# Patient Record
Sex: Female | Born: 1935 | Race: White | Hispanic: No | Smoking: Never smoker
Health system: Southern US, Community
[De-identification: ages and names within clinical notes are randomized; demographics above are authoritative.]

## PROBLEM LIST (undated history)

## (undated) DIAGNOSIS — C189 Malignant neoplasm of colon, unspecified: Secondary | ICD-10-CM

## (undated) DIAGNOSIS — H409 Unspecified glaucoma: Secondary | ICD-10-CM

## (undated) DIAGNOSIS — I639 Cerebral infarction, unspecified: Secondary | ICD-10-CM

## (undated) DIAGNOSIS — H3530 Unspecified macular degeneration: Secondary | ICD-10-CM

## (undated) DIAGNOSIS — C44311 Basal cell carcinoma of skin of nose: Secondary | ICD-10-CM

## (undated) DIAGNOSIS — M4800 Spinal stenosis, site unspecified: Secondary | ICD-10-CM

## (undated) DIAGNOSIS — C44729 Squamous cell carcinoma of skin of left lower limb, including hip: Secondary | ICD-10-CM

## (undated) HISTORY — PX: BREAST BIOPSY: SHX20

## (undated) HISTORY — DX: Basal cell carcinoma of skin of nose: C44.311

## (undated) HISTORY — DX: Squamous cell carcinoma of skin of left lower limb, including hip: C44.729

## (undated) HISTORY — DX: Spinal stenosis, site unspecified: M48.00

## (undated) HISTORY — DX: Cerebral infarction, unspecified: I63.9

## (undated) HISTORY — PX: TONSILLECTOMY: SUR1361

---

## 1998-02-03 ENCOUNTER — Ambulatory Visit (HOSPITAL_COMMUNITY): Admission: RE | Admit: 1998-02-03 | Discharge: 1998-02-03 | Payer: Self-pay | Admitting: Obstetrics and Gynecology

## 1998-04-20 ENCOUNTER — Ambulatory Visit (HOSPITAL_COMMUNITY): Admission: RE | Admit: 1998-04-20 | Discharge: 1998-04-20 | Payer: Self-pay | Admitting: Obstetrics & Gynecology

## 1998-10-04 ENCOUNTER — Other Ambulatory Visit: Admission: RE | Admit: 1998-10-04 | Discharge: 1998-10-04 | Payer: Self-pay | Admitting: Obstetrics and Gynecology

## 1999-03-01 ENCOUNTER — Encounter (INDEPENDENT_AMBULATORY_CARE_PROVIDER_SITE_OTHER): Payer: Self-pay

## 1999-03-01 ENCOUNTER — Ambulatory Visit (HOSPITAL_COMMUNITY): Admission: RE | Admit: 1999-03-01 | Discharge: 1999-03-01 | Payer: Self-pay | Admitting: *Deleted

## 1999-10-10 ENCOUNTER — Other Ambulatory Visit: Admission: RE | Admit: 1999-10-10 | Discharge: 1999-10-10 | Payer: Self-pay | Admitting: Obstetrics and Gynecology

## 1999-12-05 ENCOUNTER — Encounter: Admission: RE | Admit: 1999-12-05 | Discharge: 1999-12-05 | Payer: Self-pay | Admitting: Obstetrics and Gynecology

## 1999-12-05 ENCOUNTER — Encounter: Payer: Self-pay | Admitting: Obstetrics and Gynecology

## 2000-12-10 ENCOUNTER — Other Ambulatory Visit: Admission: RE | Admit: 2000-12-10 | Discharge: 2000-12-10 | Payer: Self-pay | Admitting: Obstetrics and Gynecology

## 2000-12-20 ENCOUNTER — Encounter: Payer: Self-pay | Admitting: Obstetrics and Gynecology

## 2000-12-20 ENCOUNTER — Encounter: Admission: RE | Admit: 2000-12-20 | Discharge: 2000-12-20 | Payer: Self-pay | Admitting: Obstetrics and Gynecology

## 2001-03-01 ENCOUNTER — Encounter: Payer: Self-pay | Admitting: Specialist

## 2001-03-04 ENCOUNTER — Ambulatory Visit (HOSPITAL_COMMUNITY): Admission: RE | Admit: 2001-03-04 | Discharge: 2001-03-04 | Payer: Self-pay | Admitting: Specialist

## 2001-09-09 ENCOUNTER — Encounter: Payer: Self-pay | Admitting: Emergency Medicine

## 2001-09-09 ENCOUNTER — Emergency Department (HOSPITAL_COMMUNITY): Admission: EM | Admit: 2001-09-09 | Discharge: 2001-09-09 | Payer: Self-pay | Admitting: Emergency Medicine

## 2002-01-27 ENCOUNTER — Encounter: Payer: Self-pay | Admitting: Obstetrics and Gynecology

## 2002-01-27 ENCOUNTER — Encounter: Admission: RE | Admit: 2002-01-27 | Discharge: 2002-01-27 | Payer: Self-pay | Admitting: Obstetrics and Gynecology

## 2002-03-11 ENCOUNTER — Other Ambulatory Visit: Admission: RE | Admit: 2002-03-11 | Discharge: 2002-03-11 | Payer: Self-pay | Admitting: Obstetrics and Gynecology

## 2003-02-18 ENCOUNTER — Encounter: Payer: Self-pay | Admitting: Obstetrics and Gynecology

## 2003-02-18 ENCOUNTER — Encounter: Admission: RE | Admit: 2003-02-18 | Discharge: 2003-02-18 | Payer: Self-pay | Admitting: Obstetrics and Gynecology

## 2003-03-30 ENCOUNTER — Encounter: Admission: RE | Admit: 2003-03-30 | Discharge: 2003-03-30 | Payer: Self-pay | Admitting: Internal Medicine

## 2003-04-13 ENCOUNTER — Other Ambulatory Visit: Admission: RE | Admit: 2003-04-13 | Discharge: 2003-04-13 | Payer: Self-pay | Admitting: Obstetrics and Gynecology

## 2003-05-15 ENCOUNTER — Encounter: Admission: RE | Admit: 2003-05-15 | Discharge: 2003-05-15 | Payer: Self-pay | Admitting: Obstetrics and Gynecology

## 2004-03-31 ENCOUNTER — Encounter: Admission: RE | Admit: 2004-03-31 | Discharge: 2004-03-31 | Payer: Self-pay | Admitting: Obstetrics and Gynecology

## 2004-04-11 ENCOUNTER — Encounter: Admission: RE | Admit: 2004-04-11 | Discharge: 2004-04-11 | Payer: Self-pay | Admitting: Obstetrics and Gynecology

## 2004-05-11 ENCOUNTER — Other Ambulatory Visit: Admission: RE | Admit: 2004-05-11 | Discharge: 2004-05-11 | Payer: Self-pay | Admitting: Obstetrics and Gynecology

## 2004-12-07 ENCOUNTER — Encounter: Admission: RE | Admit: 2004-12-07 | Discharge: 2004-12-07 | Payer: Self-pay | Admitting: Internal Medicine

## 2004-12-14 ENCOUNTER — Encounter: Admission: RE | Admit: 2004-12-14 | Discharge: 2004-12-14 | Payer: Self-pay | Admitting: Internal Medicine

## 2005-02-23 ENCOUNTER — Encounter: Admission: RE | Admit: 2005-02-23 | Discharge: 2005-02-23 | Payer: Self-pay | Admitting: Internal Medicine

## 2005-05-02 ENCOUNTER — Encounter: Admission: RE | Admit: 2005-05-02 | Discharge: 2005-05-02 | Payer: Self-pay | Admitting: Internal Medicine

## 2005-08-11 ENCOUNTER — Other Ambulatory Visit: Admission: RE | Admit: 2005-08-11 | Discharge: 2005-08-11 | Payer: Self-pay | Admitting: Obstetrics and Gynecology

## 2006-06-12 ENCOUNTER — Encounter: Admission: RE | Admit: 2006-06-12 | Discharge: 2006-06-12 | Payer: Self-pay | Admitting: Obstetrics and Gynecology

## 2007-02-12 ENCOUNTER — Encounter: Admission: RE | Admit: 2007-02-12 | Discharge: 2007-02-12 | Payer: Self-pay | Admitting: Internal Medicine

## 2007-03-27 ENCOUNTER — Other Ambulatory Visit: Admission: RE | Admit: 2007-03-27 | Discharge: 2007-03-27 | Payer: Self-pay | Admitting: Obstetrics and Gynecology

## 2007-07-21 ENCOUNTER — Encounter: Admission: RE | Admit: 2007-07-21 | Discharge: 2007-07-21 | Payer: Self-pay | Admitting: Surgery

## 2008-01-20 ENCOUNTER — Other Ambulatory Visit: Admission: RE | Admit: 2008-01-20 | Discharge: 2008-01-20 | Payer: Self-pay | Admitting: Obstetrics and Gynecology

## 2008-04-09 ENCOUNTER — Ambulatory Visit: Payer: Self-pay | Admitting: Internal Medicine

## 2008-04-14 ENCOUNTER — Encounter: Admission: RE | Admit: 2008-04-14 | Discharge: 2008-04-14 | Payer: Self-pay | Admitting: Obstetrics and Gynecology

## 2009-05-05 ENCOUNTER — Encounter: Admission: RE | Admit: 2009-05-05 | Discharge: 2009-05-05 | Payer: Self-pay | Admitting: Obstetrics and Gynecology

## 2010-06-03 ENCOUNTER — Encounter
Admission: RE | Admit: 2010-06-03 | Discharge: 2010-06-03 | Payer: Self-pay | Source: Home / Self Care | Attending: Obstetrics and Gynecology | Admitting: Obstetrics and Gynecology

## 2011-05-12 ENCOUNTER — Other Ambulatory Visit: Payer: Self-pay | Admitting: Obstetrics and Gynecology

## 2011-05-12 DIAGNOSIS — Z1231 Encounter for screening mammogram for malignant neoplasm of breast: Secondary | ICD-10-CM

## 2011-06-19 ENCOUNTER — Ambulatory Visit: Payer: Self-pay

## 2011-06-29 ENCOUNTER — Ambulatory Visit: Payer: Self-pay

## 2011-07-13 ENCOUNTER — Ambulatory Visit: Payer: Self-pay

## 2012-04-11 ENCOUNTER — Ambulatory Visit
Admission: RE | Admit: 2012-04-11 | Discharge: 2012-04-11 | Disposition: A | Discharge status: Home / Self Care | Payer: Medicare Other | Source: Ambulatory Visit | Attending: Obstetrics and Gynecology | Admitting: Obstetrics and Gynecology

## 2012-04-11 DIAGNOSIS — Z1231 Encounter for screening mammogram for malignant neoplasm of breast: Secondary | ICD-10-CM

## 2012-09-09 ENCOUNTER — Ambulatory Visit: Payer: Medicare Other | Admitting: Physical Therapy

## 2013-04-09 ENCOUNTER — Ambulatory Visit: Payer: Medicare Other | Attending: Internal Medicine | Admitting: Rehabilitation

## 2013-04-09 DIAGNOSIS — IMO0001 Reserved for inherently not codable concepts without codable children: Secondary | ICD-10-CM | POA: Insufficient documentation

## 2013-04-09 DIAGNOSIS — R42 Dizziness and giddiness: Secondary | ICD-10-CM | POA: Insufficient documentation

## 2013-04-09 DIAGNOSIS — M6281 Muscle weakness (generalized): Secondary | ICD-10-CM | POA: Insufficient documentation

## 2013-04-14 ENCOUNTER — Ambulatory Visit: Payer: Medicare Other | Admitting: Rehabilitation

## 2013-04-16 ENCOUNTER — Ambulatory Visit: Payer: Medicare Other | Admitting: Rehabilitation

## 2013-04-21 ENCOUNTER — Ambulatory Visit: Payer: Medicare Other | Admitting: Rehabilitation

## 2013-04-23 ENCOUNTER — Ambulatory Visit: Payer: Medicare Other | Admitting: Rehabilitation

## 2013-04-28 ENCOUNTER — Ambulatory Visit: Payer: Medicare Other | Admitting: Rehabilitation

## 2013-04-29 ENCOUNTER — Ambulatory Visit: Payer: Medicare Other | Admitting: Rehabilitation

## 2013-05-06 ENCOUNTER — Ambulatory Visit: Payer: Medicare Other | Attending: Internal Medicine | Admitting: Rehabilitation

## 2013-05-06 DIAGNOSIS — R42 Dizziness and giddiness: Secondary | ICD-10-CM | POA: Insufficient documentation

## 2013-05-06 DIAGNOSIS — IMO0001 Reserved for inherently not codable concepts without codable children: Secondary | ICD-10-CM | POA: Insufficient documentation

## 2013-05-06 DIAGNOSIS — M6281 Muscle weakness (generalized): Secondary | ICD-10-CM | POA: Insufficient documentation

## 2013-05-12 ENCOUNTER — Ambulatory Visit: Payer: Medicare Other | Admitting: Rehabilitation

## 2013-05-14 ENCOUNTER — Ambulatory Visit: Payer: Medicare Other | Admitting: Rehabilitation

## 2013-08-05 ENCOUNTER — Ambulatory Visit: Payer: Self-pay | Admitting: Gynecology

## 2013-08-05 ENCOUNTER — Ambulatory Visit: Payer: Self-pay | Admitting: Obstetrics and Gynecology

## 2013-08-19 ENCOUNTER — Encounter: Payer: Self-pay | Admitting: Obstetrics and Gynecology

## 2013-08-20 ENCOUNTER — Ambulatory Visit: Payer: Self-pay | Admitting: Gynecology

## 2013-09-01 ENCOUNTER — Encounter: Payer: Self-pay | Admitting: Gynecology

## 2013-09-01 ENCOUNTER — Ambulatory Visit (INDEPENDENT_AMBULATORY_CARE_PROVIDER_SITE_OTHER): Payer: Medicare Other | Admitting: Gynecology

## 2013-09-01 VITALS — BP 138/78 | HR 80 | Resp 14 | Ht 61.0 in | Wt 130.0 lb

## 2013-09-01 DIAGNOSIS — Z01419 Encounter for gynecological examination (general) (routine) without abnormal findings: Secondary | ICD-10-CM

## 2013-09-01 NOTE — Progress Notes (Signed)
80 y.o. Widowed Caucasian female   G3P3003 here for annual exam.  She does not report post-menopasual bleeding.  Not sexually active.  Pt reports rash under breasts in past has had 3x, use, used -zeasorb with good results-recommend by Dr Ronnald Ramp.  Pt reports 2 bumps on labia, itches wear hair is, using otc lotion.    No LMP recorded.          Sexually active: no  The current method of family planning is post menopausal status.    Exercising: yes  Physical Therapy Sporadically Last pap: 03/08/13 Negative  Abnormal PAP: no Mammogram: 04/12/12 Bi-Rads 1 BSE: yes mostly  Colonoscopy: 2010 Normal  DEXA: 04/2008 -1.7/2.5  Alcohol: glass of wine occasionally   Tobacco: no  Labs: RichardPang, MD   Health Maintenance  Topic Date Due  . Tetanus/tdap  07/13/1946  . Colonoscopy  07/13/1977  . Zostavax  07/14/1987  . Pneumococcal Polysaccharide Vaccine Age 6 And Over  07/13/1992  . Influenza Vaccine  01/03/2013    Family History  Problem Relation Age of Onset  . Breast cancer Maternal Aunt   . Ovarian cancer Sister   . Heart disease Father   . Ulcers Father     There are no active problems to display for this patient.   Past Medical History  Diagnosis Date  . Basal cell carcinoma of nose 2005  . Squamous cell carcinoma of left hip 2005  . Hyperchloremia     Past Surgical History  Procedure Laterality Date  . Breast biopsy  1950's ; 1952 ; 1954    x 3 - Benign    Allergies: Review of patient's allergies indicates no known allergies.  Current Outpatient Prescriptions  Medication Sig Dispense Refill  . ALPRAZolam (XANAX) 0.25 MG tablet       . aspirin 81 MG tablet Take 81 mg by mouth daily.      Marland Kitchen CALCIUM PO Take by mouth.      . cholecalciferol (VITAMIN D) 1000 UNITS tablet Take 1,000 Units by mouth daily.      . cycloSPORINE (RESTASIS) 0.05 % ophthalmic emulsion 1 drop 2 (two) times daily.      Marland Kitchen ibandronate (BONIVA) 150 MG tablet Take 150 mg by mouth every 30 (thirty)  days. Take in the morning with a full glass of water, on an empty stomach, and do not take anything else by mouth or lie down for the next 30 min.      Marland Kitchen losartan-hydrochlorothiazide (HYZAAR) 100-12.5 MG per tablet       . Meloxicam (MOBIC PO) Take by mouth.      . metoprolol tartrate (LOPRESSOR) 25 MG tablet       . Multiple Vitamins-Minerals (EYE VITAMINS PO) Take by mouth.      . Omega-3 Fatty Acids (FISH OIL PO) Take by mouth.      . traMADol (ULTRAM) 50 MG tablet        No current facility-administered medications for this visit.    ROS: Pertinent items are noted in HPI.  Exam:    BP 138/78  Pulse 80  Resp 14  Ht 5\' 1"  (1.549 m)  Wt 130 lb (58.968 kg)  BMI 24.58 kg/m2 Weight change: @WEIGHTCHANGE @ Last 3 height recordings:  Ht Readings from Last 3 Encounters:  09/01/13 5\' 1"  (1.549 m)   General appearance: alert, cooperative and appears stated age Head: Normocephalic, without obvious abnormality, atraumatic Neck: no adenopathy, no carotid bruit, no JVD, supple, symmetrical, trachea midline and thyroid  not enlarged, symmetric, no tenderness/mass/nodules Lungs: clear to auscultation bilaterally Breasts: normal appearance, no masses or tenderness Heart: regular rate and rhythm, S1, S2 normal, no murmur, click, rub or gallop Abdomen: soft, non-tender; bowel sounds normal; no masses,  no organomegaly Extremities: extremities normal, atraumatic, no cyanosis or edema Skin: Skin color, texture, turgor normal. No rashes or lesions Lymph nodes: Cervical, supraclavicular, and axillary nodes normal. no inguinal nodes palpated Neurologic: Grossly normal   Pelvic: External genitalia:  Sebaceous cysts noted on left labia, no excoriations or other lesions              Urethra: normal appearing urethra with no masses, tenderness or lesions              Bartholins and Skenes: normal                 Vagina: atrophic              Cervix: normal appearance              Pap taken: no         Bimanual Exam:  Uterus:  Small                                       Adnexa:    no masses                                      Rectovaginal: Confirms                                      Anus:  normal sphincter tone, no lesions  A: well woman Atrophic vulvitis     P: mammogram counseled on breast self exam, mammography screening, osteoporosis, adequate intake of calcium and vitamin D, diet and exercise A&D to vulva, keep dry  return annually or prn Discussed PAP guideline changes, importance of weight bearing exercises, calcium, vit D and balanced diet.  An After Visit Summary was printed and given to the patient.

## 2013-09-01 NOTE — Patient Instructions (Signed)

## 2014-04-06 ENCOUNTER — Encounter: Payer: Self-pay | Admitting: Gynecology

## 2014-05-05 ENCOUNTER — Telehealth: Payer: Self-pay | Admitting: Gynecology

## 2014-05-05 NOTE — Telephone Encounter (Signed)
Left message regarding upcoming appointment has been canceled and needs to be rescheduled. °

## 2014-06-08 DIAGNOSIS — H35351 Cystoid macular degeneration, right eye: Secondary | ICD-10-CM | POA: Diagnosis not present

## 2014-06-08 DIAGNOSIS — H35363 Drusen (degenerative) of macula, bilateral: Secondary | ICD-10-CM | POA: Diagnosis not present

## 2014-06-08 DIAGNOSIS — H3531 Nonexudative age-related macular degeneration: Secondary | ICD-10-CM | POA: Diagnosis not present

## 2014-06-11 DIAGNOSIS — L219 Seborrheic dermatitis, unspecified: Secondary | ICD-10-CM | POA: Diagnosis not present

## 2014-06-11 DIAGNOSIS — L309 Dermatitis, unspecified: Secondary | ICD-10-CM | POA: Diagnosis not present

## 2014-07-30 DIAGNOSIS — J310 Chronic rhinitis: Secondary | ICD-10-CM | POA: Diagnosis not present

## 2014-09-07 ENCOUNTER — Ambulatory Visit: Payer: Medicare Other | Admitting: Gynecology

## 2014-09-14 DIAGNOSIS — H01004 Unspecified blepharitis left upper eyelid: Secondary | ICD-10-CM | POA: Diagnosis not present

## 2014-09-14 DIAGNOSIS — H01002 Unspecified blepharitis right lower eyelid: Secondary | ICD-10-CM | POA: Diagnosis not present

## 2014-09-14 DIAGNOSIS — H01001 Unspecified blepharitis right upper eyelid: Secondary | ICD-10-CM | POA: Diagnosis not present

## 2014-09-14 DIAGNOSIS — H01005 Unspecified blepharitis left lower eyelid: Secondary | ICD-10-CM | POA: Diagnosis not present

## 2014-10-08 DIAGNOSIS — I10 Essential (primary) hypertension: Secondary | ICD-10-CM | POA: Diagnosis not present

## 2014-10-08 DIAGNOSIS — M810 Age-related osteoporosis without current pathological fracture: Secondary | ICD-10-CM | POA: Diagnosis not present

## 2014-10-12 DIAGNOSIS — Z0000 Encounter for general adult medical examination without abnormal findings: Secondary | ICD-10-CM | POA: Diagnosis not present

## 2014-10-12 DIAGNOSIS — I10 Essential (primary) hypertension: Secondary | ICD-10-CM | POA: Diagnosis not present

## 2014-10-12 DIAGNOSIS — E559 Vitamin D deficiency, unspecified: Secondary | ICD-10-CM | POA: Diagnosis not present

## 2014-10-12 DIAGNOSIS — J301 Allergic rhinitis due to pollen: Secondary | ICD-10-CM | POA: Diagnosis not present

## 2014-10-13 DIAGNOSIS — M13842 Other specified arthritis, left hand: Secondary | ICD-10-CM | POA: Diagnosis not present

## 2014-10-13 DIAGNOSIS — K5900 Constipation, unspecified: Secondary | ICD-10-CM | POA: Diagnosis not present

## 2014-10-13 DIAGNOSIS — M810 Age-related osteoporosis without current pathological fracture: Secondary | ICD-10-CM | POA: Diagnosis not present

## 2014-10-13 DIAGNOSIS — M13841 Other specified arthritis, right hand: Secondary | ICD-10-CM | POA: Diagnosis not present

## 2014-10-13 DIAGNOSIS — M19049 Primary osteoarthritis, unspecified hand: Secondary | ICD-10-CM | POA: Diagnosis not present

## 2014-10-19 ENCOUNTER — Ambulatory Visit: Payer: Medicare Other | Admitting: Gynecology

## 2014-10-21 DIAGNOSIS — H3532 Exudative age-related macular degeneration: Secondary | ICD-10-CM | POA: Diagnosis not present

## 2014-10-21 DIAGNOSIS — H52202 Unspecified astigmatism, left eye: Secondary | ICD-10-CM | POA: Diagnosis not present

## 2014-10-21 DIAGNOSIS — H26492 Other secondary cataract, left eye: Secondary | ICD-10-CM | POA: Diagnosis not present

## 2014-11-05 DIAGNOSIS — H2640 Unspecified secondary cataract: Secondary | ICD-10-CM | POA: Diagnosis not present

## 2014-11-05 DIAGNOSIS — H26492 Other secondary cataract, left eye: Secondary | ICD-10-CM | POA: Diagnosis not present

## 2015-01-13 ENCOUNTER — Ambulatory Visit (INDEPENDENT_AMBULATORY_CARE_PROVIDER_SITE_OTHER): Payer: Medicare Other | Admitting: Obstetrics and Gynecology

## 2015-01-13 ENCOUNTER — Encounter: Payer: Self-pay | Admitting: Obstetrics and Gynecology

## 2015-01-13 VITALS — BP 118/70 | HR 102 | Resp 14 | Ht 59.25 in | Wt 112.0 lb

## 2015-01-13 DIAGNOSIS — R1032 Left lower quadrant pain: Secondary | ICD-10-CM

## 2015-01-13 DIAGNOSIS — R350 Frequency of micturition: Secondary | ICD-10-CM

## 2015-01-13 DIAGNOSIS — R3915 Urgency of urination: Secondary | ICD-10-CM

## 2015-01-13 DIAGNOSIS — M8580 Other specified disorders of bone density and structure, unspecified site: Secondary | ICD-10-CM | POA: Diagnosis not present

## 2015-01-13 DIAGNOSIS — Z01419 Encounter for gynecological examination (general) (routine) without abnormal findings: Secondary | ICD-10-CM

## 2015-01-13 DIAGNOSIS — Z0000 Encounter for general adult medical examination without abnormal findings: Secondary | ICD-10-CM | POA: Diagnosis not present

## 2015-01-13 LAB — POCT URINALYSIS DIPSTICK
Bilirubin, UA: NEGATIVE
Blood, UA: NEGATIVE
Glucose, UA: NEGATIVE
Ketones, UA: NEGATIVE
Leukocytes, UA: NEGATIVE
Nitrite, UA: NEGATIVE
Protein, UA: NEGATIVE
Spec Grav, UA: 1.015
Urobilinogen, UA: NEGATIVE
pH, UA: 7

## 2015-01-13 NOTE — Progress Notes (Addendum)
Patient ID: Katherine Cooke, female   DOB: 03/06/1926, 81 y.o.   MRN: [id number] 81 y.o. G3P3003 WidowedCaucasianF here for annual exam.  Pt c/o pain in her left lower abdomen, intermittently. Worse if she is constipated, she notices a bulge in the LLQ in the area of the pain. She saw her primary last week, he told her to start miralax, she will start it today. Her pain occurs daily, lasts for several hours, hard to describe. She is having a BM daily, taking magnesium. If she gets constipated the pain is worse. She has hemorrhoids, only see's blood if she is constipated. In the last year she has noticed increased frequency and urgency of urination, no dysuria. Nocturia x 1, not every night. Rare urge incontinence. Stream is slower. She has been on Metoprolol having a cough, felt to be a side effect. She is weaning off it. Staying on the Losartan.   No LMP recorded. Patient is not currently having periods (Reason: Premenopausal).          Sexually active: No.  The current method of family planning is post menopausal status.    Exercising: No.  The patient does not participate in regular exercise at present. Smoker:  no  Health Maintenance: Pap: 03-08-09 WNL  History of abnormal Pap:  no MMG:  04-12-12 WNL  Colonoscopy:  2010 WNL  BMD:   11/2014  Osteopenia "a little better" on Boniva TDaP:  Unsure  Screening Labs: , Hb today: labs with primary, Urine today: WNL    reports that she has never smoked. She has never used smokeless tobacco. She reports that she drinks about 4.2 oz of alcohol per week. She reports that she does not use illicit drugs.  Past Medical History  Diagnosis Date  . Basal cell carcinoma of nose 2005  . Squamous cell carcinoma of left hip 2005  . Hypertension   . Spinal stenosis     Past Surgical History  Procedure Laterality Date  . Breast biopsy  1950's ; 1952 ; 1954    x 3 - Benign    Current Outpatient Prescriptions  Medication Sig Dispense Refill  . ALPRAZolam  (XANAX) 0.25 MG tablet as needed.     . Ascorbic Acid (VITAMIN C PO) Take by mouth.    Marland Kitchen aspirin 81 MG tablet Take 81 mg by mouth daily.    Marland Kitchen CALCIUM PO Take by mouth.    . cholecalciferol (VITAMIN D) 1000 UNITS tablet Take 1,000 Units by mouth daily.    . cycloSPORINE (RESTASIS) 0.05 % ophthalmic emulsion 1 drop 2 (two) times daily.    Marland Kitchen ibandronate (BONIVA) 150 MG tablet Take 150 mg by mouth every 30 (thirty) days. Take in the morning with a full glass of water, on an empty stomach, and do not take anything else by mouth or lie down for the next 30 min.    Marland Kitchen losartan-hydrochlorothiazide (HYZAAR) 100-12.5 MG per tablet     . MAGNESIUM PO Take by mouth.    . Multiple Vitamins-Minerals (EYE VITAMINS PO) Take by mouth.    . Omega-3 Fatty Acids (FISH OIL PO) Take by mouth.    . triazolam (HALCION) 0.25 MG tablet TK 1 T PO  QHS PRF SLEEP  2  . ipratropium (ATROVENT) 0.06 % nasal spray      No current facility-administered medications for this visit.    Family History  Problem Relation Age of Onset  . Breast cancer Maternal Aunt   . Ovarian cancer Sister   .  Heart disease Father   . Ulcers Father     ROS:  Pertinent items are noted in HPI.  Some muscle aches, has moles, occasionally itchy. Hair loss. Otherwise, a comprehensive ROS was negative.  Exam:   BP 118/70 mmHg  Pulse 102  Resp 14  Ht 4' 11.25" (1.505 m)  Wt 112 lb (50.803 kg)  BMI 22.43 kg/m2  Weight change: '@WEIGHTCHANGE'$ @ Height:   Height: 4' 11.25" (150.5 cm)  Ht Readings from Last 3 Encounters:  01/13/15 4' 11.25" (1.505 m)  09/01/13 '5\' 1"'$  (1.549 m)    General appearance: alert, cooperative and appears stated age Head: Normocephalic, without obvious abnormality, atraumatic Neck: no adenopathy, supple, symmetrical, trachea midline and thyroid normal to inspection and palpation Lungs: clear to auscultation bilaterally Breasts: normal appearance, no masses or tenderness Heart: regular rate and rhythm Abdomen: soft,  distended, mildly tender in the LLQ. With tensing of her abdomen and with standing there is a bulge in her LLQ, slightly tender to palpation, suspect ventral hernia Extremities: extremities normal, atraumatic, no cyanosis or edema Skin: Skin color, texture, turgor normal. No rashes or lesions Lymph nodes: Cervical, supraclavicular, and axillary nodes normal. No abnormal inguinal nodes palpated Neurologic: Grossly normal   Pelvic: External genitalia:  no lesions              Urethra:  normal appearing urethra with no masses, tenderness or lesions              Bartholins and Skenes: normal                 Vagina: severe atrophy              Cervix: no lesions              Pap taken: No. Bimanual Exam:  Uterus:  normal size, contour, position, consistency, mobility, non-tender              Adnexa: no mass, fullness, tenderness               Rectovaginal: Confirms               Anus:  normal sphincter tone, no lesions  Chaperone was present for exam.  A:  Well Woman with normal exam  LLQ abdominal pain  Abdominal wall bulge   P:   No paps needed  Mammogram overdue  Send to General Surgery, ? Hernia in her LLQ  Urine for ua, c&s  Addendum: urine being sent for urinary frequency and urgency.

## 2015-01-13 NOTE — Patient Instructions (Addendum)
Please make an appointment for your mammogram  EXERCISE AND DIET:  We recommended that you start or continue a regular exercise program for good health. Regular exercise means any activity that makes your heart beat faster and makes you sweat.  We recommend exercising at least 30 minutes per day at least 3 days a week, preferably 4 or 5.  We also recommend a diet low in fat and sugar.  Inactivity, poor dietary choices and obesity can cause diabetes, heart attack, stroke, and kidney damage, among others.    ALCOHOL AND SMOKING:  Women should limit their alcohol intake to no more than 7 drinks/beers/glasses of wine (combined, not each!) per week. Moderation of alcohol intake to this level decreases your risk of breast cancer and liver damage. And of course, no recreational drugs are part of a healthy lifestyle.  And absolutely no smoking or even second hand smoke. Most people know smoking can cause heart and lung diseases, but did you know it also contributes to weakening of your bones? Aging of your skin?  Yellowing of your teeth and nails?  CALCIUM AND VITAMIN D:  Adequate intake of calcium and Vitamin D are recommended.  The recommendations for exact amounts of these supplements seem to change often, but generally speaking 600 mg of calcium (either carbonate or citrate) and 800 units of Vitamin D per day seems prudent. Certain women may benefit from higher intake of Vitamin D.  If you are among these women, your doctor will have told you during your visit.    PAP SMEARS:  Pap smears, to check for cervical cancer or precancers,  have traditionally been done yearly, although recent scientific advances have shown that most women can have pap smears less often.  However, every woman still should have a physical exam from her gynecologist every year. It will include a breast check, inspection of the vulva and vagina to check for abnormal growths or skin changes, a visual exam of the cervix, and then an exam to  evaluate the size and shape of the uterus and ovaries.  And after 79 years of age, a rectal exam is indicated to check for rectal cancers. We will also provide age appropriate advice regarding health maintenance, like when you should have certain vaccines, screening for sexually transmitted diseases, bone density testing, colonoscopy, mammograms, etc.   MAMMOGRAMS:  All women over 93 years old should have a yearly mammogram. Many facilities now offer a "3D" mammogram, which may cost around $50 extra out of pocket. If possible,  we recommend you accept the option to have the 3D mammogram performed.  It both reduces the number of women who will be called back for extra views which then turn out to be normal, and it is better than the routine mammogram at detecting truly abnormal areas.    COLONOSCOPY:  Colonoscopy to screen for colon cancer is recommended for all women at age 86.  We know, you hate the idea of the prep.  We agree, BUT, having colon cancer and not knowing it is worse!!  Colon cancer so often starts as a polyp that can be seen and removed at colonscopy, which can quite literally save your life!  And if your first colonoscopy is normal and you have no family history of colon cancer, most women don't have to have it again for 10 years.  Once every ten years, you can do something that may end up saving your life, right?  We will be happy to help  you get it scheduled when you are ready.  Be sure to check your insurance coverage so you understand how much it will cost.  It may be covered as a preventative service at no cost, but you should check your particular policy.     Kegel Exercises The goal of Kegel exercises is to isolate and exercise your pelvic floor muscles. These muscles act as a hammock that supports the rectum, vagina, small intestine, and uterus. As the muscles weaken, the hammock sags and these organs are displaced from their normal positions. Kegel exercises can strengthen your  pelvic floor muscles and help you to improve bladder and bowel control, improve sexual response, and help reduce many problems and some discomfort during pregnancy. Kegel exercises can be done anywhere and at any time. HOW TO PERFORM KEGEL EXERCISES 1. Locate your pelvic floor muscles. To do this, squeeze (contract) the muscles that you use when you try to stop the flow of urine. You will feel a tightness in the vaginal area (women) and a tight lift in the rectal area (men and women). 2. When you begin, contract your pelvic muscles tight for 2-5 seconds, then relax them for 2-5 seconds. This is one set. Do 4-5 sets with a short pause in between. 3. Contract your pelvic muscles for 8-10 seconds, then relax them for 8-10 seconds. Do 4-5 sets. If you cannot contract your pelvic muscles for 8-10 seconds, try 5-7 seconds and work your way up to 8-10 seconds. Your goal is 4-5 sets of 10 contractions each day. Keep your stomach, buttocks, and legs relaxed during the exercises. Perform sets of both short and long contractions. Vary your positions. Perform these contractions 3-4 times per day. Perform sets while you are:   Lying in bed in the morning.  Standing at lunch.  Sitting in the late afternoon.  Lying in bed at night. You should do 40-50 contractions per day. Do not perform more Kegel exercises per day than recommended. Overexercising can cause muscle fatigue. Continue these exercises for for at least 15-20 weeks or as directed by your caregiver. Document Released: 05/08/2012 Document Reviewed: 05/08/2012 [facility] Patient Information 2015 [place]. This information is not intended to replace advice given to you by your health care provider. Make sure you discuss any questions you have with your health care provider.

## 2015-01-14 LAB — URINALYSIS, MICROSCOPIC ONLY
Bacteria, UA: NONE SEEN [HPF]
Casts: NONE SEEN [LPF]
Crystals: NONE SEEN [HPF]
RBC / HPF: NONE SEEN RBC/HPF (ref <=2)
Squamous Epithelial / HPF: NONE SEEN [HPF] (ref <=5)
WBC, UA: NONE SEEN WBC/HPF (ref <=5)
Yeast: NONE SEEN [HPF]

## 2015-01-15 LAB — URINE CULTURE
Colony Count: NO GROWTH
Organism ID, Bacteria: NO GROWTH

## 2015-01-18 ENCOUNTER — Telehealth: Payer: Self-pay | Admitting: *Deleted

## 2015-01-18 NOTE — Telephone Encounter (Signed)
-----   Message from Salvadore Dom, MD sent at 01/15/2015  4:35 PM EDT ----- Please advise the patient of normal results.

## 2015-01-18 NOTE — Telephone Encounter (Signed)
[place] -eh

## 2015-01-26 ENCOUNTER — Telehealth: Payer: Self-pay | Admitting: Obstetrics and Gynecology

## 2015-01-26 NOTE — Telephone Encounter (Signed)
Spoke with patient. Advised of results as seen below. Patient is agreeable and verbalizes understanding.  Notes Recorded by Nicholes Rough, CMA on 01/26/2015 at 11:21 AM [id number] X 3 -eh will close chart since all results are normal to F/U needed Notes Recorded by Nicholes Rough, CMA on 01/21/2015 at 12:41 PM [facility] -eh Notes Recorded by Nicholes Rough, CMA on 01/18/2015 at 8:55 AM 01-18-15 [facility] -eh Notes Recorded by Salvadore Dom, MD on 01/15/2015 at 4:35 PM Please advise the patient of normal results.  Routing to provider for final review. Patient agreeable to disposition. Will close encounter.   Patient aware provider will review message and nurse will return call if any additional advice or change of disposition.

## 2015-01-26 NOTE — Telephone Encounter (Signed)
Patient says she is returning Katherine Cooke's call.

## 2015-02-13 ENCOUNTER — Encounter (HOSPITAL_COMMUNITY): Payer: Self-pay | Admitting: Emergency Medicine

## 2015-02-13 ENCOUNTER — Emergency Department (HOSPITAL_COMMUNITY): Payer: Medicare Other

## 2015-02-13 ENCOUNTER — Inpatient Hospital Stay (HOSPITAL_COMMUNITY)
Admission: EM | Admit: 2015-02-13 | Discharge: 2015-02-22 | DRG: 374 | Disposition: A | Discharge status: Rehabilitation Facility | Payer: Medicare Other | Attending: Family Medicine | Admitting: Family Medicine

## 2015-02-13 DIAGNOSIS — D6859 Other primary thrombophilia: Secondary | ICD-10-CM | POA: Diagnosis present

## 2015-02-13 DIAGNOSIS — I2510 Atherosclerotic heart disease of native coronary artery without angina pectoris: Secondary | ICD-10-CM | POA: Diagnosis present

## 2015-02-13 DIAGNOSIS — O2230 Deep phlebothrombosis in pregnancy, unspecified trimester: Secondary | ICD-10-CM

## 2015-02-13 DIAGNOSIS — I639 Cerebral infarction, unspecified: Secondary | ICD-10-CM | POA: Diagnosis not present

## 2015-02-13 DIAGNOSIS — R1032 Left lower quadrant pain: Secondary | ICD-10-CM | POA: Diagnosis present

## 2015-02-13 DIAGNOSIS — K6389 Other specified diseases of intestine: Secondary | ICD-10-CM | POA: Diagnosis not present

## 2015-02-13 DIAGNOSIS — C801 Malignant (primary) neoplasm, unspecified: Secondary | ICD-10-CM | POA: Diagnosis present

## 2015-02-13 DIAGNOSIS — E611 Iron deficiency: Secondary | ICD-10-CM | POA: Diagnosis present

## 2015-02-13 DIAGNOSIS — D638 Anemia in other chronic diseases classified elsewhere: Secondary | ICD-10-CM

## 2015-02-13 DIAGNOSIS — E871 Hypo-osmolality and hyponatremia: Secondary | ICD-10-CM | POA: Diagnosis present

## 2015-02-13 DIAGNOSIS — I63449 Cerebral infarction due to embolism of unspecified cerebellar artery: Secondary | ICD-10-CM | POA: Diagnosis not present

## 2015-02-13 DIAGNOSIS — M4800 Spinal stenosis, site unspecified: Secondary | ICD-10-CM | POA: Diagnosis present

## 2015-02-13 DIAGNOSIS — I10 Essential (primary) hypertension: Secondary | ICD-10-CM | POA: Diagnosis present

## 2015-02-13 DIAGNOSIS — H3530 Unspecified macular degeneration: Secondary | ICD-10-CM | POA: Diagnosis present

## 2015-02-13 DIAGNOSIS — M79609 Pain in unspecified limb: Secondary | ICD-10-CM | POA: Diagnosis not present

## 2015-02-13 DIAGNOSIS — C19 Malignant neoplasm of rectosigmoid junction: Secondary | ICD-10-CM | POA: Diagnosis not present

## 2015-02-13 DIAGNOSIS — Z6820 Body mass index (BMI) 20.0-20.9, adult: Secondary | ICD-10-CM

## 2015-02-13 DIAGNOSIS — R531 Weakness: Secondary | ICD-10-CM

## 2015-02-13 DIAGNOSIS — R4789 Other speech disturbances: Secondary | ICD-10-CM

## 2015-02-13 DIAGNOSIS — I959 Hypotension, unspecified: Secondary | ICD-10-CM | POA: Diagnosis present

## 2015-02-13 DIAGNOSIS — Z7982 Long term (current) use of aspirin: Secondary | ICD-10-CM

## 2015-02-13 DIAGNOSIS — R410 Disorientation, unspecified: Secondary | ICD-10-CM

## 2015-02-13 DIAGNOSIS — C785 Secondary malignant neoplasm of large intestine and rectum: Secondary | ICD-10-CM | POA: Diagnosis present

## 2015-02-13 DIAGNOSIS — H409 Unspecified glaucoma: Secondary | ICD-10-CM | POA: Diagnosis present

## 2015-02-13 DIAGNOSIS — R4182 Altered mental status, unspecified: Secondary | ICD-10-CM

## 2015-02-13 DIAGNOSIS — E43 Unspecified severe protein-calorie malnutrition: Secondary | ICD-10-CM | POA: Diagnosis present

## 2015-02-13 DIAGNOSIS — D509 Iron deficiency anemia, unspecified: Secondary | ICD-10-CM | POA: Diagnosis not present

## 2015-02-13 DIAGNOSIS — M1990 Unspecified osteoarthritis, unspecified site: Secondary | ICD-10-CM | POA: Diagnosis present

## 2015-02-13 DIAGNOSIS — E860 Dehydration: Secondary | ICD-10-CM | POA: Diagnosis present

## 2015-02-13 DIAGNOSIS — E119 Type 2 diabetes mellitus without complications: Secondary | ICD-10-CM | POA: Diagnosis present

## 2015-02-13 DIAGNOSIS — M8580 Other specified disorders of bone density and structure, unspecified site: Secondary | ICD-10-CM | POA: Diagnosis present

## 2015-02-13 DIAGNOSIS — C3490 Malignant neoplasm of unspecified part of unspecified bronchus or lung: Secondary | ICD-10-CM

## 2015-02-13 DIAGNOSIS — D630 Anemia in neoplastic disease: Secondary | ICD-10-CM | POA: Diagnosis present

## 2015-02-13 DIAGNOSIS — N179 Acute kidney failure, unspecified: Secondary | ICD-10-CM | POA: Diagnosis present

## 2015-02-13 DIAGNOSIS — Z85828 Personal history of other malignant neoplasm of skin: Secondary | ICD-10-CM

## 2015-02-13 DIAGNOSIS — D72829 Elevated white blood cell count, unspecified: Secondary | ICD-10-CM | POA: Diagnosis present

## 2015-02-13 DIAGNOSIS — R270 Ataxia, unspecified: Secondary | ICD-10-CM | POA: Diagnosis not present

## 2015-02-13 DIAGNOSIS — R29810 Facial weakness: Secondary | ICD-10-CM | POA: Diagnosis not present

## 2015-02-13 DIAGNOSIS — Z7983 Long term (current) use of bisphosphonates: Secondary | ICD-10-CM

## 2015-02-13 DIAGNOSIS — I6340 Cerebral infarction due to embolism of unspecified cerebral artery: Secondary | ICD-10-CM | POA: Diagnosis not present

## 2015-02-13 HISTORY — DX: Malignant neoplasm of colon, unspecified: C18.9

## 2015-02-13 HISTORY — DX: Unspecified macular degeneration: H35.30

## 2015-02-13 HISTORY — DX: Unspecified glaucoma: H40.9

## 2015-02-13 LAB — CBC
HCT: 33.2 % — ABNORMAL LOW (ref 36.0–46.0)
HEMOGLOBIN: 11.3 g/dL — AB (ref 12.0–15.0)
MCH: 33.4 pg (ref 26.0–34.0)
MCHC: 34 g/dL (ref 30.0–36.0)
MCV: 98.2 fL (ref 78.0–100.0)
PLATELETS: 472 10*3/uL — AB (ref 150–400)
RBC: 3.38 MIL/uL — AB (ref 3.87–5.11)
RDW: 12.9 % (ref 11.5–15.5)
WBC: 16.3 10*3/uL — ABNORMAL HIGH (ref 4.0–10.5)

## 2015-02-13 LAB — COMPREHENSIVE METABOLIC PANEL
ALK PHOS: 86 U/L (ref 38–126)
ALT: 13 U/L — AB (ref 14–54)
ANION GAP: 9 (ref 5–15)
AST: 22 U/L (ref 15–41)
Albumin: 3.1 g/dL — ABNORMAL LOW (ref 3.5–5.0)
BILIRUBIN TOTAL: 0.8 mg/dL (ref 0.3–1.2)
BUN: 19 mg/dL (ref 6–20)
CALCIUM: 9 mg/dL (ref 8.9–10.3)
CO2: 26 mmol/L (ref 22–32)
CREATININE: 1.15 mg/dL — AB (ref 0.44–1.00)
Chloride: 96 mmol/L — ABNORMAL LOW (ref 101–111)
GFR, EST AFRICAN AMERICAN: 48 mL/min — AB (ref >60)
GFR, EST NON AFRICAN AMERICAN: 42 mL/min — AB (ref >60)
Glucose, Bld: 105 mg/dL — ABNORMAL HIGH (ref 65–99)
Potassium: 4.1 mmol/L (ref 3.5–5.1)
Sodium: 131 mmol/L — ABNORMAL LOW (ref 135–145)
TOTAL PROTEIN: 7.1 g/dL (ref 6.5–8.1)

## 2015-02-13 LAB — URINE MICROSCOPIC-ADD ON

## 2015-02-13 LAB — URINALYSIS, ROUTINE W REFLEX MICROSCOPIC
Bilirubin Urine: NEGATIVE
GLUCOSE, UA: NEGATIVE mg/dL
HGB URINE DIPSTICK: NEGATIVE
KETONES UR: NEGATIVE mg/dL
Nitrite: NEGATIVE
PROTEIN: NEGATIVE mg/dL
Specific Gravity, Urine: 1.016 (ref 1.005–1.030)
UROBILINOGEN UA: 0.2 mg/dL (ref 0.0–1.0)
pH: 7 (ref 5.0–8.0)

## 2015-02-13 LAB — I-STAT CG4 LACTIC ACID, ED: Lactic Acid, Venous: 0.6 mmol/L (ref 0.5–2.0)

## 2015-02-13 LAB — LIPASE, BLOOD: Lipase: 18 U/L — ABNORMAL LOW (ref 22–51)

## 2015-02-13 MED ORDER — DEXTROSE-NACL 5-0.9 % IV SOLN
INTRAVENOUS | Status: AC
  Administered 2015-02-13: 23:00:00 via INTRAVENOUS

## 2015-02-13 MED ORDER — CIPROFLOXACIN HCL 500 MG PO TABS
500.0000 mg | ORAL_TABLET | Freq: Once | ORAL | Status: AC
  Administered 2015-02-13: 500 mg via ORAL
  Filled 2015-02-13: qty 1

## 2015-02-13 MED ORDER — MORPHINE SULFATE (PF) 4 MG/ML IV SOLN
1.0000 mg | INTRAVENOUS | Status: DC | PRN
  Administered 2015-02-13 – 2015-02-22 (×5): 1 mg via INTRAVENOUS
  Filled 2015-02-13 (×5): qty 1

## 2015-02-13 MED ORDER — TRIAZOLAM 0.25 MG PO TABS
0.2500 mg | ORAL_TABLET | Freq: Every evening | ORAL | Status: DC | PRN
  Administered 2015-02-13 – 2015-02-14 (×2): 0.25 mg via ORAL
  Filled 2015-02-13 (×2): qty 1

## 2015-02-13 MED ORDER — METRONIDAZOLE 500 MG PO TABS
500.0000 mg | ORAL_TABLET | Freq: Once | ORAL | Status: AC
  Administered 2015-02-13: 500 mg via ORAL
  Filled 2015-02-13: qty 1

## 2015-02-13 MED ORDER — MORPHINE SULFATE (PF) 4 MG/ML IV SOLN
3.0000 mg | Freq: Once | INTRAVENOUS | Status: AC
  Administered 2015-02-13: 3 mg via INTRAVENOUS
  Filled 2015-02-13: qty 1

## 2015-02-13 MED ORDER — LOSARTAN POTASSIUM 50 MG PO TABS
100.0000 mg | ORAL_TABLET | Freq: Every day | ORAL | Status: DC
  Administered 2015-02-14: 100 mg via ORAL
  Filled 2015-02-13: qty 2

## 2015-02-13 MED ORDER — CYCLOSPORINE 0.05 % OP EMUL
1.0000 [drp] | Freq: Two times a day (BID) | OPHTHALMIC | Status: DC
  Administered 2015-02-13 – 2015-02-22 (×16): 1 [drp] via OPHTHALMIC
  Filled 2015-02-13 (×21): qty 1

## 2015-02-13 MED ORDER — HYDROCHLOROTHIAZIDE 12.5 MG PO CAPS
12.5000 mg | ORAL_CAPSULE | Freq: Every day | ORAL | Status: DC
  Administered 2015-02-14: 12.5 mg via ORAL
  Filled 2015-02-13: qty 1

## 2015-02-13 MED ORDER — MONTELUKAST SODIUM 10 MG PO TABS
10.0000 mg | ORAL_TABLET | Freq: Every day | ORAL | Status: DC
  Administered 2015-02-13 – 2015-02-21 (×9): 10 mg via ORAL
  Filled 2015-02-13 (×9): qty 1

## 2015-02-13 MED ORDER — ONDANSETRON HCL 4 MG/2ML IJ SOLN: 4.0000 mg | Freq: Four times a day (QID) | INTRAMUSCULAR | Status: AC | PRN

## 2015-02-13 MED ORDER — ONDANSETRON HCL 4 MG/2ML IJ SOLN
4.0000 mg | Freq: Once | INTRAMUSCULAR | Status: AC
  Administered 2015-02-13: 4 mg via INTRAVENOUS
  Filled 2015-02-13: qty 2

## 2015-02-13 MED ORDER — IOHEXOL 300 MG/ML  SOLN
100.0000 mL | Freq: Once | INTRAMUSCULAR | Status: AC | PRN
  Administered 2015-02-13: 75 mL via INTRAVENOUS

## 2015-02-13 MED ORDER — ONDANSETRON HCL 4 MG/2ML IJ SOLN: 4.0000 mg | Freq: Three times a day (TID) | INTRAMUSCULAR | Status: DC | PRN

## 2015-02-13 MED ORDER — LOSARTAN POTASSIUM-HCTZ 100-12.5 MG PO TABS: 1.0000 | ORAL_TABLET | Freq: Every day | ORAL | Status: DC

## 2015-02-13 MED ORDER — IOHEXOL 300 MG/ML  SOLN
25.0000 mL | Freq: Once | INTRAMUSCULAR | Status: AC | PRN
Start: 2015-02-13 — End: 2015-02-13
  Administered 2015-02-13: 25 mL via ORAL

## 2015-02-13 MED ORDER — SODIUM CHLORIDE 0.9 % IV BOLUS (SEPSIS)
1000.0000 mL | Freq: Once | INTRAVENOUS | Status: AC
  Administered 2015-02-13: 1000 mL via INTRAVENOUS

## 2015-02-13 MED ORDER — FENTANYL CITRATE (PF) 100 MCG/2ML IJ SOLN
50.0000 ug | Freq: Once | INTRAMUSCULAR | Status: AC
  Administered 2015-02-13: 50 ug via INTRAVENOUS
  Filled 2015-02-13: qty 2

## 2015-02-13 NOTE — ED Provider Notes (Signed)
CSN: [id number]     Arrival date & time 02/13/15  1117 History   First MD Initiated Contact with Patient 02/13/15 1500     Chief Complaint  Patient presents with  . Abdominal Cramping     (Consider location/radiation/quality/duration/timing/severity/associated sxs/prior Treatment) HPI Comments: 81 year old female with past medical history including hypertension, spinal stenosis who presents with abdominal pain. Patient states that she has had a "lump" and left lower quadrant abdominal pain that has been intermittent for the past 5 years. She has had chronic alternating constipation and diarrhea which she has suspected is IBS. She has never had evaluation for these symptoms. Over the past one week, she has had worsening left-sided abdominal pain which is crampy, intermittent, and at times severe. The pain does not radiate. No associated nausea or vomiting. She noticed a very small amount of blood in her stool this morning but does have a history of hemorrhoids. No urinary symptoms. She endorses decreased appetite and early satiety and notes that she has had a 20 pound unintentional weight loss over the past one year. She has also noticed some right lower quadrant abdominal pain for the past day. No fevers, chills, or night sweats. Last bowel movement was today. Patient also notes a five-year history of cough. She was recently evaluated and scoped by ENT and told that she had mild vocal cord dysfunction which is likely the cause of mucous buildup and irritation.  Patient is a 81 y.o. female presenting with cramps. The history is provided by the patient.  Abdominal Cramping    Past Medical History  Diagnosis Date  . Basal cell carcinoma of nose 2005  . Squamous cell carcinoma of left hip 2005  . Hypertension   . Spinal stenosis    Past Surgical History  Procedure Laterality Date  . Breast biopsy  1950's ; 1952 ; 1954    x 3 - Benign   Family History  Problem Relation Age of Onset  .  Breast cancer Maternal Aunt   . Ovarian cancer Sister   . Heart disease Father   . Ulcers Father    Social History  Substance Use Topics  . Smoking status: Never Smoker   . Smokeless tobacco: Never Used  . Alcohol Use: 4.2 oz/week    7 Glasses of wine per week     Comment: occasionally   OB History    Gravida Para Term Preterm AB TAB SAB Ectopic Multiple Living   '3 3 3  '$ 0     3     Review of Systems 10 Systems reviewed and are negative for acute change except as noted in the HPI.    Allergies  Lyrica  Home Medications   Prior to Admission medications   Medication Sig Start Date End Date Taking? Authorizing Provider  ALPRAZolam Duanne Moron) 0.25 MG tablet Take 0.25 mg by mouth daily as needed for anxiety or sleep.  08/01/13  Yes Historical Provider, MD  Ascorbic Acid (VITAMIN C PO) Take 1 tablet by mouth daily.    Yes Historical Provider, MD  aspirin 81 MG tablet Take 81 mg by mouth daily.   Yes Historical Provider, MD  bisacodyl (DULCOLAX) 5 MG EC tablet Take 5 mg by mouth daily as needed for mild constipation or moderate constipation.   Yes Historical Provider, MD  Ca Carbonate-Mag Hydroxide (ROLAIDS PO) Take 2 tablets by mouth 2 (two) times daily as needed (indigestion).   Yes Historical Provider, MD  cholecalciferol (VITAMIN D) 1000 UNITS tablet Take  1,000 Units by mouth daily.   Yes Historical Provider, MD  cycloSPORINE (RESTASIS) 0.05 % ophthalmic emulsion Place 1 drop into both eyes 2 (two) times daily.   Yes Historical Provider, MD  diclofenac sodium (VOLTAREN) 1 % GEL Apply 4 g topically daily as needed (pain).  10/15/14  Yes Historical Provider, MD  ibandronate (BONIVA) 150 MG tablet Take 150 mg by mouth every 30 (thirty) days. Take in the morning with a full glass of water, on an empty stomach, and do not take anything else by mouth or lie down for the next 30 min.   Yes Historical Provider, MD  losartan-hydrochlorothiazide (HYZAAR) 100-12.5 MG per tablet Take 1 tablet by  mouth daily.  07/23/13  Yes Historical Provider, MD  MAGNESIUM PO Take 1 tablet by mouth daily.    Yes Historical Provider, MD  montelukast (SINGULAIR) 10 MG tablet Take 10 mg by mouth at bedtime. 01/25/15  Yes Historical Provider, MD  Multiple Vitamins-Minerals (EYE VITAMINS PO) Take by mouth.   Yes Historical Provider, MD  Omega-3 Fatty Acids (FISH OIL PO) Take 2 capsules by mouth 2 (two) times daily.    Yes Historical Provider, MD  polyethylene glycol (MIRALAX / GLYCOLAX) packet Take 17 g by mouth daily as needed for mild constipation or moderate constipation.   Yes Historical Provider, MD  triazolam (HALCION) 0.25 MG tablet Take 0.25 mg by mouth at bedtime as needed for sleep.  12/10/14  Yes Historical Provider, MD   BP 96/50 mmHg  Pulse 89  Temp(Src) 98 F (36.7 C) (Oral)  Resp 16  SpO2 98% Physical Exam  Constitutional: She is oriented to person, place, and time.  Thin, uncomfortable but in no acute distress  HENT:  Head: Normocephalic and atraumatic.  Mouth/Throat: Oropharynx is clear and moist.  Moist mucous membranes  Eyes: Conjunctivae are normal. Pupils are equal, round, and reactive to light.  Neck: Neck supple.  Cardiovascular: Normal rate, regular rhythm and normal heart sounds.   No murmur heard. Pulmonary/Chest: Effort normal and breath sounds normal.  Abdominal: Soft. Bowel sounds are normal. She exhibits no distension.  Exquisite tenderness to palpation of left lower quadrant without obvious mass; tenderness to right lower quadrant; no rebound or guarding, no peritonitis  Musculoskeletal: She exhibits no edema.  Neurological: She is alert and oriented to person, place, and time.  Fluent speech  Skin: Skin is warm and dry.  Psychiatric: She has a normal mood and affect. Judgment normal.  Pleasant  Nursing note and vitals reviewed.   ED Course  Procedures (including critical care time) Labs Review Labs Reviewed  LIPASE, BLOOD - Abnormal; Notable for the following:     Lipase 18 (*)    All other components within normal limits  COMPREHENSIVE METABOLIC PANEL - Abnormal; Notable for the following:    Sodium 131 (*)    Chloride 96 (*)    Glucose, Bld 105 (*)    Creatinine, Ser 1.15 (*)    Albumin 3.1 (*)    ALT 13 (*)    GFR calc non Af Amer 42 (*)    GFR calc Af Amer 48 (*)    All other components within normal limits  CBC - Abnormal; Notable for the following:    WBC 16.3 (*)    RBC 3.38 (*)    Hemoglobin 11.3 (*)    HCT 33.2 (*)    Platelets 472 (*)    All other components within normal limits  URINALYSIS, ROUTINE W REFLEX MICROSCOPIC (NOT  AT [facility]) - Abnormal; Notable for the following:    Leukocytes, UA SMALL (*)    All other components within normal limits  URINE MICROSCOPIC-ADD ON - Abnormal; Notable for the following:    Casts HYALINE CASTS (*)    All other components within normal limits  I-STAT CG4 LACTIC ACID, ED    Imaging Review Ct Abdomen Pelvis W Contrast  02/13/2015   CLINICAL DATA:  Left lower quadrant pain for 1 week. New right lower quadrant pain. Leukocytosis. 20 pound weight loss over the past 1 year.  EXAM: CT ABDOMEN AND PELVIS WITH CONTRAST  TECHNIQUE: Multidetector CT imaging of the abdomen and pelvis was performed using the standard protocol following bolus administration of intravenous contrast.  CONTRAST:  63m OMNIPAQUE IOHEXOL 300 MG/ML SOLN, 252mOMNIPAQUE IOHEXOL 300 MG/ML SOLN  COMPARISON:  Abdominal radiographs 10/13/2014  FINDINGS: Minimal dependent atelectasis is present in the lung bases. There are several small right lower lobe lung nodules which measure up to 3 mm in size (series 7, image 1). Mitral annular calcification is partially visualized.  A 1.4 cm lesion in the lateral segment of the left hepatic lobe is hypoattenuating relative to adjacent liver but demonstrates attenuation greater water. Than there is minimal central intrahepatic biliary dilatation. The common bile duct is upper limits of normal in  size, measuring 9 mm in diameter. The pancreatic duct is minimally prominent in the region of the head, measuring 3 mm. The gallbladder, spleen, and adrenal glands are unremarkable. Subcentimeter low-density lesions in the kidneys are too small to characterize but most likely represent cysts. There is a 2.4 cm cyst in the upper pole of the left kidney. There is also a 4 mm fat density lesion in the interpolar right kidney which may represent a tiny angiomyolipoma.  There is diffuse left-sided colonic diverticulosis. There is extensive masslike soft tissue thickening involving the sigmoid colon measuring approximately 7.6 x 7.7 x 6.3 cm in total, with a lower density component extending laterally and measuring 4.8 x 4.5 x 4.0 cm. There are multiple adjacent subcentimeter pericolonic lymph nodes. A larger left external iliac/ pericolonic lymph node measures 1.0 cm in short axis. Multiple mildly prominent left para-aortic lymph nodes measure up to 1.0 cm in short axis.  No intraperitoneal free air or free fluid is seen. There is no evidence of bowel obstruction. There is rather extensive aortoiliac atherosclerotic calcification. The sigmoid colon the mass is inseparable from the uterine fundus and may involve the uterus directly. No suspicious lytic or blastic osseous lesions are identified. Advanced disc degeneration is present in the lower lumbar spine.  IMPRESSION: 1. Masslike soft tissue involving the sigmoid colon, most concerning for primary colonic malignancy with necrotic component or less likely abscess. While there is extensive left-sided colonic diverticulosis, this appears more complex and masslike than typical perforated diverticulitis and abscess formation. 2. Small pelvic and retroperitoneal lymph nodes, indeterminate but could represent nodal metastatic disease. 3. 1.4 cm hypoattenuating liver lesion, indeterminate although a metastasis is not excluded. 4. Tiny right lower lobe lung nodules measuring  2-3 mm in size. These results were called by telephone at the time of interpretation on 02/13/2015 at 5:51 pm to Dr. RATheotis Burrow who verbally acknowledged these results.   Electronically Signed   By: AlLogan Bores.D.   On: 02/13/2015 17:55   I have personally reviewed and evaluated these lab results as part of my medical decision-making.   EKG Interpretation None     Medications  sodium chloride 0.9 % bolus 1,000 mL (0 mLs Intravenous Stopped 02/13/15 1643)  ondansetron (ZOFRAN) injection 4 mg (4 mg Intravenous Given 02/13/15 1536)  fentaNYL (SUBLIMAZE) injection 50 mcg (50 mcg Intravenous Given 02/13/15 1535)  iohexol (OMNIPAQUE) 300 MG/ML solution 100 mL (75 mLs Intravenous Contrast Given 02/13/15 1643)  iohexol (OMNIPAQUE) 300 MG/ML solution 25 mL (25 mLs Oral Contrast Given 02/13/15 1643)  metroNIDAZOLE (FLAGYL) tablet 500 mg (500 mg Oral Given 02/13/15 1904)  ciprofloxacin (CIPRO) tablet 500 mg (500 mg Oral Given 02/13/15 1904)    MDM   Final diagnoses:  None   colon Mass LLQ abdominal pain Weight loss  81 year old female who presents with worsening left lower quadrant abdominal pain in the setting of a chronic history of intermittent left sided abdominal pain as well as 1 day of right lower quadrant pain. Patient uncomfortable but well-appearing at presentation. Vital signs notable for BP 106/56. Patient afebrile. She was exquisitely tender in left lower quadrant with some right lower quadrant tenderness as well. No peritonitis. Obtained above lab work which was notable for creatinine of 1.15, WBC 16.3 concerning for infection. Gave the patient an IV fluid bolus and fentanyl, Zofran. Differential diagnosis is broad and includes diverticulitis, appendicitis, mass. Obtained CT of the abdomen and pelvis to evaluate for acute intra-abdominal process.  Labwork shows creatinine 1.15, WBC 16.3, mild anemia with hemoglobin 11.3. CT shows a mass involving sigmoid colon concerning for malignancy  without obvious diverticulitis or abscess. Patient does have some diverticulosis. Also scattered pelvic and retroperitoneal lymph nodes concerning for metastatic disease. Because the patient's pain has been progressive for the past few days, I did give her ciprofloxacin and Flagyl to cover for diverticulitis. Given patient's ongoing symptoms and concerning findings on CT scan, she will be admitted to general medicine for workup of her mass. I have sent a lactate to rule out bowel ischemia.   Sharlett Iles, MD 02/13/15 [phone]

## 2015-02-13 NOTE — ED Notes (Signed)
Patient transported to CT 

## 2015-02-13 NOTE — ED Notes (Signed)
Pt stated she has been having stomach pain and cramping on and off for the last week. She states that over the last year she has lost 20 pounds. Per denies N/V . She thinks she may have IBS but has never been dx with this. -Pt c/o of left sided abd lump which she states is painful to palpation. Positive BS and abd is tender on the left lside.

## 2015-02-14 ENCOUNTER — Inpatient Hospital Stay (HOSPITAL_COMMUNITY): Payer: Medicare Other

## 2015-02-14 DIAGNOSIS — D72829 Elevated white blood cell count, unspecified: Secondary | ICD-10-CM

## 2015-02-14 LAB — CBC
HEMATOCRIT: 29.9 % — AB (ref 36.0–46.0)
Hemoglobin: 10.1 g/dL — ABNORMAL LOW (ref 12.0–15.0)
MCH: 33.1 pg (ref 26.0–34.0)
MCHC: 33.8 g/dL (ref 30.0–36.0)
MCV: 98 fL (ref 78.0–100.0)
PLATELETS: 457 10*3/uL — AB (ref 150–400)
RBC: 3.05 MIL/uL — ABNORMAL LOW (ref 3.87–5.11)
RDW: 13.1 % (ref 11.5–15.5)
WBC: 13.1 10*3/uL — AB (ref 4.0–10.5)

## 2015-02-14 LAB — COMPREHENSIVE METABOLIC PANEL
ALBUMIN: 2.5 g/dL — AB (ref 3.5–5.0)
ALT: 13 U/L — AB (ref 14–54)
AST: 38 U/L (ref 15–41)
Alkaline Phosphatase: 73 U/L (ref 38–126)
Anion gap: 6 (ref 5–15)
BUN: 20 mg/dL (ref 6–20)
CHLORIDE: 100 mmol/L — AB (ref 101–111)
CO2: 26 mmol/L (ref 22–32)
CREATININE: 0.95 mg/dL (ref 0.44–1.00)
Calcium: 8.1 mg/dL — ABNORMAL LOW (ref 8.9–10.3)
GFR calc Af Amer: >60 mL/min (ref >60)
GFR, EST NON AFRICAN AMERICAN: 52 mL/min — AB (ref >60)
GLUCOSE: 124 mg/dL — AB (ref 65–99)
Potassium: 3.6 mmol/L (ref 3.5–5.1)
Sodium: 132 mmol/L — ABNORMAL LOW (ref 135–145)
Total Bilirubin: 0.3 mg/dL (ref 0.3–1.2)
Total Protein: 5.8 g/dL — ABNORMAL LOW (ref 6.5–8.1)

## 2015-02-14 LAB — IRON AND TIBC
Iron: 15 ug/dL — ABNORMAL LOW (ref 28–170)
SATURATION RATIOS: 8 % — AB (ref 10.4–31.8)
TIBC: 196 ug/dL — ABNORMAL LOW (ref 250–450)
UIBC: 181 ug/dL

## 2015-02-14 LAB — VITAMIN B12: VITAMIN B 12: 2084 pg/mL — AB (ref 180–914)

## 2015-02-14 LAB — MAGNESIUM: Magnesium: 2.1 mg/dL (ref 1.7–2.4)

## 2015-02-14 LAB — PHOSPHORUS: Phosphorus: 3.6 mg/dL (ref 2.5–4.6)

## 2015-02-14 LAB — FOLATE: FOLATE: 32 ng/mL (ref >5.9)

## 2015-02-14 LAB — FERRITIN: FERRITIN: 219 ng/mL (ref 11–307)

## 2015-02-14 MED ORDER — SACCHAROMYCES BOULARDII 250 MG PO CAPS
250.0000 mg | ORAL_CAPSULE | Freq: Two times a day (BID) | ORAL | Status: DC
  Administered 2015-02-14 – 2015-02-22 (×15): 250 mg via ORAL
  Filled 2015-02-14 (×16): qty 1

## 2015-02-14 MED ORDER — PIPERACILLIN-TAZOBACTAM 3.375 G IVPB
3.3750 g | Freq: Three times a day (TID) | INTRAVENOUS | Status: DC
  Administered 2015-02-14 – 2015-02-15 (×2): 3.375 g via INTRAVENOUS
  Filled 2015-02-14 (×3): qty 50

## 2015-02-14 NOTE — H&P (Signed)
[facility] History and Physical  Katherine Cooke BZJ:[id number] DOB: 1926-03-13 DOA: 02/13/2015  Referring physician: Sharlett Iles, MD PCP: Tommy Medal, MD   Chief Complaint: Abdominal pain.  HPI: Katherine Cooke is a 81 y.o. 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 She says that she has felt a lump in the left lower quadrant for about 5 years, and during this time has had alternating periods of diarrhea and constipation. However she has not had episodes of severe abdominal pain like this past week. She reports the occasional hematochezia, decreased appetite, dyspepsia, abdominal distention and has had 20 pound weight loss in the last year. She denies fever, chills, but feels very fatigued.  She also complains of chronic dry cough, which seems to have improved to a certain stem by the use of singulair. She recently was seen by the ENT, who reported to the patient mildly abnormal vocal cords and stated that this could be the reason for the patient's cough due to inflammation and mucous buildup.  When seen she was in no acute distress and stated that her nausea and abdominal pain were better.   Review of Systems:  Constitutional:  +20 pound weight loss in the past year,  fatigue.  Negative night sweats, Fevers, chills, HEENT:  No headaches, Difficulty swallowing,Tooth/dental problems,Sore throat,  No sneezing, itching, ear ache, nasal congestion, post nasal drip,  Cardio-vascular:  No chest pain, Orthopnea, PND, swelling in lower extremities, anasarca, dizziness, palpitations  GI:  Positive abdominal pain, nausea,loss of appetite, hematochezia. No heartburn, indigestion,  vomiting, diarrhea, change in bowel habits. Resp:  No shortness of breath with exertion or at  rest. No excess mucus, no productive cough, No non-productive cough, No coughing up of blood.No change in color of mucus.No wheezing.No chest wall deformity  Skin:  no rash or lesions.  GU:  no dysuria, change in color of urine, no urgency or frequency. No flank pain.  Musculoskeletal:  Occasional joint pain or swelling. No decreased range of motion. No back pain.  Psych:  No change in mood or affect. No depression or anxiety. No memory loss.   Past Medical History  Diagnosis Date  . Basal cell carcinoma of nose 2005  . Squamous cell carcinoma of left hip 2005  . Hypertension   . Spinal stenosis    Past Surgical History  Procedure Laterality Date  . Breast biopsy  1950's ; 1952 ; 1954    x 3 - Benign  . Tonsillectomy     Social History:  reports that she has never smoked. She has never used smokeless tobacco. She reports that she drinks about 4.2 oz of alcohol per week. She reports that she does not use illicit drugs.  Allergies  Allergen Reactions  . Lyrica [Pregabalin] Shortness Of Breath    Family History  Problem Relation Age of Onset  . Breast cancer Maternal Aunt   . Ovarian cancer Sister   . Heart disease Father   . Ulcers Father   . COPD Mother     Prior to Admission medications   Medication Sig Start Date End Date Taking? Authorizing Provider  ALPRAZolam Duanne Moron) 0.25 MG tablet Take 0.25 mg by mouth daily as needed for anxiety or sleep.  08/01/13  Yes Historical Provider, MD  Ascorbic Acid (VITAMIN C PO)  Take 1 tablet by mouth daily.    Yes Historical Provider, MD  aspirin 81 MG tablet Take 81 mg by mouth daily.   Yes Historical Provider, MD  bisacodyl (DULCOLAX) 5 MG EC tablet Take 5 mg by mouth daily as needed for mild constipation or moderate constipation.   Yes Historical Provider, MD  Ca Carbonate-Mag Hydroxide (ROLAIDS PO) Take 2 tablets by mouth 2 (two) times daily as needed (indigestion).   Yes Historical Provider, MD  cholecalciferol (VITAMIN D) 1000  UNITS tablet Take 1,000 Units by mouth daily.   Yes Historical Provider, MD  cycloSPORINE (RESTASIS) 0.05 % ophthalmic emulsion Place 1 drop into both eyes 2 (two) times daily.   Yes Historical Provider, MD  diclofenac sodium (VOLTAREN) 1 % GEL Apply 4 g topically daily as needed (pain).  10/15/14  Yes Historical Provider, MD  ibandronate (BONIVA) 150 MG tablet Take 150 mg by mouth every 30 (thirty) days. Take in the morning with a full glass of water, on an empty stomach, and do not take anything else by mouth or lie down for the next 30 min.   Yes Historical Provider, MD  losartan-hydrochlorothiazide (HYZAAR) 100-12.5 MG per tablet Take 1 tablet by mouth daily.  07/23/13  Yes Historical Provider, MD  MAGNESIUM PO Take 1 tablet by mouth daily.    Yes Historical Provider, MD  montelukast (SINGULAIR) 10 MG tablet Take 10 mg by mouth at bedtime. 01/25/15  Yes Historical Provider, MD  Multiple Vitamins-Minerals (EYE VITAMINS PO) Take by mouth.   Yes Historical Provider, MD  Omega-3 Fatty Acids (FISH OIL PO) Take 2 capsules by mouth 2 (two) times daily.    Yes Historical Provider, MD  polyethylene glycol (MIRALAX / GLYCOLAX) packet Take 17 g by mouth daily as needed for mild constipation or moderate constipation.   Yes Historical Provider, MD  triazolam (HALCION) 0.25 MG tablet Take 0.25 mg by mouth at bedtime as needed for sleep.  12/10/14  Yes Historical Provider, MD   Physical Exam: Filed Vitals:   02/13/15 1600 02/13/15 1814 02/13/15 2006 02/13/15 2116  BP: 122/65 96/50 121/61 135/52  Pulse: 80 89 111 85  Temp:  98 F (36.7 C)  98.1 F (36.7 C)  TempSrc:    Oral  Resp: '18 16 18 16  '$ SpO2: 99% 98% 96% 99%    Wt Readings from Last 3 Encounters:  01/13/15 50.803 kg (112 lb)  09/01/13 58.968 kg (130 lb)    General:  Appears calm and comfortable Eyes: PERRL, normal lids, irises & conjunctiva ENT: grossly normal hearing, lips & tongue Neck: no LAD, masses or thyromegaly Cardiovascular: RRR, no  m/r/g. No LE edema. Telemetry: Not applied. Respiratory: CTA bilaterally, no w/r/r. Normal respiratory effort. Abdomen: Bowel sounds positive, soft, left lower quadrant tenderness without guarding or rebound tenderness. What the patient perceives as a mass seems to be a subcutaneous lipoma or nodule. Skin: no rash or induration seen on limited exam Musculoskeletal: grossly normal tone BUE/BLE Psychiatric: grossly normal mood and affect, speech fluent and appropriate Neurologic: grossly non-focal.          Labs on Admission:  Basic Metabolic Panel:  Recent Labs Lab 02/13/15 1217  NA 131*  K 4.1  CL 96*  CO2 26  GLUCOSE 105*  BUN 19  CREATININE 1.15*  CALCIUM 9.0   Liver Function Tests:  Recent Labs Lab 02/13/15 1217  AST 22  ALT 13*  ALKPHOS 86  BILITOT 0.8  PROT 7.1  ALBUMIN 3.1*  Recent Labs Lab 02/13/15 1217  LIPASE 18*   No results for input(s): AMMONIA in the last 168 hours. CBC:  Recent Labs Lab 02/13/15 1217  WBC 16.3*  HGB 11.3*  HCT 33.2*  MCV 98.2  PLT 472*    Radiological Exams on Admission: Ct Abdomen Pelvis W Contrast  02/13/2015   CLINICAL DATA:  Left lower quadrant pain for 1 week. New right lower quadrant pain. Leukocytosis. 20 pound weight loss over the past 1 year.  EXAM: CT ABDOMEN AND PELVIS WITH CONTRAST  TECHNIQUE: Multidetector CT imaging of the abdomen and pelvis was performed using the standard protocol following bolus administration of intravenous contrast.  CONTRAST:  34m OMNIPAQUE IOHEXOL 300 MG/ML SOLN, 233mOMNIPAQUE IOHEXOL 300 MG/ML SOLN  COMPARISON:  Abdominal radiographs 10/13/2014  FINDINGS: Minimal dependent atelectasis is present in the lung bases. There are several small right lower lobe lung nodules which measure up to 3 mm in size (series 7, image 1). Mitral annular calcification is partially visualized.  A 1.4 cm lesion in the lateral segment of the left hepatic lobe is hypoattenuating relative to adjacent liver but  demonstrates attenuation greater water. Than there is minimal central intrahepatic biliary dilatation. The common bile duct is upper limits of normal in size, measuring 9 mm in diameter. The pancreatic duct is minimally prominent in the region of the head, measuring 3 mm. The gallbladder, spleen, and adrenal glands are unremarkable. Subcentimeter low-density lesions in the kidneys are too small to characterize but most likely represent cysts. There is a 2.4 cm cyst in the upper pole of the left kidney. There is also a 4 mm fat density lesion in the interpolar right kidney which may represent a tiny angiomyolipoma.  There is diffuse left-sided colonic diverticulosis. There is extensive masslike soft tissue thickening involving the sigmoid colon measuring approximately 7.6 x 7.7 x 6.3 cm in total, with a lower density component extending laterally and measuring 4.8 x 4.5 x 4.0 cm. There are multiple adjacent subcentimeter pericolonic lymph nodes. A larger left external iliac/ pericolonic lymph node measures 1.0 cm in short axis. Multiple mildly prominent left para-aortic lymph nodes measure up to 1.0 cm in short axis.  No intraperitoneal free air or free fluid is seen. There is no evidence of bowel obstruction. There is rather extensive aortoiliac atherosclerotic calcification. The sigmoid colon the mass is inseparable from the uterine fundus and may involve the uterus directly. No suspicious lytic or blastic osseous lesions are identified. Advanced disc degeneration is present in the lower lumbar spine.  IMPRESSION: 1. Masslike soft tissue involving the sigmoid colon, most concerning for primary colonic malignancy with necrotic component or less likely abscess. While there is extensive left-sided colonic diverticulosis, this appears more complex and masslike than typical perforated diverticulitis and abscess formation. 2. Small pelvic and retroperitoneal lymph nodes, indeterminate but could represent nodal metastatic  disease. 3. 1.4 cm hypoattenuating liver lesion, indeterminate although a metastasis is not excluded. 4. Tiny right lower lobe lung nodules measuring 2-3 mm in size. These results were called by telephone at the time of interpretation on 02/13/2015 at 5:51 pm to Dr. RATheotis Burrow who verbally acknowledged these results.   Electronically Signed   By: AlLogan Bores.D.   On: 02/13/2015 17:55    EKG: Independently reviewed. EKG is in ordered status.  Assessment/Plan Principal Problem:   Colonic mass Admit to the hospital for pain and nausea management. The patient states that she is very hungry, so I will allow  liquid diet. I explained to her, to her son and daughter that most likely we will need a colonoscopy. Consult gastroenterology in the morning.   Active Problems:   Hypertension Continue Hyzaar. Monitor blood pressure. Monitor BUN and creatinine.    Osteopenia Continue monthly Boniva. Continue calcium and vitamin D supplementation.    Anemia She has positive fecal occult blood. Check anemia workup. Monitor H&H.   Code Status: Full code. DVT Prophylaxis: SCDs. Family Communication:  Her daughter and son were present in the room.  Nataliah, [place] Son [phone]     Cherre Huger  [phone]    Disposition Plan: Admit for further evaluation of this mass.  Time spent: Over 70 minutes were spent admitting this patient to the hospital.  Reubin Milan [facility] Pager [phone].

## 2015-02-14 NOTE — Consult Note (Signed)
Referring Provider:  Dr. Wynelle Cleveland Primary Care Physician:  Dr. Barnet Pall Primary Gastroenterologist:  Dr. Fuller Plan  Reason for Consultation:  Abnormal GI x-ray  HPI: Katherine Cooke is a 81 y.o. female admitted through the emergency room today because of a roughly 2 day history of pain across her lower abdomen. A CT scan in the emergency room shows a sigmoid mass, possibly diverticular disease with abscess, although felt more likely to be compatible with a tumor with necrosis. The patient has had a 20 pound weight loss over the past year (involuntary).  There is a long-standing history of difficulty with bowel movements, sometimes constipation, sometimes diarrhea, with a slight bulge or fullness in the left lower quadrant when constipated.  The patient has not been running fevers to her knowledge. She thinks she saw very small amount of her blood in the stool recently.  Blood work in the emergency room showed anemia with a hemoglobin of 10.1 with a normal ferritin level of 219 whereas iron saturation was low at 8.  The patient believes that her last colonoscopy was between 7 and 10 years ago; it was performed by Dr. Fuller Plan. She believes she has had 2 colonoscopies.   Past Medical History  Diagnosis Date  . Basal cell carcinoma of nose 2005  . Squamous cell carcinoma of left hip 2005  . Hypertension   . Spinal stenosis     Past Surgical History  Procedure Laterality Date  . Breast biopsy  1950's ; 1952 ; 1954    x 3 - Benign  . Tonsillectomy      Prior to Admission medications   Medication Sig Start Date End Date Taking? Authorizing Provider  ALPRAZolam Duanne Moron) 0.25 MG tablet Take 0.25 mg by mouth daily as needed for anxiety or sleep.  08/01/13  Yes Historical Provider, MD  Ascorbic Acid (VITAMIN C PO) Take 1 tablet by mouth daily.    Yes Historical Provider, MD  aspirin 81 MG tablet Take 81 mg by mouth daily.   Yes Historical Provider, MD  bisacodyl (DULCOLAX) 5 MG EC tablet  Take 5 mg by mouth daily as needed for mild constipation or moderate constipation.   Yes Historical Provider, MD  Ca Carbonate-Mag Hydroxide (ROLAIDS PO) Take 2 tablets by mouth 2 (two) times daily as needed (indigestion).   Yes Historical Provider, MD  cholecalciferol (VITAMIN D) 1000 UNITS tablet Take 1,000 Units by mouth daily.   Yes Historical Provider, MD  cycloSPORINE (RESTASIS) 0.05 % ophthalmic emulsion Place 1 drop into both eyes 2 (two) times daily.   Yes Historical Provider, MD  diclofenac sodium (VOLTAREN) 1 % GEL Apply 4 g topically daily as needed (pain).  10/15/14  Yes Historical Provider, MD  ibandronate (BONIVA) 150 MG tablet Take 150 mg by mouth every 30 (thirty) days. Take in the morning with a full glass of water, on an empty stomach, and do not take anything else by mouth or lie down for the next 30 min.   Yes Historical Provider, MD  losartan-hydrochlorothiazide (HYZAAR) 100-12.5 MG per tablet Take 1 tablet by mouth daily.  07/23/13  Yes Historical Provider, MD  MAGNESIUM PO Take 1 tablet by mouth daily.    Yes Historical Provider, MD  montelukast (SINGULAIR) 10 MG tablet Take 10 mg by mouth at bedtime. 01/25/15  Yes Historical Provider, MD  Multiple Vitamins-Minerals (EYE VITAMINS PO) Take by mouth.   Yes Historical Provider, MD  Omega-3 Fatty Acids (FISH OIL PO) Take 2 capsules by mouth 2 (  two) times daily.    Yes Historical Provider, MD  polyethylene glycol (MIRALAX / GLYCOLAX) packet Take 17 g by mouth daily as needed for mild constipation or moderate constipation.   Yes Historical Provider, MD  triazolam (HALCION) 0.25 MG tablet Take 0.25 mg by mouth at bedtime as needed for sleep.  12/10/14  Yes Historical Provider, MD    Current Facility-Administered Medications  Medication Dose Route Frequency Provider Last Rate Last Dose  . cycloSPORINE (RESTASIS) 0.05 % ophthalmic emulsion 1 drop  1 drop Both Eyes BID Reubin Milan, MD   1 drop at 02/14/15 2018  . dextrose 5 %-0.9 %  sodium chloride infusion   Intravenous Continuous Reubin Milan, MD 66 mL/hr at 02/13/15 2328    . losartan (COZAAR) tablet 100 mg  100 mg Oral Daily Debbe Odea, MD   100 mg at 02/14/15 0918  . montelukast (SINGULAIR) tablet 10 mg  10 mg Oral QHS Reubin Milan, MD   10 mg at 02/14/15 2018  . morphine 4 MG/ML injection 1 mg  1 mg Intravenous Q2H PRN Reubin Milan, MD   1 mg at 02/14/15 2018  . triazolam (HALCION) tablet 0.25 mg  0.25 mg Oral QHS PRN Reubin Milan, MD   0.25 mg at 02/13/15 2319    Allergies as of 02/13/2015 - Review Complete 02/13/2015  Allergen Reaction Noted  . Lyrica [pregabalin] Shortness Of Breath 09/01/2013    Family History  Problem Relation Age of Onset  . Breast cancer Maternal Aunt   . Ovarian cancer Sister   . Heart disease Father   . Ulcers Father   . COPD Mother     Social History   Social History  . Marital Status: Widowed    Spouse Name: N/A  . Number of Children: N/A  . Years of Education: N/A   Occupational History  . Not on file.   Social History Main Topics  . Smoking status: Never Smoker   . Smokeless tobacco: Never Used  . Alcohol Use: 4.2 oz/week    7 Glasses of wine per week     Comment: occasionally  . Drug Use: No  . Sexual Activity: Not Currently   Other Topics Concern  . Not on file   Social History Narrative    Review of Systems: No fevers or shaking chills. No significant rectal bleeding.  Physical Exam: Vital signs in last 24 hours: Temp:  [97.7 F (36.5 C)-98.9 F (37.2 C)] 97.7 F (36.5 C) (09/11 1437) Pulse Rate:  [85-92] 92 (09/11 1437) Resp:  [16] 16 (09/11 1437) BP: (87-135)/(48-52) 107/48 mmHg (09/11 1437) SpO2:  [96 %-99 %] 99 % (09/11 1437) Weight:  [48.535 kg (107 lb)] 48.535 kg (107 lb) (09/10 2116) Last BM Date: 02/14/15 (diarrhea) General:   Alert,  Well-developed, well-nourished, pleasant and cooperative in NAD Head:  Normocephalic and atraumatic. Eyes:  Sclera clear, no  icterus.   Conjunctiva pink. Mouth:   No ulcerations or lesions.  Oropharynx pink & moist. Neck:   No masses or thyromegaly. Lungs:  Clear throughout to auscultation.   No wheezes, crackles, or rhonchi. No evident respiratory distress. Heart:   Regular rate and rhythm; no murmurs, clicks, rubs,  or gallops. Abdomen:  Soft, mildly distended and tympanitic, active bowel sounds present. There is some mild tenderness, no rigidity or rebound to suggest peritoneal findings.    Msk:   Symmetrical without gross deformities. Pulses:  Normal radial pulse is noted. Extremities:   Without  clubbing, cyanosis, or edema. Neurologic:  Alert and coherent;  grossly normal neurologically. Skin:  Intact without significant lesions or rashes. Cervical Nodes:  No significant cervical adenopathy. Psych:   Alert and cooperative. Normal mood and affect.  Intake/Output from previous day: 09/10 0701 - 09/11 0700 In: 240 [P.O.:240] Out: -  Intake/Output this shift:    Lab Results:  Recent Labs  02/13/15 1217 02/14/15 0512  WBC 16.3* 13.1*  HGB 11.3* 10.1*  HCT 33.2* 29.9*  PLT 472* 457*   BMET  Recent Labs  02/13/15 1217 02/14/15 0512  NA 131* 132*  K 4.1 3.6  CL 96* 100*  CO2 26 26  GLUCOSE 105* 124*  BUN 19 20  CREATININE 1.15* 0.95  CALCIUM 9.0 8.1*   LFT  Recent Labs  02/14/15 0512  PROT 5.8*  ALBUMIN 2.5*  AST 38  ALT 13*  ALKPHOS 73  BILITOT 0.3   PT/INR No results for input(s): LABPROT, INR in the last 72 hours.  Studies/Results: Ct Abdomen Pelvis W Contrast  02/13/2015   CLINICAL DATA:  Left lower quadrant pain for 1 week. New right lower quadrant pain. Leukocytosis. 20 pound weight loss over the past 1 year.  EXAM: CT ABDOMEN AND PELVIS WITH CONTRAST  TECHNIQUE: Multidetector CT imaging of the abdomen and pelvis was performed using the standard protocol following bolus administration of intravenous contrast.  CONTRAST:  63m OMNIPAQUE IOHEXOL 300 MG/ML SOLN, 280m OMNIPAQUE IOHEXOL 300 MG/ML SOLN  COMPARISON:  Abdominal radiographs 10/13/2014  FINDINGS: Minimal dependent atelectasis is present in the lung bases. There are several small right lower lobe lung nodules which measure up to 3 mm in size (series 7, image 1). Mitral annular calcification is partially visualized.  A 1.4 cm lesion in the lateral segment of the left hepatic lobe is hypoattenuating relative to adjacent liver but demonstrates attenuation greater water. Than there is minimal central intrahepatic biliary dilatation. The common bile duct is upper limits of normal in size, measuring 9 mm in diameter. The pancreatic duct is minimally prominent in the region of the head, measuring 3 mm. The gallbladder, spleen, and adrenal glands are unremarkable. Subcentimeter low-density lesions in the kidneys are too small to characterize but most likely represent cysts. There is a 2.4 cm cyst in the upper pole of the left kidney. There is also a 4 mm fat density lesion in the interpolar right kidney which may represent a tiny angiomyolipoma.  There is diffuse left-sided colonic diverticulosis. There is extensive masslike soft tissue thickening involving the sigmoid colon measuring approximately 7.6 x 7.7 x 6.3 cm in total, with a lower density component extending laterally and measuring 4.8 x 4.5 x 4.0 cm. There are multiple adjacent subcentimeter pericolonic lymph nodes. A larger left external iliac/ pericolonic lymph node measures 1.0 cm in short axis. Multiple mildly prominent left para-aortic lymph nodes measure up to 1.0 cm in short axis.  No intraperitoneal free air or free fluid is seen. There is no evidence of bowel obstruction. There is rather extensive aortoiliac atherosclerotic calcification. The sigmoid colon the mass is inseparable from the uterine fundus and may involve the uterus directly. No suspicious lytic or blastic osseous lesions are identified. Advanced disc degeneration is present in the lower lumbar  spine.  IMPRESSION: 1. Masslike soft tissue involving the sigmoid colon, most concerning for primary colonic malignancy with necrotic component or less likely abscess. While there is extensive left-sided colonic diverticulosis, this appears more complex and masslike than typical perforated diverticulitis and  abscess formation. 2. Small pelvic and retroperitoneal lymph nodes, indeterminate but could represent nodal metastatic disease. 3. 1.4 cm hypoattenuating liver lesion, indeterminate although a metastasis is not excluded. 4. Tiny right lower lobe lung nodules measuring 2-3 mm in size. These results were called by telephone at the time of interpretation on 02/13/2015 at 5:51 pm to Dr. Theotis Burrow , who verbally acknowledged these results.   Electronically Signed   By: Logan Bores M.D.   On: 02/13/2015 17:55    Impression: 1. Low abdominal pain, subacute onset 2. Mass effect in sigmoid region, infectious/inflammatory versus neoplastic 3. Anemia with mixed picture for possible iron deficiency 4. Involuntary 20 pound weight loss over the past year  Plan: 1. Will cover with antibiotic therapy and prophylactic probiotics, in case this is smoldering diverticulitis 2. I will check with Dr. Lynne Leader group to see if they would like to assume management of the GI aspects of this patient's care. If not, I would be happy to continue to see her. 3. I think it would be reasonable to proceed with an unprepped sigmoidoscopy tomorrow, using a small scope and minimal insufflation so as to minimize the risk of disrupting any diverticular inflammation or infection that may be present. The nature, purpose, and risks of this were reviewed with the patient and she is agreeable.    LOS: 1 day   Korra Christine V  02/14/2015, 8:37 PM   Pager [phone] If no answer or after 5 PM call [phone]

## 2015-02-14 NOTE — Progress Notes (Addendum)
[facility] Progress Note   Katherine Cooke  SWN:[id number]  DOB: [birth year]  DOA: 02/13/2015 PCP: Tommy Medal, MD  Brief narrative: Katherine Cooke is a 81 y.o. female the history of hypertension, spinal stenosis who presents with lower abdominal discomfort. She has been having on and off cramping worse for the past week. She has had a mass in the left lower abdomen and troubles with constipation and occasional discomfort in the left lower quadrant for many years has never mentioned it to her PCP.  She had a small amount of blood in her stool in the morning of admission. She states that symptoms have been much worse over the past week. Imaging in the ER reveals a sigmoid mass which is either a malignancy or possible abscess. She is also found to have leukocytosis. She is admitted for further admitted with IV antibiotics and workup.   Subjective: Currently having no abdominal pain. No nausea or vomiting. No shortness of breath.  Assessment/Plan: Principal Problem:   Colonic mass - Sigmoid mass suggestive of malignancy versus abscess with leukocytosis -Clear liquids for now -Continue prophylaxis and on Flagyl -GI consulted for biopsy  Active Problems:   Hypertension - Hypotensive this morning -Holding parameters placed on losartan -Hold HCTZ as she appears dehydrated  Renal failure/hyponatremia/elevated BUN/creatinine ratio -Unable to tell if renal failure is acute or chronic as there is no old lab work -Low sodium level and BUN/creatinine ratio consistent with dehydration -Creatinine has improved slightly from yesterday -We will continue slow IV fluids and follow creatinine- hold HCTZ    Anemia -Anemia panel pending   Code Status:     Code Status Orders        Start     Ordered   02/13/15 2221  Full code   Continuous     02/13/15 2220    Advance Directive Documentation        Most Recent Value   Type of Advance Directive  Living will, Healthcare Power of  Attorney   Pre-existing out of facility DNR order (yellow form or pink MOST form)     "MOST" Form in Place?       Family Communication:  Disposition Plan:  home when stable DVT prophylaxis: SCDs Consultants: GI consult Procedures: Appt with PCP: Requested Antibiotics: Anti-infectives    Start     Dose/Rate Route Frequency Ordered Stop   02/13/15 1900  metroNIDAZOLE (FLAGYL) tablet 500 mg     500 mg Oral  Once 02/13/15 1847 02/13/15 1904   02/13/15 1900  ciprofloxacin (CIPRO) tablet 500 mg     500 mg Oral  Once 02/13/15 1847 02/13/15 1904      Objective: Filed Weights   02/13/15 2116  Weight: 48.535 kg (107 lb)    Intake/Output Summary (Last 24 hours) at 02/14/15 1015 Last data filed at 02/14/15 0900  Gross per 24 hour  Intake    480 ml  Output      0 ml  Net    480 ml     Vitals Filed Vitals:   02/13/15 1814 02/13/15 2006 02/13/15 2116 02/14/15 0518  BP: 96/50 121/61 135/52 87/48  Pulse: 89 111 85 92  Temp: 98 F (36.7 C)  98.1 F (36.7 C) 98.9 F (37.2 C)  TempSrc:   Oral Oral  Resp: '16 18 16 16  '$ Height:   '4\' 11"'$  (1.499 m)   Weight:   48.535 kg (107 lb)   SpO2: 98% 96% 99% 96%    Exam:  General:  Pt is alert, not in acute distress  HEENT: No icterus, No thrush, oral mucosa moist  Cardiovascular: regular rate and rhythm, S1/S2 No murmur  Respiratory: clear to auscultation bilaterally   Abdomen: Soft, +Bowel sounds, tender in left lower quadrant and lower mid abdomen with palpable mass, non distended, no guarding  MSK: No LE edema, cyanosis or clubbing  Data Reviewed: Basic Metabolic Panel:  Recent Labs Lab 02/13/15 1217 02/14/15 0512  NA 131* 132*  K 4.1 3.6  CL 96* 100*  CO2 26 26  GLUCOSE 105* 124*  BUN 19 20  CREATININE 1.15* 0.95  CALCIUM 9.0 8.1*  MG  --  2.1  PHOS  --  3.6   Liver Function Tests:  Recent Labs Lab 02/13/15 1217 02/14/15 0512  AST 22 38  ALT 13* 13*  ALKPHOS 86 73  BILITOT 0.8 0.3  PROT 7.1 5.8*   ALBUMIN 3.1* 2.5*    Recent Labs Lab 02/13/15 1217  LIPASE 18*   No results for input(s): AMMONIA in the last 168 hours. CBC:  Recent Labs Lab 02/13/15 1217 02/14/15 0512  WBC 16.3* 13.1*  HGB 11.3* 10.1*  HCT 33.2* 29.9*  MCV 98.2 98.0  PLT 472* 457*   Cardiac Enzymes: No results for input(s): CKTOTAL, CKMB, CKMBINDEX, TROPONINI in the last 168 hours. BNP (last 3 results) No results for input(s): BNP in the last 8760 hours.  ProBNP (last 3 results) No results for input(s): PROBNP in the last 8760 hours.  CBG: No results for input(s): GLUCAP in the last 168 hours.  No results found for this or any previous visit (from the past 240 hour(s)).   Studies: Ct Abdomen Pelvis W Contrast  02/13/2015   CLINICAL DATA:  Left lower quadrant pain for 1 week. New right lower quadrant pain. Leukocytosis. 20 pound weight loss over the past 1 year.  EXAM: CT ABDOMEN AND PELVIS WITH CONTRAST  TECHNIQUE: Multidetector CT imaging of the abdomen and pelvis was performed using the standard protocol following bolus administration of intravenous contrast.  CONTRAST:  54m OMNIPAQUE IOHEXOL 300 MG/ML SOLN, 269mOMNIPAQUE IOHEXOL 300 MG/ML SOLN  COMPARISON:  Abdominal radiographs 10/13/2014  FINDINGS: Minimal dependent atelectasis is present in the lung bases. There are several small right lower lobe lung nodules which measure up to 3 mm in size (series 7, image 1). Mitral annular calcification is partially visualized.  A 1.4 cm lesion in the lateral segment of the left hepatic lobe is hypoattenuating relative to adjacent liver but demonstrates attenuation greater water. Than there is minimal central intrahepatic biliary dilatation. The common bile duct is upper limits of normal in size, measuring 9 mm in diameter. The pancreatic duct is minimally prominent in the region of the head, measuring 3 mm. The gallbladder, spleen, and adrenal glands are unremarkable. Subcentimeter low-density lesions in the  kidneys are too small to characterize but most likely represent cysts. There is a 2.4 cm cyst in the upper pole of the left kidney. There is also a 4 mm fat density lesion in the interpolar right kidney which may represent a tiny angiomyolipoma.  There is diffuse left-sided colonic diverticulosis. There is extensive masslike soft tissue thickening involving the sigmoid colon measuring approximately 7.6 x 7.7 x 6.3 cm in total, with a lower density component extending laterally and measuring 4.8 x 4.5 x 4.0 cm. There are multiple adjacent subcentimeter pericolonic lymph nodes. A larger left external iliac/ pericolonic lymph node measures 1.0 cm in short axis. Multiple mildly  prominent left para-aortic lymph nodes measure up to 1.0 cm in short axis.  No intraperitoneal free air or free fluid is seen. There is no evidence of bowel obstruction. There is rather extensive aortoiliac atherosclerotic calcification. The sigmoid colon the mass is inseparable from the uterine fundus and may involve the uterus directly. No suspicious lytic or blastic osseous lesions are identified. Advanced disc degeneration is present in the lower lumbar spine.  IMPRESSION: 1. Masslike soft tissue involving the sigmoid colon, most concerning for primary colonic malignancy with necrotic component or less likely abscess. While there is extensive left-sided colonic diverticulosis, this appears more complex and masslike than typical perforated diverticulitis and abscess formation. 2. Small pelvic and retroperitoneal lymph nodes, indeterminate but could represent nodal metastatic disease. 3. 1.4 cm hypoattenuating liver lesion, indeterminate although a metastasis is not excluded. 4. Tiny right lower lobe lung nodules measuring 2-3 mm in size. These results were called by telephone at the time of interpretation on 02/13/2015 at 5:51 pm to Dr. Theotis Burrow , who verbally acknowledged these results.   Electronically Signed   By: Logan Bores M.D.    On: 02/13/2015 17:55    Scheduled Meds:  Scheduled Meds: . cycloSPORINE  1 drop Both Eyes BID  . losartan  100 mg Oral Daily   And  . hydrochlorothiazide  12.5 mg Oral Daily  . montelukast  10 mg Oral QHS   Continuous Infusions: . dextrose 5 % and 0.9% NaCl 66 mL/hr at 02/13/15 2328    Time spent on care of this patient: 35 min   [place], MD 02/14/2015, 10:15 AM  LOS: 1 day   [facility] Office  [phone] Pager - Text Page per [email or web address] If 7PM-7AM, please contact night-coverage [email or web address]

## 2015-02-14 NOTE — Progress Notes (Signed)
Utilization Review Completed.Katherine Cooke T9/04/2015  

## 2015-02-15 ENCOUNTER — Encounter (HOSPITAL_COMMUNITY): Payer: Self-pay

## 2015-02-15 ENCOUNTER — Encounter (HOSPITAL_COMMUNITY)
Admission: EM | Disposition: A | Discharge status: Rehabilitation Facility | Payer: Self-pay | Source: Home / Self Care | Attending: Family Medicine

## 2015-02-15 DIAGNOSIS — D638 Anemia in other chronic diseases classified elsewhere: Secondary | ICD-10-CM

## 2015-02-15 HISTORY — PX: FLEXIBLE SIGMOIDOSCOPY: SHX5431

## 2015-02-15 LAB — BASIC METABOLIC PANEL
ANION GAP: 6 (ref 5–15)
BUN: 11 mg/dL (ref 6–20)
CHLORIDE: 101 mmol/L (ref 101–111)
CO2: 26 mmol/L (ref 22–32)
Calcium: 8.2 mg/dL — ABNORMAL LOW (ref 8.9–10.3)
Creatinine, Ser: 0.82 mg/dL (ref 0.44–1.00)
GFR calc non Af Amer: >60 mL/min (ref >60)
Glucose, Bld: 107 mg/dL — ABNORMAL HIGH (ref 65–99)
Potassium: 3.8 mmol/L (ref 3.5–5.1)
Sodium: 133 mmol/L — ABNORMAL LOW (ref 135–145)

## 2015-02-15 LAB — CBC
HEMATOCRIT: 28.2 % — AB (ref 36.0–46.0)
HEMOGLOBIN: 9.4 g/dL — AB (ref 12.0–15.0)
MCH: 32.9 pg (ref 26.0–34.0)
MCHC: 33.3 g/dL (ref 30.0–36.0)
MCV: 98.6 fL (ref 78.0–100.0)
Platelets: 422 10*3/uL — ABNORMAL HIGH (ref 150–400)
RBC: 2.86 MIL/uL — ABNORMAL LOW (ref 3.87–5.11)
RDW: 12.9 % (ref 11.5–15.5)
WBC: 12 10*3/uL — AB (ref 4.0–10.5)

## 2015-02-15 SURGERY — SIGMOIDOSCOPY, FLEXIBLE
Anesthesia: Moderate Sedation

## 2015-02-15 MED ORDER — METRONIDAZOLE IN NACL 5-0.79 MG/ML-% IV SOLN
500.0000 mg | Freq: Three times a day (TID) | INTRAVENOUS | Status: DC
  Administered 2015-02-15 – 2015-02-18 (×7): 500 mg via INTRAVENOUS
  Filled 2015-02-15 (×8): qty 100

## 2015-02-15 MED ORDER — OXYCODONE HCL 5 MG PO TABS
5.0000 mg | ORAL_TABLET | Freq: Four times a day (QID) | ORAL | Status: DC | PRN
  Administered 2015-02-15 – 2015-02-21 (×6): 5 mg via ORAL
  Filled 2015-02-15 (×7): qty 1

## 2015-02-15 MED ORDER — DOCUSATE SODIUM 100 MG PO CAPS
200.0000 mg | ORAL_CAPSULE | Freq: Every day | ORAL | Status: DC
  Administered 2015-02-15 – 2015-02-21 (×7): 200 mg via ORAL
  Filled 2015-02-15 (×7): qty 2

## 2015-02-15 MED ORDER — AMOXICILLIN-POT CLAVULANATE 875-125 MG PO TABS
1.0000 | ORAL_TABLET | Freq: Two times a day (BID) | ORAL | Status: DC
  Administered 2015-02-15: 1 via ORAL
  Filled 2015-02-15: qty 1

## 2015-02-15 MED ORDER — DIPHENHYDRAMINE HCL 12.5 MG/5ML PO ELIX
12.5000 mg | ORAL_SOLUTION | Freq: Once | ORAL | Status: AC
  Administered 2015-02-15: 12.5 mg via ORAL
  Filled 2015-02-15: qty 5

## 2015-02-15 MED ORDER — ACETAMINOPHEN 325 MG PO TABS
650.0000 mg | ORAL_TABLET | Freq: Four times a day (QID) | ORAL | Status: DC | PRN
  Administered 2015-02-18 – 2015-02-20 (×4): 650 mg via ORAL
  Filled 2015-02-15 (×4): qty 2

## 2015-02-15 MED ORDER — SODIUM CHLORIDE 0.9 % IV SOLN: INTRAVENOUS | Status: DC

## 2015-02-15 MED ORDER — MIDAZOLAM HCL 10 MG/2ML IJ SOLN
INTRAMUSCULAR | Status: DC | PRN
  Administered 2015-02-15 (×3): 1 mg via INTRAVENOUS

## 2015-02-15 MED ORDER — SODIUM CHLORIDE 0.9 % IV SOLN
INTRAVENOUS | Status: DC
  Administered 2015-02-16 – 2015-02-17 (×3): via INTRAVENOUS

## 2015-02-15 MED ORDER — MIDAZOLAM HCL 5 MG/ML IJ SOLN
INTRAMUSCULAR | Status: AC
  Filled 2015-02-15: qty 1

## 2015-02-15 MED ORDER — FENTANYL CITRATE (PF) 100 MCG/2ML IJ SOLN
INTRAMUSCULAR | Status: AC
  Filled 2015-02-15: qty 2

## 2015-02-15 MED ORDER — CIPROFLOXACIN IN D5W 400 MG/200ML IV SOLN
400.0000 mg | Freq: Two times a day (BID) | INTRAVENOUS | Status: DC
  Administered 2015-02-15 – 2015-02-18 (×5): 400 mg via INTRAVENOUS
  Filled 2015-02-15 (×5): qty 200

## 2015-02-15 MED ORDER — LOSARTAN POTASSIUM 50 MG PO TABS
100.0000 mg | ORAL_TABLET | Freq: Every day | ORAL | Status: DC
  Filled 2015-02-15: qty 2

## 2015-02-15 NOTE — Progress Notes (Signed)
The patient's sigmoidoscopy exam, unfortunately, is as ambiguous as her CT in differentiating whether her sigmoid mass is benign or malignant. She definitely had deformity and erythema with stricturing of the mid sigmoid colon. There was an area of redness that may reflect the surface of a tumor or simply inflamed mucosa from adjacent diverticular inflammation.  Recommendations are as per at the end of the dictated procedure note.  Please call me if you have any questions.  Cleotis Nipper, M.D. Pager [phone] If no answer or after 5 PM call [phone]

## 2015-02-15 NOTE — Op Note (Signed)
[facility] [place], [id number]   FLEX SIGMOIDOSCOPY PROCEDURE REPORT  PATIENT: Katherine Cooke, Katherine Cooke  MR#: [id number] BIRTHDATE: 29-Mar-1926 , 89  yrs. old GENDER: female ENDOSCOPIST: Ronald Lobo, MD REFERRED BY:  Dr. Leafy Ro PROCEDURE DATE:  28-Feb-2015 PROCEDURE:   Flexible sigmoidoscopy with biopsy INDICATIONS: abdominal pain, bleeding, and mass in the sigmoid region suggestive of a necrotic neoplasm versus diverticular inflammation MEDICATIONS: Versed 3 mg IV  DESCRIPTION OF PROCEDURE:    Physical exam was performed.  Informed consent was obtained from the patient after explaining the benefits, risks, and alternatives to procedure.  the patient was brought from her hospital room to the [facility] long endoscopy unit and time out was performed.The patient was connected to monitor and placed in left lateral position.  Continuous oxygen was provided by nasal cannula and IV medicine administered through an indwelling cannula.  After administration of sedation and rectal exam, the patients rectum was intubated and the Pentax pediatric video endoscope was advanced under direct visualization to the mid sigmoid region at about 25 cm.  The scope was removed slowly by carefully examining the color, texture, anatomy, and integrity mucosa on the way out.  The patient was recovered in endoscopy and discharged home in satisfactory condition. Estimated blood loss is zero unless otherwise noted in this procedure report.     The perianal exam was unremarkable.     using the upper endoscope as a sigmoidoscope, and minimal air insufflation and no prep, so as to minimize the potential for disruption if this was infectious diverticulitis, the scope was advanced to the mid sigmoid area, weren't erythematous area which was unclear whether it was a tumor or simply inflamed mucosa from adjacent diverticulitis. A couple of biopsies were obtained. Up to the limit  of the exam, I encountered multiple diverticula and quite a bit of fixation of the colon.  Retroflexion in the rectum was unremarkable.   PREP QUALITY: fair, but adequate  COMPLICATIONS: None  ENDOSCOPIC IMPRESSION: significant diverticular disease and fixation of the colon, with an erythematous area possibly representing a mass, versus inflamed mucosa. No definitive evidence of an intraluminal tumor.  RECOMMENDATIONS: 1. 1. Await pathology results 2. Would consider surgical consultation. The question is, regardless of whether this is a benign or malignant process, whether the patient might benefit from surgical management 3. Consider stepping up to intravenous antibiotics      _______________________________ eSigned:  Ronald Lobo, MD 2015/02/28 4:14 PM   CPT CODES: ICD CODES:  The ICD and CPT codes recommended by this software are interpretations from the data that the clinical staff has captured with the software.  The verification of the translation of this report to the ICD and CPT codes and modifiers is the sole responsibility of the health care institution and practicing physician where this report was generated.  [employer] will not be held responsible for the validity of the ICD and CPT codes included on this report.  AMA assumes no liability for data contained or not contained herein. CPT is a Designer, television/film set of the [employer].   PATIENT NAME:  Katherine Cooke, Katherine Cooke MR#: [id number]

## 2015-02-15 NOTE — Progress Notes (Addendum)
[facility] Progress Note   [name]  AYT:[id number]  DOB: 1926/04/21  DOA: 02/13/2015 PCP: Idamae Schuller, MD  Brief narrative: Katherine Cooke is a 81 y.o. female the history of hypertension, spinal stenosis who presents with lower abdominal discomfort. She has been having on and off cramping worse for the past week. She has had a mass in the left lower abdomen and troubles with constipation and occasional discomfort in the left lower quadrant for many years has never mentioned it to her PCP.  She had a small amount of blood in her stool in the morning of admission. She states that symptoms have been much worse over the past week. Imaging in the ER reveals a sigmoid mass which is either a malignancy or possible abscess. She is also found to have leukocytosis. She is admitted for further admitted with IV antibiotics and workup.   Subjective: Continued dull pain in abdomen. No nausea vomiting or diarrhea. No cough or shortness of breath.  Assessment/Plan: Principal Problem:   Colonic mass - Sigmoid mass suggestive of malignancy versus abscess with leukocytosis - Will have sigmoidoscopy today-diet per GI  -cont ciprofloxacin and Flagyl Addendum : scope reveals mass with a stricture- surgery consult recommended by GI- I have called and requested a consult  Active Problems:   Hypertension - Hypotensive intermittently -Holding parameters placed on losartan -Hold HCTZ as she appears dehydrated  Acute Renal failure/hyponatremia/elevated BUN/creatinine ratio -Prerenal acute renal failure -Creatinine of 1.15 on admission improved to 0.82 with hydration -Low sodium level and BUN/creatinine ratio consistent with dehydration -We will continue slow IV fluids  - hold HCTZ    Anemia -Anemia panel reveals anemia of chronic disease   Code Status:     Code Status Orders        Start     Ordered   02/13/15 2221  Full code   Continuous     02/13/15 2220    Advance  Directive Documentation        Most Recent Value   Type of Advance Directive  Living will, Healthcare Power of Attorney   Pre-existing out of facility DNR order (yellow form or pink MOST form)     "MOST" Form in Place?       Family Communication:  Disposition Plan:  home when stable DVT prophylaxis: SCDs Consultants: GI consult Procedures: Appt with PCP: Requested Antibiotics: Anti-infectives    Start     Dose/Rate Route Frequency Ordered Stop   02/15/15 1200  amoxicillin-clavulanate (AUGMENTIN) 875-125 MG per tablet 1 tablet     1 tablet Oral Every 12 hours 02/15/15 1058     02/14/15 2200  piperacillin-tazobactam (ZOSYN) IVPB 3.375 g  Status:  Discontinued     3.375 g 12.5 mL/hr over 240 Minutes Intravenous 3 times per day 02/14/15 2051 02/15/15 1105   02/13/15 1900  metroNIDAZOLE (FLAGYL) tablet 500 mg     500 mg Oral  Once 02/13/15 1847 02/13/15 1904   02/13/15 1900  ciprofloxacin (CIPRO) tablet 500 mg     500 mg Oral  Once 02/13/15 1847 02/13/15 1904      Objective: Filed Weights   02/13/15 2116  Weight: 48.535 kg (107 lb)    Intake/Output Summary (Last 24 hours) at 02/15/15 1432 Last data filed at 02/14/15 1443  Gross per 24 hour  Intake  577.5 ml  Output      0 ml  Net  577.5 ml     Vitals Filed Vitals:   02/14/15 1437 02/14/15  2210 02/15/15 0632 02/15/15 0956  BP: 107/48 127/51 122/60 112/49  Pulse: 92 90 89 85  Temp: 97.7 F (36.5 C) 98.7 F (37.1 C) 98.2 F (36.8 C)   TempSrc: Oral Oral Oral   Resp: '16 18 18   '$ Height:      Weight:      SpO2: 99% 98% 98%     Exam:  General:  Pt is alert, not in acute distress  HEENT: No icterus, No thrush, oral mucosa moist  Cardiovascular: regular rate and rhythm, S1/S2 No murmur  Respiratory: clear to auscultation bilaterally   Abdomen: Soft, +Bowel sounds, tender in left lower quadrant and lower mid abdomen with palpable mass, non distended, no guarding  MSK: No LE edema, cyanosis or clubbing  Data  Reviewed: Basic Metabolic Panel:  Recent Labs Lab 02/13/15 1217 02/14/15 0512 02/15/15 0404  NA 131* 132* 133*  K 4.1 3.6 3.8  CL 96* 100* 101  CO2 '26 26 26  '$ GLUCOSE 105* 124* 107*  BUN '19 20 11  '$ CREATININE 1.15* 0.95 0.82  CALCIUM 9.0 8.1* 8.2*  MG  --  2.1  --   PHOS  --  3.6  --    Liver Function Tests:  Recent Labs Lab 02/13/15 1217 02/14/15 0512  AST 22 38  ALT 13* 13*  ALKPHOS 86 73  BILITOT 0.8 0.3  PROT 7.1 5.8*  ALBUMIN 3.1* 2.5*    Recent Labs Lab 02/13/15 1217  LIPASE 18*   No results for input(s): AMMONIA in the last 168 hours. CBC:  Recent Labs Lab 02/13/15 1217 02/14/15 0512 02/15/15 0404  WBC 16.3* 13.1* 12.0*  HGB 11.3* 10.1* 9.4*  HCT 33.2* 29.9* 28.2*  MCV 98.2 98.0 98.6  PLT 472* 457* 422*   Cardiac Enzymes: No results for input(s): CKTOTAL, CKMB, CKMBINDEX, TROPONINI in the last 168 hours. BNP (last 3 results) No results for input(s): BNP in the last 8760 hours.  ProBNP (last 3 results) No results for input(s): PROBNP in the last 8760 hours.  CBG: No results for input(s): GLUCAP in the last 168 hours.  No results found for this or any previous visit (from the past 240 hour(s)).   Studies: Ct Abdomen Pelvis W Contrast  02/13/2015   CLINICAL DATA:  Left lower quadrant pain for 1 week. New right lower quadrant pain. Leukocytosis. 20 pound weight loss over the past 1 year.  EXAM: CT ABDOMEN AND PELVIS WITH CONTRAST  TECHNIQUE: Multidetector CT imaging of the abdomen and pelvis was performed using the standard protocol following bolus administration of intravenous contrast.  CONTRAST:  24m OMNIPAQUE IOHEXOL 300 MG/ML SOLN, 268mOMNIPAQUE IOHEXOL 300 MG/ML SOLN  COMPARISON:  Abdominal radiographs 10/13/2014  FINDINGS: Minimal dependent atelectasis is present in the lung bases. There are several small right lower lobe lung nodules which measure up to 3 mm in size (series 7, image 1). Mitral annular calcification is partially visualized.   A 1.4 cm lesion in the lateral segment of the left hepatic lobe is hypoattenuating relative to adjacent liver but demonstrates attenuation greater water. Than there is minimal central intrahepatic biliary dilatation. The common bile duct is upper limits of normal in size, measuring 9 mm in diameter. The pancreatic duct is minimally prominent in the region of the head, measuring 3 mm. The gallbladder, spleen, and adrenal glands are unremarkable. Subcentimeter low-density lesions in the kidneys are too small to characterize but most likely represent cysts. There is a 2.4 cm cyst in the upper pole of  the left kidney. There is also a 4 mm fat density lesion in the interpolar right kidney which may represent a tiny angiomyolipoma.  There is diffuse left-sided colonic diverticulosis. There is extensive masslike soft tissue thickening involving the sigmoid colon measuring approximately 7.6 x 7.7 x 6.3 cm in total, with a lower density component extending laterally and measuring 4.8 x 4.5 x 4.0 cm. There are multiple adjacent subcentimeter pericolonic lymph nodes. A larger left external iliac/ pericolonic lymph node measures 1.0 cm in short axis. Multiple mildly prominent left para-aortic lymph nodes measure up to 1.0 cm in short axis.  No intraperitoneal free air or free fluid is seen. There is no evidence of bowel obstruction. There is rather extensive aortoiliac atherosclerotic calcification. The sigmoid colon the mass is inseparable from the uterine fundus and may involve the uterus directly. No suspicious lytic or blastic osseous lesions are identified. Advanced disc degeneration is present in the lower lumbar spine.  IMPRESSION: 1. Masslike soft tissue involving the sigmoid colon, most concerning for primary colonic malignancy with necrotic component or less likely abscess. While there is extensive left-sided colonic diverticulosis, this appears more complex and masslike than typical perforated diverticulitis and  abscess formation. 2. Small pelvic and retroperitoneal lymph nodes, indeterminate but could represent nodal metastatic disease. 3. 1.4 cm hypoattenuating liver lesion, indeterminate although a metastasis is not excluded. 4. Tiny right lower lobe lung nodules measuring 2-3 mm in size. These results were called by telephone at the time of interpretation on 02/13/2015 at 5:51 pm to Dr. Theotis Burrow , who verbally acknowledged these results.   Electronically Signed   By: Logan Bores M.D.   On: 02/13/2015 17:55    Scheduled Meds:  Scheduled Meds: . amoxicillin-clavulanate  1 tablet Oral Q12H  . cycloSPORINE  1 drop Both Eyes BID  . docusate sodium  200 mg Oral QHS  . losartan  100 mg Oral Daily  . montelukast  10 mg Oral QHS  . saccharomyces boulardii  250 mg Oral BID   Continuous Infusions: . sodium chloride      Time spent on care of this patient: 35 min   [place], MD 02/15/2015, 2:32 PM  LOS: 2 days   [facility] Office  [phone] Pager - Text Page per [email or web address] If 7PM-7AM, please contact night-coverage [email or web address]

## 2015-02-15 NOTE — Care Management Important Message (Signed)
Important Message  Patient Details  Name: Katherine Cooke MRN: [id number] Date of Birth: 1925-07-25   Medicare Important Message Given:  Yes-second notification given    Camillo Flaming 02/15/2015, 11:57 AMImportant Message  Patient Details  Name: Katherine Cooke MRN: [id number] Date of Birth: November 23, 1925   Medicare Important Message Given:  Yes-second notification given    Camillo Flaming 02/15/2015, 11:57 AM

## 2015-02-15 NOTE — Consult Note (Signed)
Reason for Consult:  Colon mass Referring Physician: Dr. Reggy Eye PCP:  Idamae Schuller, MD  Katherine Cooke is an 81 y.o. female.   HPI: 81 y/o female admitted 02/14/15 from the ED with abdominal pain for about 1 week.  She says she has had a lump in her LLQ for 5 years with alternating periods of constipation and diarrhea. She has occasional hematochezia, decreased appetite, dyspepsia, abdominal distension and 20 pound weight loss over the last year. She takes a laxative or several types of laxatives to get relief, the lump disappears and she is good till the next bout of constipation that requires her to start the process over again. She has not had a colonoscopy for about 10 years.  Dr. Lucio Edward did the last one. This weekend on 02/13/15 she was going to a bridal shower for her granddaughter.  She says she had pain so severe she knew she could not drive.  Her granddaughter insisted she go see some one so she went to the ED last PM.  She reports new urgency and stool and urinary incontinence also. Work up  in the ED shows WBC up some, H/H low but not to unreasonable for an 81 y/o woman.  CT scan showed a Masslike soft tissue involving the sigmoid colon, most concerning for primary colonic malignancy with necrotic component or less likely abscess. While there is extensive left-sided colonic diverticulosis, this appears more complex  extensive masslike soft tissue thickening involving the sigmoid colon measuring approximately 7.6 x 7.7 x 6.3 cm in total, with a lower density component extending laterally and measuring 4.8 x 4.5 x 4.0 cm. There are multiple adjacent subcentimeter pericolonic lymph nodes. A larger left external iliac/ pericolonic lymph node measures 1.0 cm in short axis. Multiple mildly prominent left para-aortic lymph nodes measure up to 1.0 cm in short axis.  A 1.4 cm hypoattenuating liver lesion, indeterminate although a metastasis is not excluded.   Today Dr. Cristina Gong did a Flexible  sigmoidoscopy with Bx.  Report shows:  significant diverticular disease and fixation of the colon, with an erythematous area possibly representing a mass, versus inflamed mucosa. No definitive evidence of an intraluminal tumor.  She is currently on Cipro and Flagyl, along with a clear diet.  She has 1 stool recorded so she is not completely obstructed at this point.  Biopsy results are pending. We are ask to see.   Past Medical History  Diagnosis Date  . Hypertension   . Spinal stenosis   . Basal cell carcinoma of nose 2005  . Squamous cell carcinoma of left hip 2005    Past Surgical History  Procedure Laterality Date  . Breast biopsy x 3 - Benign  1950's ; 1952 ; 1954  . Tonsillectomy      Family History  Problem Relation Age of Onset  . Breast cancer Maternal Aunt   . Ovarian cancer Sister   . Heart disease Father   . Ulcers Father   . COPD Mother     Social History:  reports that she has never smoked. She has never used smokeless tobacco. She reports that she drinks about 4.2 oz of alcohol per week. She reports that she does not use illicit drugs.  She is widowed and independent.   Allergies:  Allergies  Allergen Reactions  . Lyrica [Pregabalin] Shortness Of Breath    Prior to Admission medications   Medication Sig Start Date End Date Taking? Authorizing Provider  ALPRAZolam Duanne Moron) 0.25 MG tablet Take  0.25 mg by mouth daily as needed for anxiety or sleep.  08/01/13  Yes Historical Provider, MD  Ascorbic Acid (VITAMIN C PO) Take 1 tablet by mouth daily.    Yes Historical Provider, MD  aspirin 81 MG tablet Take 81 mg by mouth daily.   Yes Historical Provider, MD  bisacodyl (DULCOLAX) 5 MG EC tablet Take 5 mg by mouth daily as needed for mild constipation or moderate constipation.   Yes Historical Provider, MD  Ca Carbonate-Mag Hydroxide (ROLAIDS PO) Take 2 tablets by mouth 2 (two) times daily as needed (indigestion).   Yes Historical Provider, MD  cholecalciferol (VITAMIN  D) 1000 UNITS tablet Take 1,000 Units by mouth daily.   Yes Historical Provider, MD  cycloSPORINE (RESTASIS) 0.05 % ophthalmic emulsion Place 1 drop into both eyes 2 (two) times daily.   Yes Historical Provider, MD  diclofenac sodium (VOLTAREN) 1 % GEL Apply 4 g topically daily as needed (pain).  10/15/14  Yes Historical Provider, MD  ibandronate (BONIVA) 150 MG tablet Take 150 mg by mouth every 30 (thirty) days. Take in the morning with a full glass of water, on an empty stomach, and do not take anything else by mouth or lie down for the next 30 min.   Yes Historical Provider, MD  losartan-hydrochlorothiazide (HYZAAR) 100-12.5 MG per tablet Take 1 tablet by mouth daily.  07/23/13  Yes Historical Provider, MD  MAGNESIUM PO Take 1 tablet by mouth daily.    Yes Historical Provider, MD  montelukast (SINGULAIR) 10 MG tablet Take 10 mg by mouth at bedtime. 01/25/15  Yes Historical Provider, MD  Multiple Vitamins-Minerals (EYE VITAMINS PO) Take by mouth.   Yes Historical Provider, MD  Omega-3 Fatty Acids (FISH OIL PO) Take 2 capsules by mouth 2 (two) times daily.    Yes Historical Provider, MD  polyethylene glycol (MIRALAX / GLYCOLAX) packet Take 17 g by mouth daily as needed for mild constipation or moderate constipation.   Yes Historical Provider, MD  triazolam (HALCION) 0.25 MG tablet Take 0.25 mg by mouth at bedtime as needed for sleep.  12/10/14  Yes Historical Provider, MD     Results for orders placed or performed during the hospital encounter of 02/13/15 (from the past 48 hour(s))  Urinalysis, Routine w reflex microscopic (not at [facility])     Status: Abnormal   Collection Time: 02/13/15  4:48 PM  Result Value Ref Range   Color, Urine YELLOW YELLOW   APPearance CLEAR CLEAR   Specific Gravity, Urine 1.016 1.005 - 1.030   pH 7.0 5.0 - 8.0   Glucose, UA NEGATIVE NEGATIVE mg/dL   Hgb urine dipstick NEGATIVE NEGATIVE   Bilirubin Urine NEGATIVE NEGATIVE   Ketones, ur NEGATIVE NEGATIVE mg/dL   Protein,  ur NEGATIVE NEGATIVE mg/dL   Urobilinogen, UA 0.2 0.0 - 1.0 mg/dL   Nitrite NEGATIVE NEGATIVE   Leukocytes, UA SMALL (A) NEGATIVE  Urine microscopic-add on     Status: Abnormal   Collection Time: 02/13/15  4:48 PM  Result Value Ref Range   WBC, UA 0-2 <3 WBC/hpf   Bacteria, UA RARE RARE   Casts HYALINE CASTS (A) NEGATIVE  I-Stat CG4 Lactic Acid, ED     Status: None   Collection Time: 02/13/15  7:23 PM  Result Value Ref Range   Lactic Acid, Venous 0.60 0.5 - 2.0 mmol/L  Comprehensive metabolic panel     Status: Abnormal   Collection Time: 02/14/15  5:12 AM  Result Value Ref Range  Sodium 132 (L) 135 - 145 mmol/L   Potassium 3.6 3.5 - 5.1 mmol/L   Chloride 100 (L) 101 - 111 mmol/L   CO2 26 22 - 32 mmol/L   Glucose, Bld 124 (H) 65 - 99 mg/dL   BUN 20 6 - 20 mg/dL   Creatinine, Ser 0.95 0.44 - 1.00 mg/dL   Calcium 8.1 (L) 8.9 - 10.3 mg/dL   Total Protein 5.8 (L) 6.5 - 8.1 g/dL   Albumin 2.5 (L) 3.5 - 5.0 g/dL   AST 38 15 - 41 U/L   ALT 13 (L) 14 - 54 U/L   Alkaline Phosphatase 73 38 - 126 U/L   Total Bilirubin 0.3 0.3 - 1.2 mg/dL   GFR calc non Af Amer 52 (L) >60 mL/min   GFR calc Af Amer >60 >60 mL/min    Comment: (NOTE) The eGFR has been calculated using the CKD EPI equation. This calculation has not been validated in all clinical situations. eGFR's persistently <60 mL/min signify possible Chronic Kidney Disease.    Anion gap 6 5 - 15  CBC     Status: Abnormal   Collection Time: 02/14/15  5:12 AM  Result Value Ref Range   WBC 13.1 (H) 4.0 - 10.5 K/uL   RBC 3.05 (L) 3.87 - 5.11 MIL/uL   Hemoglobin 10.1 (L) 12.0 - 15.0 g/dL   HCT 29.9 (L) 36.0 - 46.0 %   MCV 98.0 78.0 - 100.0 fL   MCH 33.1 26.0 - 34.0 pg   MCHC 33.8 30.0 - 36.0 g/dL   RDW 13.1 11.5 - 15.5 %   Platelets 457 (H) 150 - 400 K/uL  Magnesium     Status: None   Collection Time: 02/14/15  5:12 AM  Result Value Ref Range   Magnesium 2.1 1.7 - 2.4 mg/dL  Phosphorus     Status: None   Collection Time:  02/14/15  5:12 AM  Result Value Ref Range   Phosphorus 3.6 2.5 - 4.6 mg/dL  Ferritin     Status: None   Collection Time: 02/14/15  5:12 AM  Result Value Ref Range   Ferritin 219 11 - 307 ng/mL    Comment: Performed at [facility]  Iron and TIBC     Status: Abnormal   Collection Time: 02/14/15  5:12 AM  Result Value Ref Range   Iron 15 (L) 28 - 170 ug/dL   TIBC 196 (L) 250 - 450 ug/dL   Saturation Ratios 8 (L) 10.4 - 31.8 %   UIBC 181 ug/dL    Comment: Performed at [facility]  Vitamin B12     Status: Abnormal   Collection Time: 02/14/15  5:12 AM  Result Value Ref Range   Vitamin B-12 2084 (H) 180 - 914 pg/mL    Comment: (NOTE) This assay is not validated for testing neonatal or myeloproliferative syndrome specimens for Vitamin B12 levels. Performed at [facility]   Folate     Status: None   Collection Time: 02/14/15  5:12 AM  Result Value Ref Range   Folate 32.0 >5.9 ng/mL    Comment: Performed at [facility]  CBC     Status: Abnormal   Collection Time: 02/15/15  4:04 AM  Result Value Ref Range   WBC 12.0 (H) 4.0 - 10.5 K/uL   RBC 2.86 (L) 3.87 - 5.11 MIL/uL   Hemoglobin 9.4 (L) 12.0 - 15.0 g/dL   HCT 28.2 (L) 36.0 - 46.0 %   MCV  98.6 78.0 - 100.0 fL   MCH 32.9 26.0 - 34.0 pg   MCHC 33.3 30.0 - 36.0 g/dL   RDW 12.9 11.5 - 15.5 %   Platelets 422 (H) 150 - 400 K/uL  Basic metabolic panel     Status: Abnormal   Collection Time: 02/15/15  4:04 AM  Result Value Ref Range   Sodium 133 (L) 135 - 145 mmol/L   Potassium 3.8 3.5 - 5.1 mmol/L   Chloride 101 101 - 111 mmol/L   CO2 26 22 - 32 mmol/L   Glucose, Bld 107 (H) 65 - 99 mg/dL   BUN 11 6 - 20 mg/dL   Creatinine, Ser 0.82 0.44 - 1.00 mg/dL   Calcium 8.2 (L) 8.9 - 10.3 mg/dL   GFR calc non Af Amer >60 >60 mL/min   GFR calc Af Amer >60 >60 mL/min    Comment: (NOTE) The eGFR has been calculated using the CKD EPI equation. This calculation has not been validated in all clinical  situations. eGFR's persistently <60 mL/min signify possible Chronic Kidney Disease.    Anion gap 6 5 - 15    Ct Abdomen Pelvis W Contrast  02/13/2015   CLINICAL DATA:  Left lower quadrant pain for 1 week. New right lower quadrant pain. Leukocytosis. 20 pound weight loss over the past 1 year.  EXAM: CT ABDOMEN AND PELVIS WITH CONTRAST  TECHNIQUE: Multidetector CT imaging of the abdomen and pelvis was performed using the standard protocol following bolus administration of intravenous contrast.  CONTRAST:  34mL OMNIPAQUE IOHEXOL 300 MG/ML SOLN, 19mL OMNIPAQUE IOHEXOL 300 MG/ML SOLN  COMPARISON:  Abdominal radiographs 10/13/2014  FINDINGS: Minimal dependent atelectasis is present in the lung bases. There are several small right lower lobe lung nodules which measure up to 3 mm in size (series 7, image 1). Mitral annular calcification is partially visualized.  A 1.4 cm lesion in the lateral segment of the left hepatic lobe is hypoattenuating relative to adjacent liver but demonstrates attenuation greater water. Than there is minimal central intrahepatic biliary dilatation. The common bile duct is upper limits of normal in size, measuring 9 mm in diameter. The pancreatic duct is minimally prominent in the region of the head, measuring 3 mm. The gallbladder, spleen, and adrenal glands are unremarkable. Subcentimeter low-density lesions in the kidneys are too small to characterize but most likely represent cysts. There is a 2.4 cm cyst in the upper pole of the left kidney. There is also a 4 mm fat density lesion in the interpolar right kidney which may represent a tiny angiomyolipoma.  There is diffuse left-sided colonic diverticulosis. There is extensive masslike soft tissue thickening involving the sigmoid colon measuring approximately 7.6 x 7.7 x 6.3 cm in total, with a lower density component extending laterally and measuring 4.8 x 4.5 x 4.0 cm. There are multiple adjacent subcentimeter pericolonic lymph nodes. A  larger left external iliac/ pericolonic lymph node measures 1.0 cm in short axis. Multiple mildly prominent left para-aortic lymph nodes measure up to 1.0 cm in short axis.  No intraperitoneal free air or free fluid is seen. There is no evidence of bowel obstruction. There is rather extensive aortoiliac atherosclerotic calcification. The sigmoid colon the mass is inseparable from the uterine fundus and may involve the uterus directly. No suspicious lytic or blastic osseous lesions are identified. Advanced disc degeneration is present in the lower lumbar spine.  IMPRESSION: 1. Masslike soft tissue involving the sigmoid colon, most concerning for primary colonic malignancy with necrotic  component or less likely abscess. While there is extensive left-sided colonic diverticulosis, this appears more complex and masslike than typical perforated diverticulitis and abscess formation. 2. Small pelvic and retroperitoneal lymph nodes, indeterminate but could represent nodal metastatic disease. 3. 1.4 cm hypoattenuating liver lesion, indeterminate although a metastasis is not excluded. 4. Tiny right lower lobe lung nodules measuring 2-3 mm in size. These results were called by telephone at the time of interpretation on 02/13/2015 at 5:51 pm to Dr. Theotis Burrow , who verbally acknowledged these results.   Electronically Signed   By: Logan Bores M.D.   On: 02/13/2015 17:55    Review of Systems  Constitutional: Positive for weight loss (20 pounds last year).  HENT: Positive for congestion (for 5 years) and hearing loss (some she has not seen anyone about it.).   Eyes: Negative.   Respiratory: Positive for cough (chronic cough).   Cardiovascular: Negative.        Had some with Lyrica, in the past, none for 7 years as best she can remember.   Gastrointestinal: Positive for abdominal pain (usually LLQ with constipation, gets better with laxitives, but reoccurs with constipation), constipation and blood in stool  (occasionally with constipation, maroon colored stools.). Negative for heartburn, nausea and vomiting.  Genitourinary: Positive for urgency.       Some urgency with voiding after sitting for a period.  Musculoskeletal: Positive for back pain and joint pain.  Skin: Negative.   Neurological: Negative.   Endo/Heme/Allergies: Negative.   Psychiatric/Behavioral: Negative.    Blood pressure 119/46, pulse 89, temperature 98.6 F (37 C), temperature source Oral, resp. rate 16, height $RemoveBe'4\' 11"'DCWiyrzcS$  (1.499 m), weight 46.72 kg (103 lb), SpO2 100 %. Physical Exam  Constitutional: She is oriented to person, place, and time. No distress.  Thin cachectic WF NAD  HENT:  Head: Normocephalic and atraumatic.  Nose: Nose normal.  Eyes: Conjunctivae and EOM are normal. Right eye exhibits no discharge. Left eye exhibits no discharge. No scleral icterus.  Neck: Normal range of motion. Neck supple. No JVD present. No tracheal deviation present. No thyromegaly present.  Cardiovascular: Normal rate, regular rhythm, normal heart sounds and intact distal pulses.   No murmur heard. Respiratory: Effort normal and breath sounds normal. No respiratory distress. She has no wheezes. She has no rales. She exhibits no tenderness.  GI: Soft. Bowel sounds are normal. She exhibits no distension and no mass. There is tenderness (SOME TENDERNESS BOTH LEFT AND RIGHT LOWER QUADRANTS; THE MASS SHE FEELS WITH CONSTIPATION IS NOT CURRENTLY PRESENT.). There is no rebound and no guarding.  Musculoskeletal: She exhibits no edema or tenderness.  Lymphadenopathy:    She has no cervical adenopathy.  Neurological: She is alert and oriented to person, place, and time. No cranial nerve deficit.  Skin: Skin is warm and dry. No rash noted. She is not diaphoretic. No erythema. No pallor.  Psychiatric: She has a normal mood and affect. Her behavior is normal. Judgment and thought content normal.    Assessment/Plan: Sigmoid colon mass with  progressive pain and partial obstruction Possible liver metastasis  Weight loss Spinal stenosis Hypertension Hx of basal and squamous cell  Skin cancers Arthritis with some hand changes.  Plan:  I would keep her on clears, start a gentle bowel prep and await biopsy.  Dr. Hassell Done will see tomorrow.  If she needs surgery she will need medical clearance for surgery.   Echo is pending.  Collen Vincent 02/15/2015, 4:40 PM

## 2015-02-16 ENCOUNTER — Encounter (HOSPITAL_COMMUNITY): Payer: Self-pay | Admitting: Radiology

## 2015-02-16 ENCOUNTER — Inpatient Hospital Stay (HOSPITAL_COMMUNITY): Payer: Medicare Other

## 2015-02-16 LAB — BASIC METABOLIC PANEL
Anion gap: 6 (ref 5–15)
BUN: 7 mg/dL (ref 6–20)
CALCIUM: 8 mg/dL — AB (ref 8.9–10.3)
CHLORIDE: 103 mmol/L (ref 101–111)
CO2: 24 mmol/L (ref 22–32)
CREATININE: 0.72 mg/dL (ref 0.44–1.00)
GFR calc Af Amer: >60 mL/min (ref >60)
GFR calc non Af Amer: >60 mL/min (ref >60)
GLUCOSE: 97 mg/dL (ref 65–99)
Potassium: 3.7 mmol/L (ref 3.5–5.1)
Sodium: 133 mmol/L — ABNORMAL LOW (ref 135–145)

## 2015-02-16 LAB — GLUCOSE, CAPILLARY: Glucose-Capillary: 98 mg/dL (ref 65–99)

## 2015-02-16 LAB — CBC
HEMATOCRIT: 27.9 % — AB (ref 36.0–46.0)
Hemoglobin: 9.4 g/dL — ABNORMAL LOW (ref 12.0–15.0)
MCH: 33.1 pg (ref 26.0–34.0)
MCHC: 33.7 g/dL (ref 30.0–36.0)
MCV: 98.2 fL (ref 78.0–100.0)
Platelets: 397 10*3/uL (ref 150–400)
RBC: 2.84 MIL/uL — ABNORMAL LOW (ref 3.87–5.11)
RDW: 12.8 % (ref 11.5–15.5)
WBC: 10.6 10*3/uL — ABNORMAL HIGH (ref 4.0–10.5)

## 2015-02-16 LAB — PREALBUMIN: Prealbumin: 5.9 mg/dL — ABNORMAL LOW (ref 18–38)

## 2015-02-16 MED ORDER — DIPHENHYDRAMINE HCL 25 MG PO CAPS
25.0000 mg | ORAL_CAPSULE | Freq: Once | ORAL | Status: AC
  Administered 2015-02-16: 25 mg via ORAL
  Filled 2015-02-16: qty 1

## 2015-02-16 MED ORDER — DIPHENHYDRAMINE HCL 25 MG PO CAPS
25.0000 mg | ORAL_CAPSULE | Freq: Four times a day (QID) | ORAL | Status: DC | PRN
Start: 2015-02-16 — End: 2015-02-16

## 2015-02-16 MED ORDER — PEG 3350-KCL-NA BICARB-NACL 420 G PO SOLR
4000.0000 mL | Freq: Once | ORAL | Status: AC
  Administered 2015-02-16: 4000 mL via ORAL
  Filled 2015-02-16: qty 4000

## 2015-02-16 MED ORDER — POLYETHYLENE GLYCOL 3350 17 G PO PACK
17.0000 g | PACK | Freq: Every day | ORAL | Status: DC
  Administered 2015-02-16: 17 g via ORAL

## 2015-02-16 MED ORDER — DIPHENHYDRAMINE HCL 12.5 MG/5ML PO ELIX: 12.5000 mg | ORAL_SOLUTION | Freq: Three times a day (TID) | ORAL | Status: DC | PRN

## 2015-02-16 MED ORDER — BOOST / RESOURCE BREEZE PO LIQD
1.0000 | Freq: Three times a day (TID) | ORAL | Status: DC
  Administered 2015-02-16 – 2015-02-19 (×7): 1 via ORAL
  Filled 2015-02-16 (×9): qty 1

## 2015-02-16 NOTE — Progress Notes (Addendum)
RN, Ailene Ravel, paged this NP out of concern for pt being confused and not herself. Son was talking with pt on the phone and stated he thought she was having a stroke because she wasn't making any sense with conversation. NP spoke with RN over the phone. Pt has some confusion and is answering some questions incorrectly, but there are no signs of weakness. NP to bedside. Writer reviewed her chart. Pt found to have new dx of abd mass with adenocarcinoma and surgery is being considered. Pt was told this today. Writer reviewed MAR and pt has not had any pain meds since about 12 noon today.  S: Pt denies pain, HA, dizziness. Feels weak but "all over" and says that is why she is at the hospital. "energy". Says her right eye is bothering her but it does that at home as well. Denies eye pain or double vision. Mouth is dry. Admits to some memory trouble at home. Says son takes care of her finances.  O: Well appearing elderly WF in NAD. VSS. Alert. Knows her name, where she lives, the month, and a little bit about why she is in the hospital. States she is in "[facility]". She doesn't know the date, year (says 2017), or president. Some difficulty noted with word finding. PERRL at 41m bilaterally. Follows all commands quickly. Strength exam is 5/5 throughout. ? Facial droop (per son) but smile is symmetrical and her facial strength is 5/5. Tongue is midline. No pronator drift. Heel to shin intact. Sensation is intact throughout. Pt was OOB and walked to wheelchair for CT.  A/P: 1. Confusion-her neuro exam is intact except for the slight confusion and difficulty with word finding at times. Son came to hospital as wProbation officerwas in the room. Explained findings to son and my thoughts on this confusion likely being attributable to infection itself, side effect of Cipro, ? memory difficulties at home with hospital delirium, etc. However, given acute change (per son), will go ahead and get stat CT head without contrast. Get some  stat labs to evaluate for worsening infection or metabolic encephalopathy.  Will follow after CT.  KClance Boll NP [facility] Update: CT head negative. Will follow labs. Discussed with family at bedside.  KJKG, NP Update: Labs are stable except for slightly more elevated WBCC. Still confused. ? MRI brain this am. Will defer to attending. KJKG, NP

## 2015-02-16 NOTE — Progress Notes (Signed)
Initial Nutrition Assessment  DOCUMENTATION CODES:   Severe malnutrition in context of chronic illness  INTERVENTION:   -Continue Boost Breeze po TID, each supplement provides 250 kcal and 9 grams of protein -When diet advanced past clears, recommend daily snacks and Ensure supplements BID -Encourage PO intake (small frequent meals) -RD to continue to monitor  NUTRITION DIAGNOSIS:   Malnutrition related to chronic illness as evidenced by energy intake < or equal to 75% for > or equal to 1 month, percent weight loss, moderate depletion of body fat, severe depletion of muscle mass.  GOAL:   Patient will meet greater than or equal to 90% of their needs  MONITOR:   PO intake, Supplement acceptance, Diet advancement, Labs, Weight trends, Skin, I & O's  REASON FOR ASSESSMENT:   Consult, Malnutrition Screening Tool Assessment of nutrition requirement/status  ASSESSMENT:   81 y.o. 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 She says that she has felt a lump in the left lower quadrant for about 5 years, and during this time has had alternating periods of diarrhea and constipation. However she has not had episodes of severe abdominal pain like this past week. She reports the occasional hematochezia, decreased appetite, dyspepsia, abdominal distention and has had 20 pound weight loss in the last year.   Pt reports poor appetite and eating less than usual PTA. Pt states that she was earlier this year trying to lose weight but the most recent weight loss has been unintentional. Pt now on clear liquids, pt is not consuming much (~25%). Encouraged pt to sip on liquids and supplements throughout the day. Pt interested in a different supplement when diet advanced. Pt would benefit from  snacks as well.  Per weight history documentation, pt has lost 9 lb since 8/10 (8% weight loss x 1 month, significant for time frame).  Nutrition-Focused physical exam completed. Findings are moderate fat depletion, severe muscle depletion, and no edema.   Labs reviewed: Low Na Mg/Phos WNL  Diet Order:  Diet clear liquid Room service appropriate?: Yes; Fluid consistency:: Thin  Skin:  Reviewed, no issues  Last BM:  9/12  Height:   Ht Readings from Last 1 Encounters:  02/15/15 '4\' 11"'$  (1.499 m)    Weight:   Wt Readings from Last 1 Encounters:  02/15/15 103 lb (46.72 kg)    Ideal Body Weight:  44.8 kg  BMI:  Body mass index is 20.79 kg/(m^2).  Estimated Nutritional Needs:   Kcal:  1400-1600  Protein:  70-80g  Fluid:  1.5L/day  EDUCATION NEEDS:   No education needs identified at this time  Clayton Bibles, MS, RD, LDN Pager: [phone] After Hours Pager: [phone]

## 2015-02-16 NOTE — Progress Notes (Signed)
Received call from Dr. Tresa Moore of Pathology:  bx's are positive for poorly differentiated adenocarcinoma.  Morphologically, it appears to be arising from outside the colon, and this was my endoscopic impression as well.  Dr. Wynelle Cleveland and Mr. Creig Hines (Surg) notified.  I have not yet had the opportunity to talk with the patient about this.  Cleotis Nipper, M.D. Pager [phone] If no answer or after 5 PM call [phone]

## 2015-02-16 NOTE — Progress Notes (Signed)
CHART NOTE   This is a very pleasant 81 years old white female who was recently admitted for evaluation of left lower quadrant abdominal pain of one week duration but she mentions that she has had pain for a while over a year with her recent weight loss. She had CT scan of the abdomen and pelvis performed on 02/13/2015 and it showed extensive masslike soft tissue thickening involving the sigmoid colon with questionable abdominal lymphadenopathy. The patient underwent sigmoidoscopy yesterday under the care of Dr. Cristina Gong and it showed significant diverticular disease and fixation of the colon with an erythematous area possibly representing a mass but no definitive evidence of an intra-lumen or tumor. Biopsies were performed and preliminary report from the hospitalist mentioned adenocarcinoma. The final pathology is still pending. The patient was seen by surgery but no decision has been made yet regarding the surgical procedure. I have brief discussion with the patient and her son about her condition. I recommended for the patient to see the multidisciplinary gastrointestinal oncology team. I forward her information to the GI navigator. She will be seen by medical oncology on an outpatient basis after the surgical decision. The patient and her son agreed to the current plan.

## 2015-02-16 NOTE — Plan of Care (Signed)
Patient continues to complain of itching but no PRN in chart.  Dr. Ree Kida.

## 2015-02-16 NOTE — Progress Notes (Signed)
1 Day Post-Op  Subjective: Some bloating but no big complaints, still some discomfort lower abdomen.    Objective: Vital signs in last 24 hours: Temp:  [98.1 F (36.7 C)-98.6 F (37 C)] 98.6 F (37 C) (09/13 0641) Pulse Rate:  [83-102] 88 (09/13 0641) Resp:  [15-29] 18 (09/13 0641) BP: (109-142)/(34-100) 109/45 mmHg (09/13 0641) SpO2:  [95 %-100 %] 95 % (09/13 0641) Weight:  [46.72 kg (103 lb)] 46.72 kg (103 lb) (09/12 1448) Last BM Date: 02/15/15 480 PO on Clear diet Afebrile, VSS Labs OK, except prealbumin is very low 5.9 Intake/Output from previous day: 09/12 0701 - 09/13 0700 In: 1635 [P.O.:480; I.V.:1055; IV Piggyback:100] Out: -  Intake/Output this shift:    General appearance: alert, cooperative and no distress Resp: clear to auscultation bilaterally GI: soft, mildly distended, + BS,   Lab Results:   Recent Labs  02/15/15 0404 02/16/15 0421  WBC 12.0* 10.6*  HGB 9.4* 9.4*  HCT 28.2* 27.9*  PLT 422* 397    BMET  Recent Labs  02/15/15 0404 02/16/15 0421  NA 133* 133*  K 3.8 3.7  CL 101 103  CO2 26 24  GLUCOSE 107* 97  BUN 11 7  CREATININE 0.82 0.72  CALCIUM 8.2* 8.0*   PT/INR No results for input(s): LABPROT, INR in the last 72 hours.   Recent Labs Lab 02/13/15 1217 02/14/15 0512  AST 22 38  ALT 13* 13*  ALKPHOS 86 73  BILITOT 0.8 0.3  PROT 7.1 5.8*  ALBUMIN 3.1* 2.5*     Lipase     Component Value Date/Time   LIPASE 18* 02/13/2015 1217     Studies/Results: No results found.  Medications: . ciprofloxacin  400 mg Intravenous Q12H  . cycloSPORINE  1 drop Both Eyes BID  . docusate sodium  200 mg Oral QHS  . metronidazole  500 mg Intravenous Q8H  . montelukast  10 mg Oral QHS  . saccharomyces boulardii  250 mg Oral BID    Assessment/Plan Sigmoid colon mass with progressive pain and partial obstruction Possible liver metastasis  Weight loss Severe malnutrition  - prealbumin 5.9 Spinal stenosis Hypertension Hx of basal  and squamous cell Skin cancers Arthritis with some hand changes. Antibiotics: day 5, currently on Cipro and Flagyl DVT:  I would add Lovenox but will defer to Medicine, currently she has SCD's ordered.  Plan:  Dr. Hassell Done will see and review, I am going to encourage her to use the Breeze supplement, dietician to see and start some Miralax to help clean out the colon.  She would need medical clearance for surgery.  Dr. Cristina Gong just called and preliminary diagnosis is a poorly differentiated Adenocarcinoma growing into the colon and nor a primary colon cancer.   He does not think she needs a full colonoscopy, or further GI work up.  Stains and final path are fine.    LOS: 3 days    Katherine Cooke 02/16/2015

## 2015-02-16 NOTE — Progress Notes (Signed)
As noted in my earlier note today, this patient's biopsies came back for a poorly differentiated adenocarcinoma which might be arising from outside the colon wall. Further stains are pending at this time.  These findings were discussed with the patient's attending hospitalist physician, and also with her surgeon, Dr. Kaylyn Lim.  I spoke with the patient and her son and daughter [name] and Manuela Schwartz) at the bedside today and went over the biopsy results and answered a number of questions. It does sound as though surgery is anticipated; we are, in the meantime, also awaiting oncology input.  I will obtain tumor antigens in the meantime, given the nonspecific character and atypical presentation of this lesion.  In the meantime, the patient is comfortable. Her white count is improving day by day, and her abdomen at this time is essentially nontender, suggesting a salutory effect of the antibiotics she is receiving, which in turn suggests the possibility that there was an element of underlying infection possibly complicating a necrotic tumor.  I will plan to sign off at this time; please call me if I can be of further assistance in this patient's care.  Time spent discussing the case with the pathologist, her hospitalist, Dr. Hassell Done, the surgical physician assistant, and the patient herself and her family, totaled approximately 30 minutes.  Cleotis Nipper, M.D. Pager [phone] If no answer or after 5 PM call [phone]

## 2015-02-16 NOTE — Progress Notes (Addendum)
[facility] Progress Note   Katherine Cooke  OVF:[id number]  DOB: [birth year]  DOA: 02/13/2015 PCP: Idamae Schuller, MD  Brief narrative: Katherine Cooke is a 81 y.o. female the history of hypertension, spinal stenosis who presents with lower abdominal discomfort. She has been having on and off cramping worse for the past week. She has had a mass in the left lower abdomen and troubles with constipation and occasional discomfort in the left lower quadrant for many years has never mentioned it to her PCP.  She had a small amount of blood in her stool in the morning of admission. She states that symptoms have been much worse over the past week. Imaging in the ER reveals a sigmoid mass which is either a malignancy or possible abscess. She was also found to have leukocytosis. She was admitted for further admitted with IV antibiotics and workup.   Subjective: Continued dull pain in abdomen. Having itching this morning. No nausea vomiting or diarrhea. No cough or shortness of breath.  Assessment/Plan: Principal Problem:   Colonic mass - Sigmoid mass suggestive of malignancy versus abscess with leukocytosis - 9/12- sigmoidoscopy performed- mass noted and biopsies done-biopsies revealed poorly differentiated adenocarcinoma- I have discussed this with the patient and have consulted oncology  -cont ciprofloxacin and Flagyl - she did have leukocytosis with WBC of 16 on admission and there may be necrosis and possible infection within the mass-wbc's steadily improved  to near normal today -Continue clear liquid diet for now -Surgery is following   Active Problems:   Hypertension - Hypotensive intermittently -Holding losartan and HCTZ   Acute Renal failure/hyponatremia/elevated BUN/creatinine ratio -Prerenal acute renal failure -Creatinine of 1.15 on admission improved to 0.82 with hydration -Low sodium level and BUN/creatinine ratio consistent with dehydration -We will continue slow IV  fluids  - hold HCTZ    Anemia -Anemia panel reveals anemia of chronic disease   Code Status:     Code Status Orders        Start     Ordered   02/13/15 2221  Full code   Continuous     02/13/15 2220    Advance Directive Documentation        Most Recent Value   Type of Advance Directive  Living will, Healthcare Power of Attorney   Pre-existing out of facility DNR order (yellow form or pink MOST form)     "MOST" Form in Place?       Family Communication: Son and daughter today Disposition Plan:  home when stable DVT prophylaxis: SCDs Consultants: GI consult Procedures: sigmoidoscopy Appt with PCP: Requested Antibiotics: Anti-infectives    Start     Dose/Rate Route Frequency Ordered Stop   02/15/15 1800  ciprofloxacin (CIPRO) IVPB 400 mg     400 mg 200 mL/hr over 60 Minutes Intravenous Every 12 hours 02/15/15 1629     02/15/15 1700  metroNIDAZOLE (FLAGYL) IVPB 500 mg     500 mg 100 mL/hr over 60 Minutes Intravenous Every 8 hours 02/15/15 1629     02/15/15 1200  amoxicillin-clavulanate (AUGMENTIN) 875-125 MG per tablet 1 tablet  Status:  Discontinued     1 tablet Oral Every 12 hours 02/15/15 1058 02/15/15 1629   02/14/15 2200  piperacillin-tazobactam (ZOSYN) IVPB 3.375 g  Status:  Discontinued     3.375 g 12.5 mL/hr over 240 Minutes Intravenous 3 times per day 02/14/15 2051 02/15/15 1105   02/13/15 1900  metroNIDAZOLE (FLAGYL) tablet 500 mg     500 mg Oral  Once 02/13/15 1847 02/13/15 1904   02/13/15 1900  ciprofloxacin (CIPRO) tablet 500 mg     500 mg Oral  Once 02/13/15 1847 02/13/15 1904      Objective: Filed Weights   02/13/15 2116 02/15/15 1448  Weight: 48.535 kg (107 lb) 46.72 kg (103 lb)    Intake/Output Summary (Last 24 hours) at 02/16/15 1517 Last data filed at 02/16/15 0512  Gross per 24 hour  Intake   1635 ml  Output      0 ml  Net   1635 ml     Vitals Filed Vitals:   02/15/15 1630 02/15/15 2224 02/16/15 0641 02/16/15 1349  BP: 119/46 120/54  109/45 133/51  Pulse:  88 88 92  Temp:  98.4 F (36.9 C) 98.6 F (37 C) 98.2 F (36.8 C)  TempSrc:  Oral Oral Oral  Resp: '16 18 18 20  '$ Height:      Weight:      SpO2: 100% 98% 95% 98%    Exam:  General:  Pt is alert, not in acute distress  HEENT: No icterus, No thrush, oral mucosa moist  Cardiovascular: regular rate and rhythm, S1/S2 No murmur  Respiratory: clear to auscultation bilaterally   Abdomen: Soft, +Bowel sounds, tender in left lower quadrant and lower mid abdomen with palpable mass, non distended, no guarding  MSK: No LE edema, cyanosis or clubbing  Data Reviewed: Basic Metabolic Panel:  Recent Labs Lab 02/13/15 1217 02/14/15 0512 02/15/15 0404 02/16/15 0421  NA 131* 132* 133* 133*  K 4.1 3.6 3.8 3.7  CL 96* 100* 101 103  CO2 '26 26 26 24  '$ GLUCOSE 105* 124* 107* 97  BUN '19 20 11 7  '$ CREATININE 1.15* 0.95 0.82 0.72  CALCIUM 9.0 8.1* 8.2* 8.0*  MG  --  2.1  --   --   PHOS  --  3.6  --   --    Liver Function Tests:  Recent Labs Lab 02/13/15 1217 02/14/15 0512  AST 22 38  ALT 13* 13*  ALKPHOS 86 73  BILITOT 0.8 0.3  PROT 7.1 5.8*  ALBUMIN 3.1* 2.5*    Recent Labs Lab 02/13/15 1217  LIPASE 18*   No results for input(s): AMMONIA in the last 168 hours. CBC:  Recent Labs Lab 02/13/15 1217 02/14/15 0512 02/15/15 0404 02/16/15 0421  WBC 16.3* 13.1* 12.0* 10.6*  HGB 11.3* 10.1* 9.4* 9.4*  HCT 33.2* 29.9* 28.2* 27.9*  MCV 98.2 98.0 98.6 98.2  PLT 472* 457* 422* 397   Cardiac Enzymes: No results for input(s): CKTOTAL, CKMB, CKMBINDEX, TROPONINI in the last 168 hours. BNP (last 3 results) No results for input(s): BNP in the last 8760 hours.  ProBNP (last 3 results) No results for input(s): PROBNP in the last 8760 hours.  CBG: No results for input(s): GLUCAP in the last 168 hours.  No results found for this or any previous visit (from the past 240 hour(s)).   Studies: No results found.  Scheduled Meds:  Scheduled Meds: .  ciprofloxacin  400 mg Intravenous Q12H  . cycloSPORINE  1 drop Both Eyes BID  . docusate sodium  200 mg Oral QHS  . feeding supplement  1 Container Oral TID BM  . metronidazole  500 mg Intravenous Q8H  . montelukast  10 mg Oral QHS  . polyethylene glycol  17 g Oral Daily  . saccharomyces boulardii  250 mg Oral BID   Continuous Infusions: . sodium chloride 75 mL/hr at 02/16/15 1223  Time spent on care of this patient: 7 min   Metzger, MD 02/16/2015, 3:17 PM  LOS: 3 days   [facility] Office  [phone] Pager - Text Page per [email or web address] If 7PM-7AM, please contact night-coverage [email or web address]

## 2015-02-17 ENCOUNTER — Inpatient Hospital Stay (HOSPITAL_COMMUNITY): Payer: Medicare Other

## 2015-02-17 ENCOUNTER — Ambulatory Visit (HOSPITAL_COMMUNITY): Payer: Medicare Other

## 2015-02-17 ENCOUNTER — Encounter (HOSPITAL_COMMUNITY): Payer: Self-pay | Admitting: Gastroenterology

## 2015-02-17 DIAGNOSIS — C19 Malignant neoplasm of rectosigmoid junction: Secondary | ICD-10-CM

## 2015-02-17 DIAGNOSIS — I639 Cerebral infarction, unspecified: Secondary | ICD-10-CM | POA: Diagnosis present

## 2015-02-17 LAB — CBC
HEMATOCRIT: 28.2 % — AB (ref 36.0–46.0)
HEMOGLOBIN: 9.4 g/dL — AB (ref 12.0–15.0)
MCH: 32.8 pg (ref 26.0–34.0)
MCHC: 33.3 g/dL (ref 30.0–36.0)
MCV: 98.3 fL (ref 78.0–100.0)
Platelets: 404 10*3/uL — ABNORMAL HIGH (ref 150–400)
RBC: 2.87 MIL/uL — AB (ref 3.87–5.11)
RDW: 13 % (ref 11.5–15.5)
WBC: 12.2 10*3/uL — ABNORMAL HIGH (ref 4.0–10.5)

## 2015-02-17 LAB — BASIC METABOLIC PANEL
Anion gap: 7 (ref 5–15)
BUN: <5 mg/dL — ABNORMAL LOW (ref 6–20)
CHLORIDE: 106 mmol/L (ref 101–111)
CO2: 23 mmol/L (ref 22–32)
CREATININE: 0.6 mg/dL (ref 0.44–1.00)
Calcium: 8 mg/dL — ABNORMAL LOW (ref 8.9–10.3)
GFR calc non Af Amer: >60 mL/min (ref >60)
Glucose, Bld: 96 mg/dL (ref 65–99)
POTASSIUM: 3.5 mmol/L (ref 3.5–5.1)
Sodium: 136 mmol/L (ref 135–145)

## 2015-02-17 MED ORDER — ASPIRIN 325 MG PO TABS
325.0000 mg | ORAL_TABLET | Freq: Every day | ORAL | Status: DC
  Administered 2015-02-19 – 2015-02-22 (×4): 325 mg via ORAL
  Filled 2015-02-17 (×6): qty 1

## 2015-02-17 MED ORDER — STROKE: EARLY STAGES OF RECOVERY BOOK
Freq: Once | Status: AC
  Administered 2015-02-17: 15:00:00
  Filled 2015-02-17: qty 1

## 2015-02-17 MED ORDER — IOHEXOL 350 MG/ML SOLN
50.0000 mL | Freq: Once | INTRAVENOUS | Status: AC | PRN
  Administered 2015-02-17: 50 mL via INTRAVENOUS

## 2015-02-17 MED ORDER — ENOXAPARIN SODIUM 40 MG/0.4ML ~~LOC~~ SOLN
40.0000 mg | SUBCUTANEOUS | Status: DC
  Administered 2015-02-17: 40 mg via SUBCUTANEOUS
  Filled 2015-02-17: qty 0.4

## 2015-02-17 NOTE — Evaluation (Signed)
Clinical/Bedside Swallow Evaluation Patient Details  Name: Katherine Cooke MRN: [id number] Date of Birth: 1925/08/23  Today's Date: 02/17/2015 Time: SLP Start Time (ACUTE ONLY): 9326 SLP Stop Time (ACUTE ONLY): 1431 SLP Time Calculation (min) (ACUTE ONLY): 26 min  Past Medical History:  Past Medical History  Diagnosis Date  . Hypertension   . Spinal stenosis   . Basal cell carcinoma of nose 2005  . Squamous cell carcinoma of left hip 2005  . Colon cancer dx'd 02/16/15   Past Surgical History:  Past Surgical History  Procedure Laterality Date  . Breast biopsy  1950's ; 1952 ; 1954    x 3 - Benign  . Tonsillectomy    . Flexible sigmoidoscopy N/A 02/15/2015    Procedure: FLEXIBLE SIGMOIDOSCOPY;  Surgeon: Ronald Lobo, MD;  Location: [facility];  Service: Endoscopy;  Laterality: N/A;   HPI:  81 yo female adm to [facility] with colonic mass.  Pt found to have expressive aphasia, facial droop, vision changes, speech changes with concern for possible CVA.  Pt cva confirmed - per MRI -.  Multifocal areas of acute infarction affecting the cerebellum, LEFT paramedian pons, LEFT thalamus, and LEFT posterior parietal and occipital lobe.  Most recent CXR negative.  Swallow evaluation ordered.    Assessment / Plan / Recommendation Clinical Impression  Pt presents with clear expressive language deficits, right facial asymmetry, weak cough and decreased palatal elevation with phonation *present with gag.  She reports facial sensation to be same bilaterally - note left eye not opening well - ? premorbid issue.  SLP administered only ice and clear liquids due to premorbid diet being clears due to colon issues.  Delayed oral transiting noted across all consistencies with pt reporting sensation of "strangling" with ice chip - suspect premature loss from oral cavity into pharynx.  No overt indications of aspiration with small cup boluses of clear, only mild delay in swallow present.  No indications of pharyngeal  residuals.    Recommend continue clears with strict precautions.  Using teach back, educated famly to recommendation not to use straws and reviewed strict aspiration precautions.  Family and pt report understanding and teach back, providing written information completed.  Will follow for speech and swallow ability to maximize pt's function and decrease caregiver burden.     Aspiration Risk    Mild   Diet Recommendation  (clears, thin)   Medication Administration: Whole meds with puree Compensations: Slow rate;Small sips/bites;Check for pocketing    Other  Recommendations Oral Care Recommendations: Oral care BID   Follow Up Recommendations    TBD   Frequency and Duration min 2x/week  1 week   Pertinent Vitals/Pain Afebrile, decreased      Swallow Study Prior Functional Status   see hhx    General Date of Onset: 02/17/15 Other Pertinent Information: 81 yo female adm to [facility] with colonic mass.  Pt found to have expressive aphasia, facial droop, vision changes, speech changes with concern for possible CVA.  Pt cva confirmed - per MRI -.  Multifocal areas of acute infarction affecting the cerebellum, LEFT paramedian pons, LEFT thalamus, and LEFT posterior parietal and occipital lobe.  Most recent CXR negative.  Swallow evaluation ordered.  Type of Study: Bedside swallow evaluation Diet Prior to this Study: NPO (NPO) Temperature Spikes Noted: No Respiratory Status: Room air History of Recent Intubation: No Behavior/Cognition: Alert;Cooperative Oral Cavity - Dentition: Adequate natural dentition/normal for age Self-Feeding Abilities: Needs assist Patient Positioning: Upright in bed Baseline Vocal Quality: Low  vocal intensity Volitional Cough: Weak Volitional Swallow: Able to elicit (after oral moisture provided due to xerostomia)    Oral/Motor/Sensory Function Overall Oral Motor/Sensory Function: Impaired Labial ROM: Reduced right Labial Symmetry: Abnormal symmetry right Labial  Strength: Reduced Lingual ROM: Reduced right Lingual Symmetry: Within Functional Limits Lingual Strength: Reduced Facial Symmetry: Right droop;Left drooping eyelid (left eye droop baseline but not sure to extent) Facial Strength: Reduced Facial Sensation: Within Functional Limits Velum:  (no palatal elevation with phonation, gag present) Mandible:  (dnt)   Ice Chips Ice chips: Impaired Presentation: Spoon;Self Fed Oral Phase Impairments: Reduced lingual movement/coordination Oral Phase Functional Implications: Prolonged oral transit Pharyngeal Phase Impairments: Suspected delayed Swallow;Cough - Immediate Other Comments: pt reported sensation of "strangling" x1-suspect premature spillage of ice into pharynx due to poor lingual control   Thin Liquid Thin Liquid: Impaired Presentation: Cup;Spoon Oral Phase Impairments: Reduced lingual movement/coordination Oral Phase Functional Implications: Prolonged oral transit (slight delay in oral transiting)    Nectar Thick Nectar Thick Liquid: Not tested   Honey Thick Honey Thick Liquid: Not tested   Puree Puree: Not tested Other Comments: pt was on clears due to bowel issues   Solid   GO    Solid: Not tested Other Comments: pt was on clears due to bowel issues       [name], [facility] SLP [phone]

## 2015-02-17 NOTE — Progress Notes (Signed)
Patient last night was notably more confused with somewhat slurred speech and difficulty finding words.  Patient also reported some blurred vision as well.  Son was notified, whom after speaking with his mother on the phone, reported this confusion as extremely abnormal.  [facility] Forrest Moron was notified.  She came up quickly to assess patient and order CT scan (which was negative).  Family came up to see patient as well.  Patient this am continues to be confused.  She is still slurring her words and appears to have disorganized speech.  Vitals remain WNL and CBG WNL. Will continue to monitor patient closely.Azzie Glatter [place]

## 2015-02-17 NOTE — Progress Notes (Signed)
2 Days Post-Op  Subjective: Mental status changes, being evaluated for CVA.  We were planning surgery tomorrow if the prep is adequate.  At this point we will need to wait and see how she does from a medical standpoint before going forward.    Objective: Vital signs in last 24 hours: Temp:  [98.2 F (36.8 C)-98.8 F (37.1 C)] 98.8 F (37.1 C) (09/14 0511) Pulse Rate:  [89-92] 89 (09/14 0511) Resp:  [20] 20 (09/14 0511) BP: (131-142)/(51-61) 131/61 mmHg (09/14 0511) SpO2:  [95 %-98 %] 96 % (09/14 0511) Last BM Date: 02/17/15 240 PO recorded 1 stool, 400 urine recorded. Afebrile, VSS Labs OK this AM  WBC up some. CXR is pending Mental status changes last night being evaluated for CVA.   MRI brain this AM:  Multifocal areas of acute infarction affecting the cerebellum, LEFT paramedian pons, LEFT thalamus, and LEFT posterior parietal and occipital regions. Posterior circulation event is suspected.  Intake/Output from previous day: 09/13 0701 - 09/14 0700 In: 3182.5 [P.O.:240; I.V.:1842.5; IV Piggyback:1100] Out: 400 [Urine:400] Intake/Output this shift: Total I/O In: 256.3 [P.O.:120; I.V.:36.3; IV Piggyback:100] Out: -   General appearance: pt sleeping after MRI, I did not wake her she, has been up all evening.  Family at bedside, they report she is still confused and frustrated with having to look for words.  Lab Results:   Recent Labs  02/16/15 0421 02/17/15 0413  WBC 10.6* 12.2*  HGB 9.4* 9.4*  HCT 27.9* 28.2*  PLT 397 404*    BMET  Recent Labs  02/16/15 0421 02/17/15 0413  NA 133* 136  K 3.7 3.5  CL 103 106  CO2 24 23  GLUCOSE 97 96  BUN 7 <5*  CREATININE 0.72 0.60  CALCIUM 8.0* 8.0*   PT/INR No results for input(s): LABPROT, INR in the last 72 hours.   Recent Labs Lab 02/13/15 1217 02/14/15 0512  AST 22 38  ALT 13* 13*  ALKPHOS 86 73  BILITOT 0.8 0.3  PROT 7.1 5.8*  ALBUMIN 3.1* 2.5*     Lipase     Component Value Date/Time   LIPASE 18*  02/13/2015 1217     Studies/Results: Ct Head Wo Contrast  02/17/2015   CLINICAL DATA:  New onset confusion tonight with right facial droop and blurred vision right eye. Recent diagnosis of colon cancer.  EXAM: CT HEAD WITHOUT CONTRAST  TECHNIQUE: Contiguous axial images were obtained from the base of the skull through the vertex without intravenous contrast.  COMPARISON:  02/23/2005  FINDINGS: Ventricles, cisterns and other CSF spaces are within normal. Mild bilateral basal ganglia calcifications are present. There is no mass, mass effect, shift of midline structures or acute hemorrhage. There is no evidence to suggest acute infarction. Remaining bones and soft tissues are within normal.  IMPRESSION: No acute intracranial findings.   Electronically Signed   By: Marin Olp M.D.   On: 02/17/2015 00:00    Medications: . ciprofloxacin  400 mg Intravenous Q12H  . cycloSPORINE  1 drop Both Eyes BID  . docusate sodium  200 mg Oral QHS  . feeding supplement  1 Container Oral TID BM  . metronidazole  500 mg Intravenous Q8H  . montelukast  10 mg Oral QHS  . saccharomyces boulardii  250 mg Oral BID    Assessment/Plan Sigmoid colon mass with progressive pain and partial obstruction Possible liver metastasis  Weight loss Severe malnutrition - prealbumin 5.9 New mental status changes with Multifocal acute infarction affecting the  cerebellum, LEFT paramedian pons, LEFT thalamus, and LEFT posterior parietal and occipital regions. Spinal stenosis Hypertension Hx of basal and squamous cell Skin cancers Arthritis with some hand changes. Antibiotics: day 5, currently on Cipro and Flagyl DVT: I would add Lovenox but will defer to Medicine, currently she has SCD's ordered.  Plan:  New CVA, await work up and follow with you.   LOS: 4 days    Jaevian Shean 02/17/2015

## 2015-02-17 NOTE — Progress Notes (Signed)
(  no charge)  I received an update on the patient's pathology report from Dr. Vicente Males.   She is quite sure that this is not colon cancer. The stains actually suggest lung or thyroid origin (patient had a clear 1 view chest x-ray this morning).  Further stains will be out tomorrow.  Cleotis Nipper, M.D. Pager [phone] If no answer or after 5 PM call [phone]

## 2015-02-17 NOTE — Progress Notes (Signed)
Rehab Admissions Coordinator Note:  Patient was screened by Cleatrice Burke for appropriateness for an Inpatient Acute Rehab Consult per PT recommendation.  At this time, we are recommending Inpatient Rehab consult. Noted MRI results.   Cleatrice Burke 02/17/2015, 2:31 PM  I can be reached at [phone].

## 2015-02-17 NOTE — Progress Notes (Signed)
Pt arrived to 5M08 '@1550'$ , Pt A&Ox 1-2, c/o pain 0/10. Dressing CDI, no drains. Pt VS taken, pt on RA. Pt without distress. Family at the bedside. Diet ordered, will monitor.

## 2015-02-17 NOTE — Progress Notes (Signed)
Patient being transferred to [facility] Neuro ICU. Report called to [facility] Course and Care link was called to transport patient. Family was notified of transport and room number at [facility]. Philis Fendt, RN 02/17/2015 1438

## 2015-02-17 NOTE — Progress Notes (Addendum)
[facility] PROGRESS NOTE  Katherine Cooke OHY:[id number] DOB: [birth year] DOA: 02/13/2015 PCP: Idamae Schuller, MD  Assessment/Plan: 1. Acute CVA. -Patient initially admitted for abdominal complaints, having CT scan of abdomen and pelvis that revealed sigmoid mass. After undergoing sigmoidoscopy with biopsy, found to have adenocarcinoma. -Overnight she developed mental status changes. CT scan of brain did not show acute intracranial abnormalities however further workup with MRI of brain showed multifocal areas of acute infarct. -Will place patient on the stroke protocol, transfer to [facility].  -Will obtain transthoracic echocardiogram, carotid Dopplers, MRA, bilateral lower extremity venous Dopplers.  -Case was discussed with neurology who recommended initiating aspirin 325 mg by mouth daily -I spoke with Dr Aram Beecham of Neurology  2.  Adenocarcinoma  -Patient diagnosed with adenocarcinoma affecting sigmoid during this hospitalization, undergoing a sigmoidoscopy on 02/15/2015 with biopsies of mass. Unfortunately this revealed poorly differentiated adenocarcinoma. -It is possible underlying malignancy led to a hypercoagulable state, ultimately leading to stroke of thromboembolic phenomenon. -She remains on ciprofloxacin and Flagyl given concerns for necrosis and infection -Given acute CVA surgery will need to be postponed  3.  Hyponatremia. -Likely secondary to prerenal azotemia -Sodium improved from 133-136  4.  Hypertension. -Blood pressures are stable  Code Status: Full code Family Communication: I spoke to her son, daughter, and granddaughter at bedside Disposition Plan: Will transfer to [facility] for further evaluation and treatment   Consultants:  Neurology   Antibiotics:  Ciprofloxacin  Flagyl  HPI/Subjective: Patient is is an 81 year old female with a past medical history of hypertension, spinal stenosis, admitted to the medicine service on  02/14/2015 presenting with complaints of lower abdominal pain. She reported having a small amount of blood morning of admission. Workup in the emergency department included a CT scan of abdomen and pelvis that showed masslike soft tissue involving the sigmoid colon that was concerning for malignancy. GI was consulted. On 02/15/2015 she underwent sigmoidoscopy with biopsy. Pathology reported adenocarcinoma. General surgery was consulted regarding possibility of surgical excision of mass. On the evening of 02/16/2015 she had mental status changes. On my evaluation on 02/17/2015 she appear to have right-sided facial droop with associated right-sided weakness. A stat MRI of the brain was ordered which unfortunately revealed multifocal areas of acute infarction affecting cerebellum, left pons, left thalamus and left posterior parietal and occipital regions. Findings appearing to be consistent with thromboembolic phenomenon. I discussed case with Dr. Armida Sans of neurology who recommended starting aspirin 325 mg by mouth every daily and transferring patient to [facility] for further evaluation and treatment.   Objective: Filed Vitals:   02/17/15 1341  BP: 124/48  Pulse: 89  Temp: 98 F (36.7 C)  Resp: 16    Intake/Output Summary (Last 24 hours) at 02/17/15 1405 Last data filed at 02/17/15 1000  Gross per 24 hour  Intake 3438.75 ml  Output    400 ml  Net 3038.75 ml   Filed Weights   02/13/15 2116 02/15/15 1448  Weight: 48.535 kg (107 lb) 46.72 kg (103 lb)    Exam:   General:  Ill-appearing, patient having a right-sided facial droop with slurred speech. She is awake and alert and can follow some commands  Cardiovascular: Regular rate and rhythm normal S1-S2 no murmurs rubs or gallops  Respiratory: Normal respiratory for, lungs are clear to auscultation bilaterally no wheezing rhonchi or rales  Abdomen: Patient having mild tenderness to palpation over lower, region  Musculoskeletal:  No edema  Neurological: She has right-sided facial  droop with disconjugate rightward gaze, no tongue deviation, she had 4-5 muscle strength to her right upper extremity and 4 out of 5 muscle strength to her right lower extremity. Deep tendon reflexes were intact, there was no alteration to sensation to extremities.  Data Reviewed: Basic Metabolic Panel:  Recent Labs Lab 02/13/15 1217 02/14/15 0512 02/15/15 0404 02/16/15 0421 02/17/15 0413  NA 131* 132* 133* 133* 136  K 4.1 3.6 3.8 3.7 3.5  CL 96* 100* 101 103 106  CO2 '26 26 26 24 23  '$ GLUCOSE 105* 124* 107* 97 96  BUN '19 20 11 7 '$ <5*  CREATININE 1.15* 0.95 0.82 0.72 0.60  CALCIUM 9.0 8.1* 8.2* 8.0* 8.0*  MG  --  2.1  --   --   --   PHOS  --  3.6  --   --   --    Liver Function Tests:  Recent Labs Lab 02/13/15 1217 02/14/15 0512  AST 22 38  ALT 13* 13*  ALKPHOS 86 73  BILITOT 0.8 0.3  PROT 7.1 5.8*  ALBUMIN 3.1* 2.5*    Recent Labs Lab 02/13/15 1217  LIPASE 18*   No results for input(s): AMMONIA in the last 168 hours. CBC:  Recent Labs Lab 02/13/15 1217 02/14/15 0512 02/15/15 0404 02/16/15 0421 02/17/15 0413  WBC 16.3* 13.1* 12.0* 10.6* 12.2*  HGB 11.3* 10.1* 9.4* 9.4* 9.4*  HCT 33.2* 29.9* 28.2* 27.9* 28.2*  MCV 98.2 98.0 98.6 98.2 98.3  PLT 472* 457* 422* 397 404*   Cardiac Enzymes: No results for input(s): CKTOTAL, CKMB, CKMBINDEX, TROPONINI in the last 168 hours. BNP (last 3 results) No results for input(s): BNP in the last 8760 hours.  ProBNP (last 3 results) No results for input(s): PROBNP in the last 8760 hours.  CBG:  Recent Labs Lab 02/16/15 2243  GLUCAP 98    No results found for this or any previous visit (from the past 240 hour(s)).   Studies: Dg Chest 1 View  02/17/2015   CLINICAL DATA:  Delirium and weakness. 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 History of colon carcinoma.  EXAM: CHEST  1 VIEW   COMPARISON:  12/28/2010  FINDINGS: Cardiac silhouette is mildly enlarged. No mediastinal or hilar masses or evidence of adenopathy.  Lungs are clear.  No pleural effusion or pneumothorax.  Bony thorax is demineralized but grossly intact.  IMPRESSION: No active disease.   Electronically Signed   By: Lajean Manes M.D.   On: 02/17/2015 10:41   Ct Head Wo Contrast  02/17/2015   CLINICAL DATA:  New onset confusion tonight with right facial droop and blurred vision right eye. Recent diagnosis of colon cancer.  EXAM: CT HEAD WITHOUT CONTRAST  TECHNIQUE: Contiguous axial images were obtained from the base of the skull through the vertex without intravenous contrast.  COMPARISON:  02/23/2005  FINDINGS: Ventricles, cisterns and other CSF spaces are within normal. Mild bilateral basal ganglia calcifications are present. There is no mass, mass effect, shift of midline structures or acute hemorrhage. There is no evidence to suggest acute infarction. Remaining bones and soft tissues are within normal.  IMPRESSION: No acute intracranial findings.   Electronically Signed   By: Marin Olp M.D.   On: 02/17/2015 00:00   Mr Brain Wo Contrast  02/17/2015   CLINICAL DATA:  Confusion with slurred speech and difficulty with word finding. Blurred vision. Symptoms began 02/16/2015.  EXAM: MRI HEAD WITHOUT  CONTRAST  TECHNIQUE: Multiplanar, multiecho pulse sequences of the brain and surrounding structures were obtained without intravenous contrast.  COMPARISON:  CT head 02/16/2015.  FINDINGS: Multifocal areas of restricted diffusion representing acute infarction are observed. These are located in multiple vascular territories. Subcentimeter BILATERAL cerebellar cortical and subcortical infarcts are seen. Linear LEFT paramedian acute pontine infarct. Scattered medial LEFT parietal and lateral LEFT occipital cortical and subcortical acute infarcts. Largest area of infarction affects the LEFT thalamus, 10 x 14 mm. All of these acute  infarcts lie within the distribution of the BILATERAL vertebral arteries and basilar artery, and felt to represent posterior circulation event.  No acute or chronic hemorrhage, mass lesion, hydrocephalus, or extra-axial fluid.  Generalized atrophy. Mild subcortical and periventricular T2 and FLAIR hyperintensities, likely chronic microvascular ischemic change. Flow voids are maintained in the carotid, basilar, and both distal vertebral arteries.  BILATERAL cataract extraction. Chronic sinus disease. No mastoid fluid. No osseous findings. No midline abnormality. Compared with prior CT, the infarcts are not visible on that study.  IMPRESSION: Multifocal areas of acute infarction affecting the cerebellum, LEFT paramedian pons, LEFT thalamus, and LEFT posterior parietal and occipital regions. Posterior circulation event is suspected.  Generalized atrophy and mild small vessel disease, not unexpected for age.  These results will be called to the ordering clinician or representative by the Radiologist Assistant, and communication documented in the PACS or zVision Dashboard.   Electronically Signed   By: Staci Righter M.D.   On: 02/17/2015 11:01    Scheduled Meds: .  stroke: mapping our early stages of recovery book   Does not apply Once  . aspirin  325 mg Oral Daily  . ciprofloxacin  400 mg Intravenous Q12H  . cycloSPORINE  1 drop Both Eyes BID  . docusate sodium  200 mg Oral QHS  . enoxaparin (LOVENOX) injection  40 mg Subcutaneous Q24H  . feeding supplement  1 Container Oral TID BM  . metronidazole  500 mg Intravenous Q8H  . montelukast  10 mg Oral QHS  . saccharomyces boulardii  250 mg Oral BID   Continuous Infusions: . sodium chloride 75 mL/hr at 02/17/15 1121    Principal Problem:   Colonic mass Active Problems:   Hypertension   Osteopenia   Leukocytosis   Anemia of chronic disease   CVA (cerebral infarction)    Time spent: 81 min    Kelvin Cellar  [facility] Pager  [phone]. If 7PM-7AM, please contact night-coverage at [email or web address], password [facility] 02/17/2015, 2:05 PM  LOS: 4 days

## 2015-02-17 NOTE — Consult Note (Addendum)
Referring Physician: Dr Coralyn Pear    Chief Complaint: confusion, right face-right arm-leg weakness, stroke on MRI  HPI:                                                                                                                                         Katherine Cooke is an 81 y.o. female with a past medical history significant for HTN, basal cell carcinoma, spinal stenosis, admitted to [facility] for evaluation of left lower quadrant abdominal pain of one week duration with preliminary biopsy results concerning for adenocarcinoma, who overnight developed mental status changes and had CT scan of brain that did not show acute intracranial abnormalities. During earlier evaluation today was noted to have right sided weakness, slurred speech, as well as right face droopiness that prompted MRI brain which I personally reviewed and showed acute areas of ischemic infarctions involving bilateral cerebellar hemispheres, left paramedian pons, as well as scattered medial LEFT parietal and lateral LEFT occipital cortical and subcortical acute infarcts. The largest area of infarction affects the LEFT thalamus. Patient family is at the bedside and expressed their concern and unhappiness with the manner who her acute symptoms were handled last night. Her son said that he noticed that patient had slurred speech and right face droopiness last night but they have to insist to get the CT brain done, and afterwards they were assure patient did not have a stroke. He said that he is getting discordant information from the physicians regarding what had occurred since last night, including why patient was not administer tpa. I expressed to the family that this is my first encounter with the patient and I was not aware of such developmts last night. In any case, patient said that she is uncomfortable due to abdominal pain, but denies HA, vertigo, double vision, difficulty swallowing, or visual loss.   Date last known well: 02/16/15 Time  last known well: 5 pm tPA Given: no, code stroke was not activated when patient had change in neuro status, initial symptoms thought to be medication related, patient assessment by primary team today with more definitve focal findings.   Past Medical History  Diagnosis Date  . Hypertension   . Spinal stenosis   . Basal cell carcinoma of nose 2005  . Squamous cell carcinoma of left hip 2005  . Colon cancer dx'd 02/16/15    Past Surgical History  Procedure Laterality Date  . Breast biopsy  1950's ; 1952 ; 1954    x 3 - Benign  . Tonsillectomy    . Flexible sigmoidoscopy N/A 02/15/2015    Procedure: FLEXIBLE SIGMOIDOSCOPY;  Surgeon: Ronald Lobo, MD;  Location: [facility];  Service: Endoscopy;  Laterality: N/A;    Family History  Problem Relation Age of Onset  . Breast cancer Maternal Aunt   . Ovarian cancer Sister   . Heart disease Father   . Ulcers Father   .  COPD Mother    Social History:  reports that she has never smoked. She has never used smokeless tobacco. She reports that she drinks about 4.2 oz of alcohol per week. She reports that she does not use illicit drugs. Family history: no MS, epilepsy, brain tumor, or brain aneurysm. Allergies:  Allergies  Allergen Reactions  . Lyrica [Pregabalin] Shortness Of Breath    Medications:                                                                                                                           Scheduled: . aspirin  325 mg Oral Daily  . ciprofloxacin  400 mg Intravenous Q12H  . cycloSPORINE  1 drop Both Eyes BID  . docusate sodium  200 mg Oral QHS  . enoxaparin (LOVENOX) injection  40 mg Subcutaneous Q24H  . feeding supplement  1 Container Oral TID BM  . metronidazole  500 mg Intravenous Q8H  . montelukast  10 mg Oral QHS  . saccharomyces boulardii  250 mg Oral BID    ROS:                                                                                                                                        History obtained from chart review and the patient  General ROS: negative for - chills, fever, night sweats, weight gain or weight loss Psychological ROS: negative for - behavioral disorder, hallucinations, memory difficulties, mood swings or suicidal ideation Ophthalmic ROS: negative for - blurry vision, double vision, eye pain or loss of vision ENT ROS: negative for - epistaxis, nasal discharge, oral lesions, sore throat, tinnitus or vertigo Allergy and Immunology ROS: negative for - hives or itchy/watery eyes Hematological and Lymphatic ROS: negative for - bleeding problems, bruising or swollen lymph nodes Endocrine ROS: negative for - galactorrhea, hair pattern changes, polydipsia/polyuria or temperature intolerance Respiratory ROS: negative for - cough, hemoptysis, shortness of breath or wheezing Cardiovascular ROS: negative for - chest pain, dyspnea on exertion, edema or irregular heartbeat Gastrointestinal ROS: negative for - diarrhea, or stool incontinence Genito-Urinary ROS: negative for - dysuria, hematuria, incontinence or urinary frequency/urgency Musculoskeletal ROS: negative for - joint swelling Neurological ROS: as noted in HPI Dermatological ROS: negative for rash and skin lesion changes   Physical exam:  Constitutional: well developed, pleasant female in mild distress due to pain. Blood pressure  124/48, pulse 89, temperature 98 F (36.7 C), temperature source Oral, resp. rate 16, height $RemoveBe'4\' 11"'dDgWpPhkb$  (1.499 m), weight 46.72 kg (103 lb), SpO2 94 %. Eyes: no jaundice or exophthalmos.  Head: normocephalic. Neck: supple, no bruits, no JVD. Cardiac: no murmurs. Lungs: clear. Abdomen: soft, no tender, no mass. Extremities: no edema, clubbing, or cyanosis.  Skin: no rash  Neurologic Examination:                                                                                                      General: Mental Status: Alert, oriented, thought content appropriate. Mild dysarthria  without evidence of aphasia.  Able to follow 3 step commands without difficulty. Cranial Nerves: II: Discs flat bilaterally; Visual fields grossly normal, pupils equal, round, reactive to light and accommodation III,IV, VI: left ptosis present, dysconjugate gaze with left medial rectus weakness. V,VII: smile asymmetric with mild right face weakness, facial light touch sensation normal bilaterally VIII: hearing normal bilaterally IX,X: uvula rises symmetrically XI: bilateral shoulder shrug XII: midline tongue extension without atrophy or fasciculations Motor: Can not appreciate frank muscle weakness Tone and bulk:normal tone throughout; no atrophy noted Sensory: Pinprick and light touch intact throughout, bilaterally Deep Tendon Reflexes:  1+ all over Plantars: Right: downgoing   Left: downgoing Cerebellar: normal finger-to-nose,  heel-to-shin no  tested Gait:  No tested due to multiple leads, safety concerns.      Results for orders placed or performed during the hospital encounter of 02/13/15 (from the past 48 hour(s))  CBC     Status: Abnormal   Collection Time: 02/16/15  4:21 AM  Result Value Ref Range   WBC 10.6 (H) 4.0 - 10.5 K/uL   RBC 2.84 (L) 3.87 - 5.11 MIL/uL   Hemoglobin 9.4 (L) 12.0 - 15.0 g/dL   HCT 27.9 (L) 36.0 - 46.0 %   MCV 98.2 78.0 - 100.0 fL   MCH 33.1 26.0 - 34.0 pg   MCHC 33.7 30.0 - 36.0 g/dL   RDW 12.8 11.5 - 15.5 %   Platelets 397 150 - 400 K/uL  Basic metabolic panel     Status: Abnormal   Collection Time: 02/16/15  4:21 AM  Result Value Ref Range   Sodium 133 (L) 135 - 145 mmol/L   Potassium 3.7 3.5 - 5.1 mmol/L   Chloride 103 101 - 111 mmol/L   CO2 24 22 - 32 mmol/L   Glucose, Bld 97 65 - 99 mg/dL   BUN 7 6 - 20 mg/dL   Creatinine, Ser 0.72 0.44 - 1.00 mg/dL   Calcium 8.0 (L) 8.9 - 10.3 mg/dL   GFR calc non Af Amer >60 >60 mL/min   GFR calc Af Amer >60 >60 mL/min    Comment: (NOTE) The eGFR has been calculated using the CKD EPI  equation. This calculation has not been validated in all clinical situations. eGFR's persistently <60 mL/min signify possible Chronic Kidney Disease.    Anion gap 6 5 - 15  Prealbumin     Status: Abnormal   Collection Time: 02/16/15  4:21 AM  Result  Value Ref Range   Prealbumin 5.9 (L) 18 - 38 mg/dL    Comment: Performed at [facility]  Glucose, capillary     Status: None   Collection Time: 02/16/15 10:43 PM  Result Value Ref Range   Glucose-Capillary 98 65 - 99 mg/dL   Comment 1 Notify RN   CBC     Status: Abnormal   Collection Time: 02/17/15  4:13 AM  Result Value Ref Range   WBC 12.2 (H) 4.0 - 10.5 K/uL   RBC 2.87 (L) 3.87 - 5.11 MIL/uL   Hemoglobin 9.4 (L) 12.0 - 15.0 g/dL   HCT 28.2 (L) 36.0 - 46.0 %   MCV 98.3 78.0 - 100.0 fL   MCH 32.8 26.0 - 34.0 pg   MCHC 33.3 30.0 - 36.0 g/dL   RDW 13.0 11.5 - 15.5 %   Platelets 404 (H) 150 - 400 K/uL  Basic metabolic panel     Status: Abnormal   Collection Time: 02/17/15  4:13 AM  Result Value Ref Range   Sodium 136 135 - 145 mmol/L   Potassium 3.5 3.5 - 5.1 mmol/L   Chloride 106 101 - 111 mmol/L   CO2 23 22 - 32 mmol/L   Glucose, Bld 96 65 - 99 mg/dL   BUN <5 (L) 6 - 20 mg/dL   Creatinine, Ser 0.60 0.44 - 1.00 mg/dL   Calcium 8.0 (L) 8.9 - 10.3 mg/dL   GFR calc non Af Amer >60 >60 mL/min   GFR calc Af Amer >60 >60 mL/min    Comment: (NOTE) The eGFR has been calculated using the CKD EPI equation. This calculation has not been validated in all clinical situations. eGFR's persistently <60 mL/min signify possible Chronic Kidney Disease.    Anion gap 7 5 - 15   Dg Chest 1 View  02/17/2015   CLINICAL DATA:  Delirium and weakness. 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 History of colon carcinoma.  EXAM: CHEST  1 VIEW  COMPARISON:  12/28/2010  FINDINGS: Cardiac silhouette is mildly enlarged. No mediastinal or hilar masses or evidence of  adenopathy.  Lungs are clear.  No pleural effusion or pneumothorax.  Bony thorax is demineralized but grossly intact.  IMPRESSION: No active disease.   Electronically Signed   By: Lajean Manes M.D.   On: 02/17/2015 10:41   Ct Head Wo Contrast  02/17/2015   CLINICAL DATA:  New onset confusion tonight with right facial droop and blurred vision right eye. Recent diagnosis of colon cancer.  EXAM: CT HEAD WITHOUT CONTRAST  TECHNIQUE: Contiguous axial images were obtained from the base of the skull through the vertex without intravenous contrast.  COMPARISON:  02/23/2005  FINDINGS: Ventricles, cisterns and other CSF spaces are within normal. Mild bilateral basal ganglia calcifications are present. There is no mass, mass effect, shift of midline structures or acute hemorrhage. There is no evidence to suggest acute infarction. Remaining bones and soft tissues are within normal.  IMPRESSION: No acute intracranial findings.   Electronically Signed   By: Marin Olp M.D.   On: 02/17/2015 00:00   Mr Brain Wo Contrast  02/17/2015   CLINICAL DATA:  Confusion with slurred speech and difficulty with word finding. Blurred vision. Symptoms began 02/16/2015.  EXAM: MRI HEAD WITHOUT CONTRAST  TECHNIQUE: Multiplanar, multiecho pulse sequences of the brain and surrounding structures were obtained without intravenous contrast.  COMPARISON:  CT head 02/16/2015.  FINDINGS:  Multifocal areas of restricted diffusion representing acute infarction are observed. These are located in multiple vascular territories. Subcentimeter BILATERAL cerebellar cortical and subcortical infarcts are seen. Linear LEFT paramedian acute pontine infarct. Scattered medial LEFT parietal and lateral LEFT occipital cortical and subcortical acute infarcts. Largest area of infarction affects the LEFT thalamus, 10 x 14 mm. All of these acute infarcts lie within the distribution of the BILATERAL vertebral arteries and basilar artery, and felt to represent  posterior circulation event.  No acute or chronic hemorrhage, mass lesion, hydrocephalus, or extra-axial fluid.  Generalized atrophy. Mild subcortical and periventricular T2 and FLAIR hyperintensities, likely chronic microvascular ischemic change. Flow voids are maintained in the carotid, basilar, and both distal vertebral arteries.  BILATERAL cataract extraction. Chronic sinus disease. No mastoid fluid. No osseous findings. No midline abnormality. Compared with prior CT, the infarcts are not visible on that study.  IMPRESSION: Multifocal areas of acute infarction affecting the cerebellum, LEFT paramedian pons, LEFT thalamus, and LEFT posterior parietal and occipital regions. Posterior circulation event is suspected.  Generalized atrophy and mild small vessel disease, not unexpected for age.  These results will be called to the ordering clinician or representative by the Radiologist Assistant, and communication documented in the PACS or zVision Dashboard.   Electronically Signed   By: Staci Righter M.D.   On: 02/17/2015 11:01    Assessment: 81 y.o. female with acute onset confusion, right face-right arm-leg weakness, and MRI disclosing acute infarctions involving different cortical and subcortical regions within the posterior circulation, pattern highly suggestive of cerebral embolism. Neuro-exam significant for preserved mental status, left ptosis with dysconjugate gaze and left medial rectus palsy. Although preliminary biopsy results are suggestive of colonic adenocarcinoma which raises concern for possible cerebral embolism in the context of hypercoagulable state, other potential causes of embolism must be excluded.  Will obtain CTA brain and neck as soon as possible to ensure there is not an unstable clot causing patient neurological syndrome. Decision about need for heparin will depend on the presence or not of intramural clot in the posterior circulation or evidence of neurological worsening. Complete  stroke work up. Family is certainly upset regarding the manner in how patient neurological changes were managed last night, and are dismay about the fact that patient was not treated with tpa despite the fact that they are sure patient was within the window for such treatment. I answered their questions to the best of my knowledge and emphasized the fact that our neurology service was not involved in the decision making process until just now.  Stroke Risk Factors - age, HTN  Plan: 1. HgbA1c, fasting lipid panel 2. MRI, MRA  of the brain without contrast 3. Echocardiogram 4. Carotid dopplers 5. Prophylactic therapy-depending on results CTA brain/neck 6. Risk factor modification 7. Telemetry monitoring 8. Frequent neuro checks 9. PT/OT SLP  Dorian Pod, MD [facility] [phone]  02/17/2015, 2:32 PM

## 2015-02-17 NOTE — Evaluation (Signed)
Physical Therapy Evaluation Patient Details Name: Katherine Cooke MRN: [id number] DOB: 1925/10/01 Today's Date: 02/17/2015   History of Present Illness  Katherine Cooke is a 81 y.o. 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 She says that she has felt a lump in the left lower quadrant for about 5 years, and during this time has had alternating periods of diarrhea and constipation.  Note that she now has colon mass and is scheduled for sx tomorrow, but also had increasing confusion and R sided weakness, CT negative, but is now pending MRI for confirmation of CVA.    Clinical Impression  Pt presents with above.  Note new onset of R sided weakness and increased confusion during the night.  CT negative for acute CVA.  Upon PT eval, note expressive aphasia, R facial drooping, inability of R eye to track to R and R upper quadrant as well as decreased convergence as R eye tends to stay to the R and inferior.  Also note decreased proprioception during gait as well as possible perceptual deficits.  MD in room following eval and PT remained to discuss findings.  MD now recommending MRI to confirm/rule out CVA.  Family in room and PT/MD provided education and PT recommendations for OT and SLP evaluations.  PT recommending CIR consult as pt was very independent prior to admission.  Note family states she is scheduled for abdominal surgery tomorrow, PT to keep checking with pt regarding functional status.  Family verbalized understanding.      Follow Up Recommendations CIR;Supervision/Assistance - 24 hour    Equipment Recommendations  Other (comment) (TBD)    Recommendations for Other Services Rehab consult;OT consult;Speech consult     Precautions / Restrictions Precautions Precautions: Fall Precaution  Comments: slight R hemiplegia, R lateral lean Restrictions Weight Bearing Restrictions: No      Mobility  Bed Mobility Overal bed mobility: Needs Assistance Bed Mobility: Supine to Sit     Supine to sit: Supervision;HOB elevated     General bed mobility comments: Pt able to sit at EOB with HOB elevated and with use of bed rail.  Upon sitting, note LOB to the R needing min A to correct.   Transfers Overall transfer level: Needs assistance Equipment used: 1 person hand held assist (R arm over therapist shoulder) Transfers: Sit to/from Stand Sit to Stand: Mod assist         General transfer comment: Pt currently requires mod A with facilitation at hips for forward weight shift and trunk lean as well as facilitation to increase WB on RLE.   Ambulation/Gait Ambulation/Gait assistance: Mod assist;Max assist Ambulation Distance (Feet): 10 Feet Assistive device: 1 person hand held assist (R arm around therapist) Gait Pattern/deviations: Step-to pattern;Step-through pattern;Decreased step length - left;Decreased stance time - left;Decreased stride length;Decreased dorsiflexion - right;Trunk flexed;Decreased weight shift to left;Narrow base of support     General Gait Details: Pt able to take several steps to outside doorway, however note increased R lateral lean (? slight pushing) as well as increased difficulty with R LE placement and tends to over shoot step.    Stairs            Wheelchair Mobility    Modified Rankin (Stroke Patients Only)       Balance Overall balance assessment: Needs assistance  Sitting-balance support: Feet supported;Single extremity supported Sitting balance-Leahy Scale: Poor Sitting balance - Comments: Requires intermittent min to mod A to maintain midline posture on EOB.  Postural control: Right lateral lean Standing balance support: During functional activity;Single extremity supported Standing balance-Leahy Scale: Poor Standing balance  comment: Requires up to mod A to maintain standing position.                              Pertinent Vitals/Pain Pain Assessment: Faces Faces Pain Scale: Hurts little more Pain Location: L foot  Pain Descriptors / Indicators: Grimacing;Guarding Pain Intervention(s): Limited activity within patient's tolerance;Monitored during session;Repositioned    Home Living Family/patient expects to be discharged to:: Private residence Living Arrangements: Alone (independent living at [facility] (sp) can transition to SNF/ALF if needed) Available Help at Discharge: Family Type of Home: Apartment Home Access: Level entry     Home Layout: One level Home Equipment: Grab bars - tub/shower      Prior Function Level of Independence: Independent         Comments: Pt living on her own, ambulating to/from dining hall.      Hand Dominance        Extremity/Trunk Assessment               Lower Extremity Assessment: RLE deficits/detail RLE Deficits / Details: R hip flex 3+/5, knee ext 4+/5, knee flex 4/5, ankle DF 1/5, PF 1/5    Cervical / Trunk Assessment: Kyphotic  Communication   Communication: Expressive difficulties  Cognition Arousal/Alertness: Lethargic   Overall Cognitive Status: Impaired/Different from baseline Area of Impairment: Orientation;Attention;Following commands;Awareness;Safety/judgement Orientation Level: Disoriented to;Time;Situation Current Attention Level: Sustained Memory: Decreased recall of precautions Following Commands: Follows one step commands consistently;Follows multi-step commands inconsistently   Awareness: Intellectual   General Comments: Pt with what seems like expressive aphasia during PT eval    General Comments      Exercises        Assessment/Plan    PT Assessment Patient needs continued PT services  PT Diagnosis Abnormality of gait;Difficulty walking;Generalized weakness;Hemiplegia dominant side   PT Problem List  Decreased strength;Decreased activity tolerance;Decreased balance;Decreased mobility;Decreased coordination;Decreased cognition;Decreased knowledge of use of DME;Decreased safety awareness;Decreased knowledge of precautions;Impaired sensation;Pain  PT Treatment Interventions DME instruction;Gait training;Functional mobility training;Therapeutic activities;Therapeutic exercise;Balance training;Neuromuscular re-education;Cognitive remediation;Patient/family education   PT Goals (Current goals can be found in the Care Plan section) Acute Rehab PT Goals Patient Stated Goal: n/a, per family to regain independence PT Goal Formulation: With patient/family Time For Goal Achievement: 03/03/15 Potential to Achieve Goals: Good    Frequency Min 5X/week   Barriers to discharge   Lives at [facility] and can transition to ALF/SNF as needed.     Co-evaluation               End of Session Equipment Utilized During Treatment: Gait belt Activity Tolerance: Patient limited by pain;Patient limited by fatigue Patient left: in chair;with call bell/phone within reach;with family/visitor present Nurse Communication: Mobility status         Time: 5631-4970 PT Time Calculation (min) (ACUTE ONLY): 32 min   Charges:   PT Evaluation $Initial PT Evaluation Tier I: 1 Procedure PT Treatments $Gait Training: 8-22 mins   PT G Codes:        Denice Bors 02/17/2015, 9:27 AM

## 2015-02-17 NOTE — Evaluation (Signed)
SLP Cancellation Note  Patient Details Name: Katherine Cooke MRN: [id number] DOB: 1925-12-21   Cancelled treatment:       Reason Eval/Treat Not Completed: Patient at procedure or test/unavailable   [name], [place] [facility] SLP [phone]

## 2015-02-18 ENCOUNTER — Other Ambulatory Visit (HOSPITAL_COMMUNITY): Payer: Medicare Other

## 2015-02-18 ENCOUNTER — Ambulatory Visit (HOSPITAL_COMMUNITY): Payer: Medicare Other

## 2015-02-18 DIAGNOSIS — C801 Malignant (primary) neoplasm, unspecified: Secondary | ICD-10-CM

## 2015-02-18 DIAGNOSIS — I639 Cerebral infarction, unspecified: Secondary | ICD-10-CM

## 2015-02-18 DIAGNOSIS — M79609 Pain in unspecified limb: Secondary | ICD-10-CM

## 2015-02-18 LAB — BASIC METABOLIC PANEL
ANION GAP: 7 (ref 5–15)
CALCIUM: 8.2 mg/dL — AB (ref 8.9–10.3)
CO2: 24 mmol/L (ref 22–32)
Chloride: 102 mmol/L (ref 101–111)
Creatinine, Ser: 0.69 mg/dL (ref 0.44–1.00)
GFR calc Af Amer: >60 mL/min (ref >60)
GLUCOSE: 118 mg/dL — AB (ref 65–99)
POTASSIUM: 3.3 mmol/L — AB (ref 3.5–5.1)
SODIUM: 133 mmol/L — AB (ref 135–145)

## 2015-02-18 LAB — CBC
HCT: 30.6 % — ABNORMAL LOW (ref 36.0–46.0)
Hemoglobin: 10.1 g/dL — ABNORMAL LOW (ref 12.0–15.0)
MCH: 32 pg (ref 26.0–34.0)
MCHC: 33 g/dL (ref 30.0–36.0)
MCV: 96.8 fL (ref 78.0–100.0)
Platelets: 401 10*3/uL — ABNORMAL HIGH (ref 150–400)
RBC: 3.16 MIL/uL — ABNORMAL LOW (ref 3.87–5.11)
RDW: 13 % (ref 11.5–15.5)
WBC: 11.7 10*3/uL — AB (ref 4.0–10.5)

## 2015-02-18 LAB — LIPID PANEL
CHOLESTEROL: 122 mg/dL (ref 0–200)
HDL: 41 mg/dL (ref >40)
LDL Cholesterol: 63 mg/dL (ref 0–99)
TRIGLYCERIDES: 88 mg/dL (ref <150)
Total CHOL/HDL Ratio: 3 RATIO
VLDL: 18 mg/dL (ref 0–40)

## 2015-02-18 LAB — CEA: CEA: 1.8 ng/mL (ref 0.0–4.7)

## 2015-02-18 LAB — CA 125: CA 125: 314.5 U/mL — AB (ref 0.0–38.1)

## 2015-02-18 LAB — CANCER ANTIGEN 19-9: CA 19 9: 54 U/mL — AB (ref 0–35)

## 2015-02-18 MED ORDER — ENOXAPARIN SODIUM 60 MG/0.6ML ~~LOC~~ SOLN
1.0000 mg/kg | SUBCUTANEOUS | Status: DC
  Administered 2015-02-19 – 2015-02-21 (×3): 45 mg via SUBCUTANEOUS
  Filled 2015-02-18 (×5): qty 0.6

## 2015-02-18 MED ORDER — PIPERACILLIN-TAZOBACTAM 3.375 G IVPB
3.3750 g | Freq: Three times a day (TID) | INTRAVENOUS | Status: DC
  Filled 2015-02-18 (×2): qty 50

## 2015-02-18 MED ORDER — PIPERACILLIN-TAZOBACTAM 3.375 G IVPB 30 MIN
3.3750 g | Freq: Once | INTRAVENOUS | Status: DC
  Filled 2015-02-18: qty 50

## 2015-02-18 MED ORDER — ENOXAPARIN SODIUM 60 MG/0.6ML ~~LOC~~ SOLN
1.0000 mg/kg | Freq: Two times a day (BID) | SUBCUTANEOUS | Status: DC
  Filled 2015-02-18 (×2): qty 0.6

## 2015-02-18 NOTE — Progress Notes (Signed)
Patient ID: Katherine Cooke, female   DOB: 20-Nov-1925, 81 y.o.   MRN: [id number]     [address] [place], [place] [address]    Phone: [phone] FAX: [phone]     Subjective: No n/v.  Tolerating clears.   Objective:  Vital signs:  Filed Vitals:   02/18/15 0200 02/18/15 0400 02/18/15 1049 02/18/15 1333  BP: 139/60 148/68 127/59 133/58  Pulse: 82 88 79 89  Temp: 97.5 F (36.4 C) 98.2 F (36.8 C) 98 F (36.7 C) 97.6 F (36.4 C)  TempSrc: Oral Axillary Oral Oral  Resp: $Remo'20 14 20 20  'RdZeZ$ Height:      Weight:      SpO2: 93% 95% 96% 98%    Last BM Date: 02/17/15  Intake/Output   Yesterday:  09/14 0701 - 09/15 0700 In: 575 [P.O.:240; I.V.:235; IV Piggyback:100] Out: -  This shift:    I/O last 3 completed shifts: In: 3757.5 [P.O.:480; I.V.:2077.5; IV Piggyback:1200] Out: 400 [Urine:400]    Physical Exam: General: Pt awake/alert/oriented x4 in no acute distress  Abdomen: Soft.  Somewhat distended.  ttp llq.    No evidence of peritonitis.  No incarcerated hernias.   Problem List:   Principal Problem:   Colonic mass Active Problems:   Hypertension   Osteopenia   Leukocytosis   Anemia of chronic disease   CVA (cerebral infarction)    Results:   Labs: Results for orders placed or performed during the hospital encounter of 02/13/15 (from the past 48 hour(s))  Glucose, capillary     Status: None   Collection Time: 02/16/15 10:43 PM  Result Value Ref Range   Glucose-Capillary 98 65 - 99 mg/dL   Comment 1 Notify RN   CA 125     Status: Abnormal   Collection Time: 02/17/15  4:13 AM  Result Value Ref Range   CA 125 314.5 (H) 0.0 - 38.1 U/mL    Comment: (NOTE) Roche ECLIA methodology Performed At: [facility] [place], [place] [id number] Lindon Romp MD [id number]   Cancer antigen 19-9     Status: Abnormal   Collection Time: 02/17/15  4:13 AM  Result Value Ref  Range   CA 19-9 54 (H) 0 - 35 U/mL    Comment: (NOTE) Roche ECLIA methodology Performed At: [facility] [place], [place] [id number] Lindon Romp MD [id number]   CEA     Status: None   Collection Time: 02/17/15  4:13 AM  Result Value Ref Range   CEA 1.8 0.0 - 4.7 ng/mL    Comment: (NOTE)       Roche ECLIA methodology       Nonsmokers  <3.9                                     Smokers     <5.6 Performed At: [facility] [place], [place] [id number] Lindon Romp MD [id number]   CBC     Status: Abnormal   Collection Time: 02/17/15  4:13 AM  Result Value Ref Range   WBC 12.2 (H) 4.0 - 10.5 K/uL   RBC 2.87 (L) 3.87 - 5.11 MIL/uL   Hemoglobin 9.4 (L) 12.0 - 15.0 g/dL   HCT 28.2 (L) 36.0 - 46.0 %   MCV 98.3 78.0 - 100.0  fL   MCH 32.8 26.0 - 34.0 pg   MCHC 33.3 30.0 - 36.0 g/dL   RDW 13.0 11.5 - 15.5 %   Platelets 404 (H) 150 - 400 K/uL  Basic metabolic panel     Status: Abnormal   Collection Time: 02/17/15  4:13 AM  Result Value Ref Range   Sodium 136 135 - 145 mmol/L   Potassium 3.5 3.5 - 5.1 mmol/L   Chloride 106 101 - 111 mmol/L   CO2 23 22 - 32 mmol/L   Glucose, Bld 96 65 - 99 mg/dL   BUN <5 (L) 6 - 20 mg/dL   Creatinine, Ser 0.60 0.44 - 1.00 mg/dL   Calcium 8.0 (L) 8.9 - 10.3 mg/dL   GFR calc non Af Amer >60 >60 mL/min   GFR calc Af Amer >60 >60 mL/min    Comment: (NOTE) The eGFR has been calculated using the CKD EPI equation. This calculation has not been validated in all clinical situations. eGFR's persistently <60 mL/min signify possible Chronic Kidney Disease.    Anion gap 7 5 - 15  Basic metabolic panel     Status: Abnormal   Collection Time: 02/18/15  7:18 AM  Result Value Ref Range   Sodium 133 (L) 135 - 145 mmol/L   Potassium 3.3 (L) 3.5 - 5.1 mmol/L   Chloride 102 101 - 111 mmol/L   CO2 24 22 - 32 mmol/L   Glucose, Bld 118 (H) 65 - 99 mg/dL   BUN <5 (L) 6 - 20 mg/dL   Creatinine, Ser 0.69 0.44  - 1.00 mg/dL   Calcium 8.2 (L) 8.9 - 10.3 mg/dL   GFR calc non Af Amer >60 >60 mL/min   GFR calc Af Amer >60 >60 mL/min    Comment: (NOTE) The eGFR has been calculated using the CKD EPI equation. This calculation has not been validated in all clinical situations. eGFR's persistently <60 mL/min signify possible Chronic Kidney Disease.    Anion gap 7 5 - 15  CBC     Status: Abnormal   Collection Time: 02/18/15  7:18 AM  Result Value Ref Range   WBC 11.7 (H) 4.0 - 10.5 K/uL   RBC 3.16 (L) 3.87 - 5.11 MIL/uL   Hemoglobin 10.1 (L) 12.0 - 15.0 g/dL   HCT 30.6 (L) 36.0 - 46.0 %   MCV 96.8 78.0 - 100.0 fL   MCH 32.0 26.0 - 34.0 pg   MCHC 33.0 30.0 - 36.0 g/dL   RDW 13.0 11.5 - 15.5 %   Platelets 401 (H) 150 - 400 K/uL  Lipid panel     Status: None   Collection Time: 02/18/15  7:18 AM  Result Value Ref Range   Cholesterol 122 0 - 200 mg/dL   Triglycerides 88 <150 mg/dL   HDL 41 >40 mg/dL   Total CHOL/HDL Ratio 3.0 RATIO   VLDL 18 0 - 40 mg/dL   LDL Cholesterol 63 0 - 99 mg/dL    Comment:        Total Cholesterol/HDL:CHD Risk Coronary Heart Disease Risk Table                     Men   Women  1/2 Average Risk   3.4   3.3  Average Risk       5.0   4.4  2 X Average Risk   9.6   7.1  3 X Average Risk  23.4   11.0  Use the calculated Patient Ratio above and the CHD Risk Table to determine the patient's CHD Risk.        ATP III CLASSIFICATION (LDL):  <100     mg/dL   Optimal  100-129  mg/dL   Near or Above                    Optimal  130-159  mg/dL   Borderline  160-189  mg/dL   High  >190     mg/dL   Very High     Imaging / Studies: Ct Angio Head W/cm &/or Wo Cm  02/17/2015   CLINICAL DATA:  Stroke with cerebral ischemia. Difficulty speaking. Recent diagnosis of colon cancer.  EXAM: CT ANGIOGRAPHY HEAD AND NECK  TECHNIQUE: Multidetector CT imaging of the head and neck was performed using the standard protocol during bolus administration of intravenous contrast.  Multiplanar CT image reconstructions and MIPs were obtained to evaluate the vascular anatomy. Carotid stenosis measurements (when applicable) are obtained utilizing NASCET criteria, using the distal internal carotid diameter as the denominator.  CONTRAST:  66mL OMNIPAQUE IOHEXOL 350 MG/ML SOLN  COMPARISON:  MRI of the brain 02/17/2015.  FINDINGS: CT HEAD  Brain: The acute left thalamic infarct is less well seen by CT. Mild periventricular white matter changes are again noted bilaterally. No acute cortical infarct or hemorrhage is present. The ventricles are stable. No significant extra-axial fluid collection is present. Acute and remote lacunar infarcts are again noted in the cerebellum.  Calvarium and skull base: Within normal limits.  Paranasal sinuses: Cleared  Orbits: Bilateral lens replacements are noted. The globes and orbits are otherwise intact.  CTA NECK  Aortic arch: Atherosclerotic calcifications are present at the origins of the left common carotid artery and the left subclavian artery without significant stenosis. Minimal calcification is present along the undersurface of the aortic arch.  Right carotid system: Moderate tortuosity is present in the right common carotid artery without significant stenosis. Dense calcifications are present at the right carotid bifurcation. There is mild narrowing, less than 50% relative to the more distal vessel. The cervical right ICA is also tortuous without significant stenosis.  Left carotid system: The left common carotid artery is tortuous without significant stenosis. Dense atherosclerotic calcifications are present at the carotid bifurcation without significant stenosis. There is significant tortuosity of the cervical left ICA without significant stenosis.  Vertebral arteries:A 50% stenosis is present in the right subclavian artery just beyond the bifurcation of the carotid artery. Dense calcifications are present at the origins of the vertebral arteries  bilaterally. Mild to moderate stenoses are worse on the right. There is significant tortuosity of the proximal vertebral arteries bilaterally without additional significant stenoses in the cervical vertebral arteries. The left vertebral artery is slightly dominant to the right.  Skeleton: Bone windows demonstrate multilevel endplate degenerative change. There is chronic loss of height at C5-6 and C6-7. Grade 1 anterolisthesis is degenerative at C3-4 and C4-5. No focal lytic or blastic lesions are present.  Other neck: Mild dependent atelectasis is present at the lung apices. No significant cervical adenopathy is present. Salivary glands are within normal limits. The thyroid is unremarkable.  CTA HEAD  Anterior circulation: There is mild ectasia of the internal carotid arteries bilaterally from the high cervical segments through the ICA termini. The A1 and M1 segments are within normal limits. The anterior communicating artery is patent. MCA bifurcations are intact bilaterally. There is some attenuation of distal MCA branch vessels bilaterally  without significant proximal stenosis or occlusion.  Posterior circulation: The left vertebral artery is the dominant vessel. PICA origins are visualized and within normal limits. The basilar artery is within normal limits. Both posterior cerebral arteries are of fetal type with small contribution from the P1 segments.  Venous sinuses: The dural sinuses are patent. The straight sinus and deep cerebral veins are patent.  Anatomic variants: Bilateral fetal type posterior cerebral arteries with small P1 segments.  Delayed phase: No pathologic enhancement is present.  IMPRESSION: 1. Acute/subacute infarcts of the left thalamus and right cerebellum. 2. Moderate stenoses at the origins of the vertebral arteries bilaterally, worse on the right. 3. Tortuosity of the vertebral arteries without other focal stenoses. 4. The posterior cerebral arteries are of fetal type with a small P1  segments. 5. Marked tortuosity of the common and internal carotid arteries bilaterally without significant stenoses. 6. Dense atherosclerotic calcifications at the carotid bifurcations bilaterally without significant stenosis. 7. Multilevel spondylosis of the cervical spine.   Electronically Signed   By: San Morelle M.D.   On: 02/17/2015 18:34   Dg Chest 1 View  02/17/2015   CLINICAL DATA:  Delirium and weakness. 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 History of colon carcinoma.  EXAM: CHEST  1 VIEW  COMPARISON:  12/28/2010  FINDINGS: Cardiac silhouette is mildly enlarged. No mediastinal or hilar masses or evidence of adenopathy.  Lungs are clear.  No pleural effusion or pneumothorax.  Bony thorax is demineralized but grossly intact.  IMPRESSION: No active disease.   Electronically Signed   By: Lajean Manes M.D.   On: 02/17/2015 10:41   Ct Head Wo Contrast  02/17/2015   CLINICAL DATA:  New onset confusion tonight with right facial droop and blurred vision right eye. Recent diagnosis of colon cancer.  EXAM: CT HEAD WITHOUT CONTRAST  TECHNIQUE: Contiguous axial images were obtained from the base of the skull through the vertex without intravenous contrast.  COMPARISON:  02/23/2005  FINDINGS: Ventricles, cisterns and other CSF spaces are within normal. Mild bilateral basal ganglia calcifications are present. There is no mass, mass effect, shift of midline structures or acute hemorrhage. There is no evidence to suggest acute infarction. Remaining bones and soft tissues are within normal.  IMPRESSION: No acute intracranial findings.   Electronically Signed   By: Marin Olp M.D.   On: 02/17/2015 00:00   Ct Angio Neck W/cm &/or Wo/cm  02/17/2015   CLINICAL DATA:  Stroke with cerebral ischemia. Difficulty speaking. Recent diagnosis of colon cancer.  EXAM: CT ANGIOGRAPHY HEAD AND NECK  TECHNIQUE: Multidetector CT imaging of  the head and neck was performed using the standard protocol during bolus administration of intravenous contrast. Multiplanar CT image reconstructions and MIPs were obtained to evaluate the vascular anatomy. Carotid stenosis measurements (when applicable) are obtained utilizing NASCET criteria, using the distal internal carotid diameter as the denominator.  CONTRAST:  39mL OMNIPAQUE IOHEXOL 350 MG/ML SOLN  COMPARISON:  MRI of the brain 02/17/2015.  FINDINGS: CT HEAD  Brain: The acute left thalamic infarct is less well seen by CT. Mild periventricular white matter changes are again noted bilaterally. No acute cortical infarct or hemorrhage is present. The ventricles are stable. No significant extra-axial fluid collection is present. Acute and remote lacunar infarcts are again noted in the cerebellum.  Calvarium and skull base: Within normal limits.  Paranasal sinuses: Cleared  Orbits: Bilateral lens replacements are noted. The  globes and orbits are otherwise intact.  CTA NECK  Aortic arch: Atherosclerotic calcifications are present at the origins of the left common carotid artery and the left subclavian artery without significant stenosis. Minimal calcification is present along the undersurface of the aortic arch.  Right carotid system: Moderate tortuosity is present in the right common carotid artery without significant stenosis. Dense calcifications are present at the right carotid bifurcation. There is mild narrowing, less than 50% relative to the more distal vessel. The cervical right ICA is also tortuous without significant stenosis.  Left carotid system: The left common carotid artery is tortuous without significant stenosis. Dense atherosclerotic calcifications are present at the carotid bifurcation without significant stenosis. There is significant tortuosity of the cervical left ICA without significant stenosis.  Vertebral arteries:A 50% stenosis is present in the right subclavian artery just beyond the  bifurcation of the carotid artery. Dense calcifications are present at the origins of the vertebral arteries bilaterally. Mild to moderate stenoses are worse on the right. There is significant tortuosity of the proximal vertebral arteries bilaterally without additional significant stenoses in the cervical vertebral arteries. The left vertebral artery is slightly dominant to the right.  Skeleton: Bone windows demonstrate multilevel endplate degenerative change. There is chronic loss of height at C5-6 and C6-7. Grade 1 anterolisthesis is degenerative at C3-4 and C4-5. No focal lytic or blastic lesions are present.  Other neck: Mild dependent atelectasis is present at the lung apices. No significant cervical adenopathy is present. Salivary glands are within normal limits. The thyroid is unremarkable.  CTA HEAD  Anterior circulation: There is mild ectasia of the internal carotid arteries bilaterally from the high cervical segments through the ICA termini. The A1 and M1 segments are within normal limits. The anterior communicating artery is patent. MCA bifurcations are intact bilaterally. There is some attenuation of distal MCA branch vessels bilaterally without significant proximal stenosis or occlusion.  Posterior circulation: The left vertebral artery is the dominant vessel. PICA origins are visualized and within normal limits. The basilar artery is within normal limits. Both posterior cerebral arteries are of fetal type with small contribution from the P1 segments.  Venous sinuses: The dural sinuses are patent. The straight sinus and deep cerebral veins are patent.  Anatomic variants: Bilateral fetal type posterior cerebral arteries with small P1 segments.  Delayed phase: No pathologic enhancement is present.  IMPRESSION: 1. Acute/subacute infarcts of the left thalamus and right cerebellum. 2. Moderate stenoses at the origins of the vertebral arteries bilaterally, worse on the right. 3. Tortuosity of the vertebral  arteries without other focal stenoses. 4. The posterior cerebral arteries are of fetal type with a small P1 segments. 5. Marked tortuosity of the common and internal carotid arteries bilaterally without significant stenoses. 6. Dense atherosclerotic calcifications at the carotid bifurcations bilaterally without significant stenosis. 7. Multilevel spondylosis of the cervical spine.   Electronically Signed   By: San Morelle M.D.   On: 02/17/2015 18:34   Mr Brain Wo Contrast  02/17/2015   CLINICAL DATA:  Confusion with slurred speech and difficulty with word finding. Blurred vision. Symptoms began 02/16/2015.  EXAM: MRI HEAD WITHOUT CONTRAST  TECHNIQUE: Multiplanar, multiecho pulse sequences of the brain and surrounding structures were obtained without intravenous contrast.  COMPARISON:  CT head 02/16/2015.  FINDINGS: Multifocal areas of restricted diffusion representing acute infarction are observed. These are located in multiple vascular territories. Subcentimeter BILATERAL cerebellar cortical and subcortical infarcts are seen. Linear LEFT paramedian acute pontine infarct. Scattered medial LEFT  parietal and lateral LEFT occipital cortical and subcortical acute infarcts. Largest area of infarction affects the LEFT thalamus, 10 x 14 mm. All of these acute infarcts lie within the distribution of the BILATERAL vertebral arteries and basilar artery, and felt to represent posterior circulation event.  No acute or chronic hemorrhage, mass lesion, hydrocephalus, or extra-axial fluid.  Generalized atrophy. Mild subcortical and periventricular T2 and FLAIR hyperintensities, likely chronic microvascular ischemic change. Flow voids are maintained in the carotid, basilar, and both distal vertebral arteries.  BILATERAL cataract extraction. Chronic sinus disease. No mastoid fluid. No osseous findings. No midline abnormality. Compared with prior CT, the infarcts are not visible on that study.  IMPRESSION: Multifocal  areas of acute infarction affecting the cerebellum, LEFT paramedian pons, LEFT thalamus, and LEFT posterior parietal and occipital regions. Posterior circulation event is suspected.  Generalized atrophy and mild small vessel disease, not unexpected for age.  These results will be called to the ordering clinician or representative by the Radiologist Assistant, and communication documented in the PACS or zVision Dashboard.   Electronically Signed   By: Staci Righter M.D.   On: 02/17/2015 11:01    Medications / Allergies:  Scheduled Meds: . aspirin  325 mg Oral Daily  . cycloSPORINE  1 drop Both Eyes BID  . docusate sodium  200 mg Oral QHS  . enoxaparin (LOVENOX) injection  40 mg Subcutaneous Q24H  . feeding supplement  1 Container Oral TID BM  . montelukast  10 mg Oral QHS  . saccharomyces boulardii  250 mg Oral BID   Continuous Infusions: . sodium chloride 75 mL/hr at 02/17/15 1856   PRN Meds:.acetaminophen, diphenhydrAMINE, morphine, oxyCODONE  Antibiotics: Anti-infectives    Start     Dose/Rate Route Frequency Ordered Stop   02/18/15 1500  piperacillin-tazobactam (ZOSYN) IVPB 3.375 g  Status:  Discontinued     3.375 g 12.5 mL/hr over 240 Minutes Intravenous Every 8 hours 02/18/15 0836 02/18/15 1244   02/18/15 0830  piperacillin-tazobactam (ZOSYN) IVPB 3.375 g  Status:  Discontinued     3.375 g 100 mL/hr over 30 Minutes Intravenous  Once 02/18/15 0827 02/18/15 1244   02/15/15 1800  ciprofloxacin (CIPRO) IVPB 400 mg  Status:  Discontinued     400 mg 200 mL/hr over 60 Minutes Intravenous Every 12 hours 02/15/15 1629 02/18/15 0813   02/15/15 1700  metroNIDAZOLE (FLAGYL) IVPB 500 mg  Status:  Discontinued     500 mg 100 mL/hr over 60 Minutes Intravenous Every 8 hours 02/15/15 1629 02/18/15 0813   02/15/15 1200  amoxicillin-clavulanate (AUGMENTIN) 875-125 MG per tablet 1 tablet  Status:  Discontinued     1 tablet Oral Every 12 hours 02/15/15 1058 02/15/15 1629   02/14/15 2200   piperacillin-tazobactam (ZOSYN) IVPB 3.375 g  Status:  Discontinued     3.375 g 12.5 mL/hr over 240 Minutes Intravenous 3 times per day 02/14/15 2051 02/15/15 1105   02/13/15 1900  metroNIDAZOLE (FLAGYL) tablet 500 mg     500 mg Oral  Once 02/13/15 1847 02/13/15 1904   02/13/15 1900  ciprofloxacin (CIPRO) tablet 500 mg     500 mg Oral  Once 02/13/15 1847 02/13/15 1904        Assessment/Plan Poorly differentiated adenocarcinoma of colon The patient is partially obstructed.  She is able to tolerate POs and having bowel movements.  Recommend BID miralax, low fiber diet and adequate oral hydration.  She does not need urgent surgery.  I discussed with neurology who would ideally  like to wait at least 2 weeks for surgery given new stroke.  We will have her follow up Dr. Hassell Done and oncology.     Erby Pian, [facility] Pager [phone]) For consults and floor pages call [phone]([id number])  02/18/2015 1:55 PM

## 2015-02-18 NOTE — Progress Notes (Signed)
VASCULAR LAB PRELIMINARY  PRELIMINARY  PRELIMINARY  PRELIMINARY  Bilateral lower extremity venous duplex completed.    Preliminary report:  There is no DVT or SVT noted in the bilateral lower extremities.  Katherine Cooke, RVT 02/18/2015, 10:00 AM

## 2015-02-18 NOTE — Evaluation (Signed)
Occupational Therapy Evaluation Patient Details Name: Katherine Cooke MRN: [id number] DOB: [birth year] Today's Date: 02/18/2015    History of Present Illness Pt admitted to [facility] with colonic mass. Developed facial droop, vision and speech changes.  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 Transferred to [facility].   Clinical Impression   Pt was independent prior to admission.  Presents with impaired balance, generalized weakness, decreased activity tolerance and vision changes impacting ability to perform ADL and ADL transfers at her baseline.  Pt will need intensive rehab prior to return home.  Will follow acutely.   Follow Up Recommendations  CIR    Equipment Recommendations   (to be determined)    Recommendations for Other Services       Precautions / Restrictions Precautions Precautions: Fall Restrictions Weight Bearing Restrictions: No      Mobility Bed Mobility      General bed mobility comments: pt in chair  Transfers Overall transfer level: Needs assistance Equipment used: 1 person hand held assist Transfers: Sit to/from Stand;Stand Pivot Transfers Sit to Stand: Min assist Stand pivot transfers: Min assist      General transfer comment: pt reliant on UEs, stood slowly, required steadying assist, but no assist to rise, initially reluctant to release arms of chair in standing    Balance Overall balance assessment: Needs assistance Sitting-balance support: No upper extremity supported Sitting balance-Leahy Scale: Fair Sitting balance - Comments: no LOB with LB dressing in sitting   Standing balance support: Bilateral upper extremity supported Standing balance-Leahy Scale: Poor Standing balance comment: UE support and min assist for balance                            ADL Overall ADL's : Needs assistance/impaired Eating/Feeding: Set up;Sitting Eating/Feeding Details  (indicate cue type and reason): pt on liquid diet, poor appetite Grooming: Wash/dry hands;Wash/dry face;Sitting;Set up   Upper Body Bathing: Minimal assitance;Sitting   Lower Body Bathing: Minimal assistance;Sit to/from stand   Upper Body Dressing : Minimal assistance;Sitting   Lower Body Dressing: Minimal assistance;Sit to/from stand Lower Body Dressing Details (indicate cue type and reason): able to don and doff socks Toilet Transfer: Minimal assistance;Stand-pivot   Toileting- Clothing Manipulation and Hygiene: Minimal assistance;Sitting/lateral lean;Sit to/from stand;Moderate assistance               Vision Vision Assessment?: Vision impaired- to be further tested in functional context (dysconjugate gaze, L eye with difficulty tracking R)   Perception Perception Comments: some difficulties with R/L discrimination, able to locate designated items on tray table.     Praxis      Pertinent Vitals/Pain Pain Assessment: Faces Faces Pain Scale: Hurts a little bit Pain Location: back Pain Descriptors / Indicators: Grimacing Pain Intervention(s): Limited activity within patient's tolerance;Monitored during session     Hand Dominance Right   Extremity/Trunk Assessment Upper Extremity Assessment Upper Extremity Assessment: Generalized weakness (did not note particularly weakness on one side)   Lower Extremity Assessment Lower Extremity Assessment: Defer to PT evaluation   Cervical / Trunk Assessment Cervical / Trunk Assessment: Kyphotic (hx of spinal stenosis per son)   Communication Communication Communication: Expressive difficulties   Cognition Arousal/Alertness: Awake/alert Behavior During Therapy: WFL for tasks assessed/performed Overall Cognitive Status: Difficult to assess     Current Attention Level: Selective   Following Commands: Follows multi-step commands inconsistently       General  Comments: Pt able to follow commands and answer most questions  clearly. Cognition difficult to assess due to expressive difficulties.   General Comments       Exercises       Shoulder Instructions      Home Living Family/patient expects to be discharged to:: Private residence Living Arrangements: Alone Available Help at Discharge: Family Type of Home: Independent living facility Home Access: Level entry     [place]: One level     Bathroom Shower/Tub: Occupational psychologist: Handicapped height     Home Equipment: Grab bars - tub/shower;Grab bars - toilet   Additional Comments: Pt lives in [place] at [place].      Prior Functioning/Environment Level of Independence: Independent        Comments: Pt living on her own, ambulating to/from dining hall, driving.    OT Diagnosis: Generalized weakness;Disturbance of vision   OT Problem List: Decreased strength;Decreased activity tolerance;Impaired balance (sitting and/or standing);Impaired vision/perception;Decreased knowledge of use of DME or AE   OT Treatment/Interventions: Self-care/ADL training;DME and/or AE instruction;Visual/perceptual remediation/compensation;Patient/family education;Balance training;Therapeutic activities    OT Goals(Current goals can be found in the care plan section) Acute Rehab OT Goals Patient Stated Goal: n/a, per family to regain independence OT Goal Formulation: With patient Time For Goal Achievement: 03/04/15 Potential to Achieve Goals: Good ADL Goals Pt Will Perform Grooming: with supervision;standing Pt Will Perform Upper Body Bathing: with supervision;sitting Pt Will Perform Lower Body Bathing: with supervision;sit to/from stand Pt Will Perform Upper Body Dressing: with supervision;sitting Pt Will Perform Lower Body Dressing: with supervision;sit to/from stand Pt Will Transfer to Toilet: with supervision;ambulating;regular height toilet Pt Will Perform Toileting - Clothing Manipulation and hygiene: with supervision;sit to/from  stand Additional ADL Goal #1: Pt will participate in further vision testing to determine impact on ADL and mobility.  OT Frequency: Min 3X/week   Barriers to D/C:            Co-evaluation              End of Session    Activity Tolerance: Patient tolerated treatment well Patient left: in chair;with call bell/phone within reach;with family/visitor present   Time: 1350-1425 OT Time Calculation (min): 35 min Charges:  OT General Charges $OT Visit: 1 Procedure OT Evaluation $Initial OT Evaluation Tier I: 1 Procedure OT Treatments $Self Care/Home Management : 8-22 mins G-Codes:    Malka So 02/18/2015, 3:21 PM [phone]

## 2015-02-18 NOTE — Progress Notes (Signed)
  Echocardiogram 2D Echocardiogram has been performed.  Katherine Cooke 02/18/2015, 11:52 AM

## 2015-02-18 NOTE — Progress Notes (Signed)
Speech Language Pathology Treatment: Dysphagia  Patient Details Name: Katherine Cooke MRN: [id number] DOB: 1925/08/15 Today's Date: 02/18/2015 Time: 4503-8882 SLP Time Calculation (min) (ACUTE ONLY): 10 min  Assessment / Plan / Recommendation Clinical Impression  F/u after yesterday's swallow evaluation: Pt feeding herself lunch; continues on clear liquid diet and tolerating well with no s/s of aspiration and min cues to decrease rate, attend to oral spilling from right.  SLP will follow for toleration and diet advancement once cleared per MD.    HPI Other Pertinent Information: 81 yo female adm to [facility] with colonic mass.  Pt found to have expressive aphasia, facial droop, vision changes, speech changes with concern for possible CVA.  Pt cva confirmed - per MRI -.  Multifocal areas of acute infarction affecting the cerebellum, LEFT paramedian pons, LEFT thalamus, and LEFT posterior parietal and occipital lobe.  Most recent CXR negative.  Swallow evaluation ordered.    Pertinent Vitals Pain Assessment: Faces Faces Pain Scale: Hurts a little bit Pain Location: back Pain Descriptors / Indicators: Grimacing Pain Intervention(s): Limited activity within patient's tolerance;Monitored during session  SLP Plan  Continue with current plan of care    Recommendations Diet recommendations:  (clear liquids) Liquids provided via: Cup;Straw Medication Administration: Whole meds with puree Supervision: Patient able to self feed Compensations: Slow rate;Small sips/bites Postural Changes and/or Swallow Maneuvers: Seated upright 90 degrees              Oral Care Recommendations: Oral care BID Plan: Continue with current plan of care    GO     [name] 02/18/2015, 3:36 PM

## 2015-02-18 NOTE — Care Management Important Message (Signed)
Important Message  Patient Details  Name: Katherine Cooke MRN: [id number] Date of Birth: Jun 21, 1925   Medicare Important Message Given:  Yes-third notification given    [name] 02/18/2015, 8:43 AM

## 2015-02-18 NOTE — Progress Notes (Addendum)
[facility] PROGRESS NOTE  Katherine Cooke JJO:[id number] DOB: Oct 20, 1925 DOA: 02/13/2015 PCP: Idamae Schuller, MD    89?  hypertension,  spinal stenosis, admitted to the medicine service on 02/14/2015 presenting with complaints of lower abdominal pain. She reported having a small amount of blood morning of admission.   CT scan of abdomen and pelvis that showed masslike soft tissue involving the sigmoid colon that was concerning for malignancy. GI was consulted.  On 02/15/2015 she underwent sigmoidoscopy with biopsy. Pathology reported adenocarcinoma.  General surgery was consulted regarding possibility of surgical excision of mass.   02/16/2015 she had mental status change + right-sided facial droop with associated right-sided weakness.  MRI of the brain was ordered which unfortunately revealed multifocal areas of acute infarction affecting cerebellum, left pons, left thalamus and left posterior parietal and occipital regions. Dr. Armida Sans of neurology who recommended starting aspirin 325 mg by mouth every daily and transferring patient to [facility] for further evaluation and treatment.   Assessment/Plan: 1. Acute CVA. -Patient initially admitted for abdominal complaints, having CT scan of abdomen and pelvis that revealed sigmoid mass. After undergoing sigmoidoscopy with biopsy, found to have adenocarcinoma. -9/13 = mental status changes. CT scan of brain did not show acute intracranial abnormalities however further workup with MRI of brain showed multifocal areas of acute infarct. -CT angiogram as above -Await results of transthoracic echocardiogram, carotid Dopplers, MRA, bilateral lower extremity venous Dopplers.  -neurology who recommended initiating aspirin 325 mg by mouth daily  2.  Adenocarcinoma  -Patient diagnosed with adenocarcinoma affecting sigmoid during this hospitalization, undergoing a sigmoidoscopy on 02/15/2015 with biopsies of mass. Unfortunately this  revealed poorly differentiated adenocarcinoma. -It is possible underlying malignancy led to a hypercoagulable state, ultimately leading to stroke of thromboembolic phenomenon. -She remains on ciprofloxacin and Flagyl given concerns for necrosis and infection--I would defer continued use of this general surgery and would narrowed to Zosyn -Given acute CVA surgery will need to be postponed--I would defer to neurology to make that decision and coordinate this with general surgery   3.  Hyponatremia. -Likely secondary to prerenal azotemia -Sodium improved from 133-136  4. Leukocytosis + mild thromobocytosis Unclear etiology--could be subclinical aspiration Afebrile currently therefore no antibiotics and we'll just monitor   5.Hypertension. -Blood pressures are stable  6. AKI Initial creat 1.51 On saline-Continue and resolving   Consultants:  Neurology  Gen surgery  GI Dr. Cristina Gong  Oncology Dr. Earlie Server   Antibiotics:  Ciprofloxacin  Flagyl  CTA head  1. Acute/subacute infarcts of the left thalamus and right cerebellum. 2. Moderate stenoses at the origins of the vertebral arteries bilaterally, worse on the right. 3. Tortuosity of the vertebral arteries without other focal stenoses. 4. The posterior cerebral arteries are of fetal type with a small P1 segments. 5. Marked tortuosity of the common and internal carotid arteries bilaterally without significant stenoses. 6. Dense atherosclerotic calcifications at the carotid bifurcations bilaterally without significant stenosis. 7. Multilevel spondylosis of the cervical spine.  Objective: Filed Vitals:   02/18/15 0400  BP: 148/68  Pulse: 88  Temp: 98.2 F (36.8 C)  Resp: 14    Intake/Output Summary (Last 24 hours) at 02/18/15 0715 Last data filed at 02/17/15 1400  Gross per 24 hour  Intake    575 ml  Output      0 ml  Net    575 ml   [facility] Weights   02/13/15 2116 02/15/15 1448  Weight: 48.535 kg (107 lb)  46.72 kg (103 lb)  Exam:   General:  Ill-appearing, patient having a right-sided facial droop with slurred speech. She is awake and alert and can follow some commands  Cardiovascular: Regular rate and rhythm normal S1-S2 no murmurs rubs or gallops  Respiratory: Normal respiratory for, lungs are clear to auscultation bilaterally no wheezing rhonchi or rales  Abdomen: Patient having mild tenderness to palpation over lower, region  Neuur: facial droop with disconjugate rightward gaze, no tongue deviation, she had 4-5 muscle strength to her right upper extremity and 4 out of 5 muscle strength to her right lower extremity. Deep tendon reflexes were intact, there was no alteration to sensation to extremities.  Data Reviewed: Basic Metabolic Panel:  Recent Labs Lab 02/13/15 1217 02/14/15 0512 02/15/15 0404 02/16/15 0421 02/17/15 0413  NA 131* 132* 133* 133* 136  K 4.1 3.6 3.8 3.7 3.5  CL 96* 100* 101 103 106  CO2 '26 26 26 24 23  '$ GLUCOSE 105* 124* 107* 97 96  BUN '19 20 11 7 '$ <5*  CREATININE 1.15* 0.95 0.82 0.72 0.60  CALCIUM 9.0 8.1* 8.2* 8.0* 8.0*  MG  --  2.1  --   --   --   PHOS  --  3.6  --   --   --    Liver Function Tests:  Recent Labs Lab 02/13/15 1217 02/14/15 0512  AST 22 38  ALT 13* 13*  ALKPHOS 86 73  BILITOT 0.8 0.3  PROT 7.1 5.8*  ALBUMIN 3.1* 2.5*    Recent Labs Lab 02/13/15 1217  LIPASE 18*   No results for input(s): AMMONIA in the last 168 hours. CBC:  Recent Labs Lab 02/13/15 1217 02/14/15 0512 02/15/15 0404 02/16/15 0421 02/17/15 0413  WBC 16.3* 13.1* 12.0* 10.6* 12.2*  HGB 11.3* 10.1* 9.4* 9.4* 9.4*  HCT 33.2* 29.9* 28.2* 27.9* 28.2*  MCV 98.2 98.0 98.6 98.2 98.3  PLT 472* 457* 422* 397 404*   Cardiac Enzymes: No results for input(s): CKTOTAL, CKMB, CKMBINDEX, TROPONINI in the last 168 hours. BNP (last 3 results) No results for input(s): BNP in the last 8760 hours.  ProBNP (last 3 results) No results for input(s): PROBNP in the  last 8760 hours.  CBG:  Recent Labs Lab 02/16/15 2243  GLUCAP 98    No results found for this or any previous visit (from the past 240 hour(s)).   Studies: Ct Angio Head W/cm &/or Wo Cm  02/17/2015   CLINICAL DATA:  Stroke with cerebral ischemia. Difficulty speaking. Recent diagnosis of colon cancer.  EXAM: CT ANGIOGRAPHY HEAD AND NECK  TECHNIQUE: Multidetector CT imaging of the head and neck was performed using the standard protocol during bolus administration of intravenous contrast. Multiplanar CT image reconstructions and MIPs were obtained to evaluate the vascular anatomy. Carotid stenosis measurements (when applicable) are obtained utilizing NASCET criteria, using the distal internal carotid diameter as the denominator.  CONTRAST:  50m OMNIPAQUE IOHEXOL 350 MG/ML SOLN  COMPARISON:  MRI of the brain 02/17/2015.  FINDINGS: CT HEAD  Brain: The acute left thalamic infarct is less well seen by CT. Mild periventricular white matter changes are again noted bilaterally. No acute cortical infarct or hemorrhage is present. The ventricles are stable. No significant extra-axial fluid collection is present. Acute and remote lacunar infarcts are again noted in the cerebellum.  Calvarium and skull base: Within normal limits.  Paranasal sinuses: Cleared  Orbits: Bilateral lens replacements are noted. The globes and orbits are otherwise intact.  CTA NECK  Aortic arch: Atherosclerotic calcifications are present  at the origins of the left common carotid artery and the left subclavian artery without significant stenosis. Minimal calcification is present along the undersurface of the aortic arch.  Right carotid system: Moderate tortuosity is present in the right common carotid artery without significant stenosis. Dense calcifications are present at the right carotid bifurcation. There is mild narrowing, less than 50% relative to the more distal vessel. The cervical right ICA is also tortuous without significant  stenosis.  Left carotid system: The left common carotid artery is tortuous without significant stenosis. Dense atherosclerotic calcifications are present at the carotid bifurcation without significant stenosis. There is significant tortuosity of the cervical left ICA without significant stenosis.  Vertebral arteries:A 50% stenosis is present in the right subclavian artery just beyond the bifurcation of the carotid artery. Dense calcifications are present at the origins of the vertebral arteries bilaterally. Mild to moderate stenoses are worse on the right. There is significant tortuosity of the proximal vertebral arteries bilaterally without additional significant stenoses in the cervical vertebral arteries. The left vertebral artery is slightly dominant to the right.  Skeleton: Bone windows demonstrate multilevel endplate degenerative change. There is chronic loss of height at C5-6 and C6-7. Grade 1 anterolisthesis is degenerative at C3-4 and C4-5. No focal lytic or blastic lesions are present.  Other neck: Mild dependent atelectasis is present at the lung apices. No significant cervical adenopathy is present. Salivary glands are within normal limits. The thyroid is unremarkable.  CTA HEAD  Anterior circulation: There is mild ectasia of the internal carotid arteries bilaterally from the high cervical segments through the ICA termini. The A1 and M1 segments are within normal limits. The anterior communicating artery is patent. MCA bifurcations are intact bilaterally. There is some attenuation of distal MCA branch vessels bilaterally without significant proximal stenosis or occlusion.  Posterior circulation: The left vertebral artery is the dominant vessel. PICA origins are visualized and within normal limits. The basilar artery is within normal limits. Both posterior cerebral arteries are of fetal type with small contribution from the P1 segments.  Venous sinuses: The dural sinuses are patent. The straight sinus and  deep cerebral veins are patent.  Anatomic variants: Bilateral fetal type posterior cerebral arteries with small P1 segments.  Delayed phase: No pathologic enhancement is present.  IMPRESSION: 1. Acute/subacute infarcts of the left thalamus and right cerebellum. 2. Moderate stenoses at the origins of the vertebral arteries bilaterally, worse on the right. 3. Tortuosity of the vertebral arteries without other focal stenoses. 4. The posterior cerebral arteries are of fetal type with a small P1 segments. 5. Marked tortuosity of the common and internal carotid arteries bilaterally without significant stenoses. 6. Dense atherosclerotic calcifications at the carotid bifurcations bilaterally without significant stenosis. 7. Multilevel spondylosis of the cervical spine.   Electronically Signed   By: San Morelle M.D.   On: 02/17/2015 18:34   Dg Chest 1 View  02/17/2015   CLINICAL DATA:  Delirium and weakness. 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 History of colon carcinoma.  EXAM: CHEST  1 VIEW  COMPARISON:  12/28/2010  FINDINGS: Cardiac silhouette is mildly enlarged. No mediastinal or hilar masses or evidence of adenopathy.  Lungs are clear.  No pleural effusion or pneumothorax.  Bony thorax is demineralized but grossly intact.  IMPRESSION: No active disease.   Electronically Signed   By: Lajean Manes M.D.   On: 02/17/2015 10:41   Ct Head Wo  Contrast  02/17/2015   CLINICAL DATA:  New onset confusion tonight with right facial droop and blurred vision right eye. Recent diagnosis of colon cancer.  EXAM: CT HEAD WITHOUT CONTRAST  TECHNIQUE: Contiguous axial images were obtained from the base of the skull through the vertex without intravenous contrast.  COMPARISON:  02/23/2005  FINDINGS: Ventricles, cisterns and other CSF spaces are within normal. Mild bilateral basal ganglia calcifications are present. There is no mass, mass  effect, shift of midline structures or acute hemorrhage. There is no evidence to suggest acute infarction. Remaining bones and soft tissues are within normal.  IMPRESSION: No acute intracranial findings.   Electronically Signed   By: Marin Olp M.D.   On: 02/17/2015 00:00   Ct Angio Neck W/cm &/or Wo/cm  02/17/2015   CLINICAL DATA:  Stroke with cerebral ischemia. Difficulty speaking. Recent diagnosis of colon cancer.  EXAM: CT ANGIOGRAPHY HEAD AND NECK  TECHNIQUE: Multidetector CT imaging of the head and neck was performed using the standard protocol during bolus administration of intravenous contrast. Multiplanar CT image reconstructions and MIPs were obtained to evaluate the vascular anatomy. Carotid stenosis measurements (when applicable) are obtained utilizing NASCET criteria, using the distal internal carotid diameter as the denominator.  CONTRAST:  16m OMNIPAQUE IOHEXOL 350 MG/ML SOLN  COMPARISON:  MRI of the brain 02/17/2015.  FINDINGS: CT HEAD  Brain: The acute left thalamic infarct is less well seen by CT. Mild periventricular white matter changes are again noted bilaterally. No acute cortical infarct or hemorrhage is present. The ventricles are stable. No significant extra-axial fluid collection is present. Acute and remote lacunar infarcts are again noted in the cerebellum.  Calvarium and skull base: Within normal limits.  Paranasal sinuses: Cleared  Orbits: Bilateral lens replacements are noted. The globes and orbits are otherwise intact.  CTA NECK  Aortic arch: Atherosclerotic calcifications are present at the origins of the left common carotid artery and the left subclavian artery without significant stenosis. Minimal calcification is present along the undersurface of the aortic arch.  Right carotid system: Moderate tortuosity is present in the right common carotid artery without significant stenosis. Dense calcifications are present at the right carotid bifurcation. There is mild narrowing,  less than 50% relative to the more distal vessel. The cervical right ICA is also tortuous without significant stenosis.  Left carotid system: The left common carotid artery is tortuous without significant stenosis. Dense atherosclerotic calcifications are present at the carotid bifurcation without significant stenosis. There is significant tortuosity of the cervical left ICA without significant stenosis.  Vertebral arteries:A 50% stenosis is present in the right subclavian artery just beyond the bifurcation of the carotid artery. Dense calcifications are present at the origins of the vertebral arteries bilaterally. Mild to moderate stenoses are worse on the right. There is significant tortuosity of the proximal vertebral arteries bilaterally without additional significant stenoses in the cervical vertebral arteries. The left vertebral artery is slightly dominant to the right.  Skeleton: Bone windows demonstrate multilevel endplate degenerative change. There is chronic loss of height at C5-6 and C6-7. Grade 1 anterolisthesis is degenerative at C3-4 and C4-5. No focal lytic or blastic lesions are present.  Other neck: Mild dependent atelectasis is present at the lung apices. No significant cervical adenopathy is present. Salivary glands are within normal limits. The thyroid is unremarkable.  CTA HEAD  Anterior circulation: There is mild ectasia of the internal carotid arteries bilaterally from the high cervical segments through the ICA termini. The A1 and M1  segments are within normal limits. The anterior communicating artery is patent. MCA bifurcations are intact bilaterally. There is some attenuation of distal MCA branch vessels bilaterally without significant proximal stenosis or occlusion.  Posterior circulation: The left vertebral artery is the dominant vessel. PICA origins are visualized and within normal limits. The basilar artery is within normal limits. Both posterior cerebral arteries are of fetal type with  small contribution from the P1 segments.  Venous sinuses: The dural sinuses are patent. The straight sinus and deep cerebral veins are patent.  Anatomic variants: Bilateral fetal type posterior cerebral arteries with small P1 segments.  Delayed phase: No pathologic enhancement is present.  IMPRESSION: 1. Acute/subacute infarcts of the left thalamus and right cerebellum. 2. Moderate stenoses at the origins of the vertebral arteries bilaterally, worse on the right. 3. Tortuosity of the vertebral arteries without other focal stenoses. 4. The posterior cerebral arteries are of fetal type with a small P1 segments. 5. Marked tortuosity of the common and internal carotid arteries bilaterally without significant stenoses. 6. Dense atherosclerotic calcifications at the carotid bifurcations bilaterally without significant stenosis. 7. Multilevel spondylosis of the cervical spine.   Electronically Signed   By: San Morelle M.D.   On: 02/17/2015 18:34   Mr Brain Wo Contrast  02/17/2015   CLINICAL DATA:  Confusion with slurred speech and difficulty with word finding. Blurred vision. Symptoms began 02/16/2015.  EXAM: MRI HEAD WITHOUT CONTRAST  TECHNIQUE: Multiplanar, multiecho pulse sequences of the brain and surrounding structures were obtained without intravenous contrast.  COMPARISON:  CT head 02/16/2015.  FINDINGS: Multifocal areas of restricted diffusion representing acute infarction are observed. These are located in multiple vascular territories. Subcentimeter BILATERAL cerebellar cortical and subcortical infarcts are seen. Linear LEFT paramedian acute pontine infarct. Scattered medial LEFT parietal and lateral LEFT occipital cortical and subcortical acute infarcts. Largest area of infarction affects the LEFT thalamus, 10 x 14 mm. All of these acute infarcts lie within the distribution of the BILATERAL vertebral arteries and basilar artery, and felt to represent posterior circulation event.  No acute or chronic  hemorrhage, mass lesion, hydrocephalus, or extra-axial fluid.  Generalized atrophy. Mild subcortical and periventricular T2 and FLAIR hyperintensities, likely chronic microvascular ischemic change. Flow voids are maintained in the carotid, basilar, and both distal vertebral arteries.  BILATERAL cataract extraction. Chronic sinus disease. No mastoid fluid. No osseous findings. No midline abnormality. Compared with prior CT, the infarcts are not visible on that study.  IMPRESSION: Multifocal areas of acute infarction affecting the cerebellum, LEFT paramedian pons, LEFT thalamus, and LEFT posterior parietal and occipital regions. Posterior circulation event is suspected.  Generalized atrophy and mild small vessel disease, not unexpected for age.  These results will be called to the ordering clinician or representative by the Radiologist Assistant, and communication documented in the PACS or zVision Dashboard.   Electronically Signed   By: Staci Righter M.D.   On: 02/17/2015 11:01    Scheduled Meds: . aspirin  325 mg Oral Daily  . ciprofloxacin  400 mg Intravenous Q12H  . cycloSPORINE  1 drop Both Eyes BID  . docusate sodium  200 mg Oral QHS  . enoxaparin (LOVENOX) injection  40 mg Subcutaneous Q24H  . feeding supplement  1 Container Oral TID BM  . metronidazole  500 mg Intravenous Q8H  . montelukast  10 mg Oral QHS  . saccharomyces boulardii  250 mg Oral BID   Continuous Infusions: . sodium chloride 75 mL/hr at 02/17/15 1856    Principal  Problem:   Colonic mass Active Problems:   Hypertension   Osteopenia   Leukocytosis   Anemia of chronic disease   CVA (cerebral infarction)    Time spent: 20 min Verneita Griffes, MD [facility] [phone]

## 2015-02-18 NOTE — Progress Notes (Signed)
STROKE TEAM PROGRESS NOTE  HPI  Katherine Cooke is an 81 y.o. female with a past medical history significant for HTN, basal cell carcinoma, spinal stenosis, admitted to [facility] for evaluation of left lower quadrant abdominal pain of one week duration with preliminary biopsy results concerning for adenocarcinoma, who overnight developed mental status changes and had CT scan of brain that did not show acute intracranial abnormalities. During earlier evaluation today was noted to have right sided weakness, slurred speech, as well as right face droopiness that prompted MRI brain which I personally reviewed and showed acute areas of ischemic infarctions involving bilateral cerebellar hemispheres, left paramedian pons, as well as scattered medial LEFT parietal and lateral LEFT occipital cortical and subcortical acute infarcts. The largest area of infarction affects the LEFT thalamus. Patient family is at the bedside and expressed their concern and unhappiness with the manner who her acute symptoms were handled last night. Her son said that he noticed that patient had slurred speech and right face droopiness last night but they have to insist to get the CT brain done, and afterwards they were assure patient did not have a stroke. He said that he is getting discordant information from the physicians regarding what had occurred since last night, including why patient was not administer tpa. I expressed to the family that this is my first encounter with the patient and I was not aware of such developmts last night. In any case, patient said that she is uncomfortable due to abdominal pain, but denies HA, vertigo, double vision, difficulty swallowing, or visual loss.   Date last known well: 02/17/15 Time last known well: 5 pm tPA Given: no, code stroke was not activated when patient had change in neuro status, initial symptoms thought to be medication related, patient assessment by primary team today with more definitve focal  findings.   SUBJECTIVE (INTERVAL HISTORY) The patient's 3 sons are at the bedside. Dr. Erlinda Hong had a long talk with the patient and her sons regarding stroke management as well as the possible embolic etiologies for the patient's stroke. Dr. Erlinda Hong also discussed with Dr. Earlie Server over the phone and they both agree on CT chest and lovenox for hypercoagulable state. Sons were also in agreement with the plan.    OBJECTIVE Temp:  [97.5 F (36.4 C)-99 F (37.2 C)] 98.2 F (36.8 C) (09/15 0400) Pulse Rate:  [78-89] 88 (09/15 0400) Cardiac Rhythm:  [-] Normal sinus rhythm (09/14 1924) Resp:  [14-21] 14 (09/15 0400) BP: (124-149)/(48-68) 148/68 mmHg (09/15 0400) SpO2:  [93 %-97 %] 95 % (09/15 0400)  CBC:  Recent Labs Lab 02/16/15 0421 02/17/15 0413  WBC 10.6* 12.2*  HGB 9.4* 9.4*  HCT 27.9* 28.2*  MCV 98.2 98.3  PLT 397 404*    Basic Metabolic Panel:  Recent Labs Lab 02/14/15 0512  02/16/15 0421 02/17/15 0413  NA 132*  < > 133* 136  K 3.6  < > 3.7 3.5  CL 100*  < > 103 106  CO2 26  < > 24 23  GLUCOSE 124*  < > 97 96  BUN 20  < > 7 <5*  CREATININE 0.95  < > 0.72 0.60  CALCIUM 8.1*  < > 8.0* 8.0*  MG 2.1  --   --   --   PHOS 3.6  --   --   --   < > = values in this interval not displayed.  Lipid Panel: No results found for: CHOL, TRIG, HDL, CHOLHDL, VLDL, LDLCALC  HgbA1c: No results found for: HGBA1C Urine Drug Screen: No results found for: LABOPIA, COCAINSCRNUR, LABBENZ, AMPHETMU, THCU, LABBARB    IMAGING  I have personally reviewed the radiological images below and agree with the radiology interpretations.  Ct Angio Head and Neck W/cm &/or Wo Cm 02/17/2015    1. Acute/subacute infarcts of the left thalamus and right cerebellum.  2. Moderate stenoses at the origins of the vertebral arteries bilaterally, worse on the right.  3. Tortuosity of the vertebral arteries without other focal stenoses.  4. The posterior cerebral arteries are of fetal type with a small P1 segments.   5. Marked tortuosity of the common and internal carotid arteries bilaterally without significant stenoses.  6. Dense atherosclerotic calcifications at the carotid bifurcations bilaterally without significant stenosis.  7. Multilevel spondylosis of the cervical spine.     Dg Chest 1 View 02/17/2015    No active disease.     Ct Head Wo Contrast  02/17/2015    No acute intracranial findings.   Mr Brain Wo Contrast 02/17/2015    Multifocal areas of acute infarction affecting the cerebellum, LEFT paramedian pons, LEFT thalamus, and LEFT posterior parietal and occipital regions. Posterior circulation event is suspected.  Generalized atrophy and mild small vessel disease, not unexpected for age.    CT abdomen/pelvis - 1. Masslike soft tissue involving the sigmoid colon, most concerning for primary colonic malignancy with necrotic component or less likely abscess. While there is extensive left-sided colonic diverticulosis, this appears more complex and masslike than typical perforated diverticulitis and abscess formation. 2. Small pelvic and retroperitoneal lymph nodes, indeterminate but could represent nodal metastatic disease. 3. 1.4 cm hypoattenuating liver lesion, indeterminate although a metastasis is not excluded. 4. Tiny right lower lobe lung nodules measuring 2-3 mm in size.  Colon biopsy - There are clusters of poorly differentiated malignant cells. The cells are located beneath benign appearing colonic mucosa raising concern for a non-colon primary. Immunohistochemistry reveals the cells are positive for cytokeratin 7 (diffuse strong), TTF-1 (diffuse strong), and CD56 (diffuse strong), p53, and WT-1 (focal). There is only very weak focal staining with ER. The cells are negative for NapsinA, cytokeratin 20, CDX-2, chromogranin, synaptophysin, calretinin, and calcitonin. The strong cytokeratin 7 and TTF-1 staining raises the possibility of a lung or thyroid primary.  Clinical correlation is recommended. Dr. Saralyn Pilar has reviewed the case. The case was called to Dr. Cristina Gong on 02/16/2015 and Dr. Julien Nordmann on 02/18/2015. Per request tissue will be sent for [facility].   PHYSICAL EXAM  Temp:  [97.5 F (36.4 C)-99 F (37.2 C)] 97.6 F (36.4 C) (09/15 1333) Pulse Rate:  [79-89] 89 (09/15 1333) Resp:  [14-21] 20 (09/15 1333) BP: (127-149)/(57-68) 133/58 mmHg (09/15 1333) SpO2:  [93 %-98 %] 98 % (09/15 1333)  General - Well nourished, well developed, in no apparent distress.  Ophthalmologic - Fundi not visualized due to small pupils and left ptosis.  Cardiovascular - Regular rate and rhythm with no murmur.  Mental Status -  Level of arousal and orientation to time, place, and person were intact. Language intact at reading, comprehension, repetition. On naming, pt named 4/6. On expression, pt does have paraphasic errors and frequent hesitation as well as mild non-fluency. Fund of Knowledge was assessed and was impaired.  Cranial Nerves II - XII - II - Visual field intact OU. III, IV, VI - Extraocular movements intact, no diplopia, but left ptosis, PERRL. V - Facial sensation intact bilaterally. VII - Facial movement showed right facial  droop. VIII - Hearing & vestibular intact bilaterally. X - Palate elevates symmetrically. XI - Chin turning & shoulder shrug intact bilaterally. XII - Tongue protrusion intact.  Motor Strength - The patient's strength was 4+/5 in all extremities and pronator drift was absent.  Bulk was normal and fasciculations were absent.   Motor Tone - Muscle tone was assessed at the neck and appendages and was normal.  Reflexes - The patient's reflexes were 1+ in all extremities and she had no pathological reflexes.  Sensory - Light touch, temperature/pinprick were assessed and were symmetrical.    Coordination - The patient had normal movements in the hands with no ataxia or dysmetria.  Tremor was absent.  Gait and Station  - deferred due to fatigue.   ASSESSMENT/PLAN Ms. LAYNA ROEPER is a 81 y.o. female with history of HTN, basal cell carcinoma, spinal stenosis, left lower quadrant abdominal pain of one week duration with preliminary biopsy results concerning for adenocarcinoma, presenting with left ptosis, right facial droop, and slurred speech. She did not receive IV t-PA due to out of window and initial deficits felt to be medication related.  Strokes:  embolic infarcts involving b/l cerebellar, pons, left thalamus, and left MCA/PCA territory, possibly secondary to hypercoagulable state from advance malignancy.  Resultant  - mild facial droop and aphasia, left ptosis  MRI - embolic infarcts involving b/l cerebellar, pons, left thalamus, and left MCA/PCA territory.  CTA head and neck - unremarkable   Lower extremity Dopplers negative for DVT  2D Echo - pending  LDL - 63  HgbA1c pending  VTE prophylaxis - Lovenox  Diet clear liquid Room service appropriate?: Yes; Fluid consistency:: Thin  aspirin 81 mg orally every day prior to admission, now on aspirin 325 mg orally every day. Due to the concern for malignancy related hypercoagulable state, recommend lovenox therapeutic dose for anticoagulation. Pt infarcts are small in size, low risk for developing hemorrhagic transformation, Ok to start today.   Patient counseled to be compliant with her antithrombotic medications  Ongoing aggressive stroke risk factor management  Therapy recommendations: CIR recommended  Disposition:  Pending  Advanced malignancy  Newly diagnosed colon cancer  CT abdomen/pelvis showed likely liver metastasis  Colon biopsy concerning for colon lesion being secondary and primary may be lung or thyroid  Discussed with Dr. Inda Merlin and will do CT chest with contrast  Agree with lovenox for hypercoagulable state  Regarding surgical intervention, would like to recommend to hold off urgent procedure for 2 weeks, and  elective procedure for 6 months. Avoid hypotension peri-operative periods.    Hypertension  Stable  Permissive hypertension (OK if < 220/120) but gradually normalize in 5-7 days  Other Stroke Risk Factors  Advanced age  ETOH use  Other [facility] day # [place] PA-C [facility] Pager [phone] 02/18/2015, 10:10 AM  I, the attending vascular neurologist, have personally obtained a history, examined the patient, evaluated laboratory data, individually viewed imaging studies and agree with radiology interpretations. I obtained additional history from pt's sons at bedside. I also discussed with sons and Dr. Verlon Au as well as Dr. Earlie Server regarding her care plan. Together with the NP/PA, we formulated the assessment and plan of care which reflects our mutual decision.  I have made any additions or clarifications directly to the above note and agree with the findings and plan as currently documented.   81 yo F admitted for abdominal pain found to have colon cancer. Unfortunately  developed embolic stroke likely due to advance malignancy. She has liver lesion concerning for metastasis and colon lesion likely to be secondary as per biopsy. Will need CT chest for further evaluation. Discussed with Dr. Earlie Server, agree to put on lovenox for hypercoagulable state management. Infarcts are small size and low risk for hemorrhagic transformation.  Discussed with sons at bedside for about one hour. Answered their questions to their satisfaction. Basically, they have two questions, one was whether tPA should be used and the other was whether MRI showed be done earlier. I further obtained detailed history that pt was LSN 5:30pm. Pt talked with her aunt via phone at 8:30pm and aunt noticed change in speech but did not tell nurses or call for medical staff. On nurse checking at 10:30pm found she had change of neuro status, son notified and they came to hospital quickly  and was further evaluated by CT around 11:30pm. At that time, pt had been out of tPA window and her NIHSS was relatively low according to notes. Therefore, she was not a candidate for tPA. For their second question, earlier MRI certainly will get earlier diagnosis but in this case probably not alter the management. Therefore, it is not necessary to have early MRI done. I also showed pt and her sons MRI images and discussed with them further work up about etiology of stroke. They seem to be comfortable with my explanation and expressed understanding and appreciation.   Rosalin Hawking, MD PhD Stroke Neurology 02/18/2015 4:44 PM    To contact Stroke Continuity provider, please refer to [email or web address] After hours, contact General Neurology

## 2015-02-18 NOTE — Evaluation (Signed)
Speech Language Pathology Evaluation Patient Details Name: Katherine Cooke MRN: [id number] DOB: [birth year] Today's Date: 02/18/2015 Time: 7829-5621 SLP Time Calculation (min) (ACUTE ONLY): 40 min  Problem List:  Patient Active Problem List   Diagnosis Date Noted  . CVA (cerebral infarction) 02/17/2015  . Anemia of chronic disease 02/15/2015  . Leukocytosis   . Colonic mass 02/13/2015  . Osteopenia 01/13/2015  . Hypertension   . Basal cell carcinoma of nose   . Squamous cell carcinoma of left hip    Past Medical History:  Past Medical History  Diagnosis Date  . Hypertension   . Spinal stenosis   . Basal cell carcinoma of nose 2005  . Squamous cell carcinoma of left hip 2005  . Colon cancer dx'd 02/16/15  . Diabetes mellitus without complication    Past Surgical History:  Past Surgical History  Procedure Laterality Date  . Breast biopsy  1950's ; 1952 ; 1954    x 3 - Benign  . Tonsillectomy    . Flexible sigmoidoscopy N/A 02/15/2015    Procedure: FLEXIBLE SIGMOIDOSCOPY;  Surgeon: Ronald Lobo, MD;  Location: [facility] ENDOSCOPY;  Service: Endoscopy;  Laterality: N/A;   HPI:  81 yo female adm to [facility] with colonic mass.  Pt found to have expressive aphasia, facial droop, vision changes, speech changes with concern for possible CVA.  Pt cva confirmed - per MRI -.  Multifocal areas of acute infarction affecting the cerebellum, LEFT paramedian pons, LEFT thalamus, and LEFT posterior parietal and occipital lobe.      Assessment / Plan / Recommendation Clinical Impression  Pt presents with a primarily expressive aphasia with impaired word retrieval, presence of semantic and phonemic paraphasias, good self recognition and emerging ability to repair communication.  Receptive language is marked by good ability to follow one step commands, with deficits noted for three-step commands.  Yes/no reliability for factual information is 70%.  Pt with much improved communication since yesterday, according  to her children.  Educated pt and family re: nature of aphasia and trajectory of recovery.  Recommend CIR for rehab.  Will follow acutely.     SLP Assessment  Patient needs continued Speech Lanaguage Pathology Services    Follow Up Recommendations  Inpatient Rehab    Frequency and Duration min 3x week  2 weeks   Pertinent Vitals/Pain Pain Assessment: Faces Faces Pain Scale: Hurts a little bit Pain Location: back Pain Descriptors / Indicators: Grimacing Pain Intervention(s): Limited activity within patient's tolerance;Monitored during session   SLP Goals  Potential to Achieve Goals (ACUTE ONLY): Good  SLP Evaluation Prior Functioning  Cognitive/Linguistic Baseline: Within functional limits Type of Home: Independent living facility  Lives With: Alone Available Help at Discharge: Family   Cognition  Overall Cognitive Status: Difficult to assess Orientation Level: Oriented to person;Oriented to place;Disoriented to time;Disoriented to situation Attention: Sustained Sustained Attention: Appears intact Awareness: Appears intact    Comprehension  Auditory Comprehension Overall Auditory Comprehension: Impaired Yes/No Questions: Impaired Basic Immediate Environment Questions: 75-100% accurate Other Yes/No Questions Comments`: Moderate Commands: Impaired One Step Basic Commands: 75-100% accurate Multistep Basic Commands: 50-74% accurate Conversation: Simple Visual Recognition/Discrimination Discrimination: Exceptions to [employer] Drawings:  (80% accuracy) Reading Comprehension Reading Status:  (read simple sentences aloud )    Expression Expression Primary Mode of Expression: Verbal Verbal Expression Overall Verbal Expression: Impaired Initiation: No impairment Level of Generative/Spontaneous Verbalization: Sentence Repetition: Impaired Level of Impairment: Sentence level Naming: Impairment Responsive: 51-75% accurate Confrontation: Impaired Black/White  Line Drawings:  (  80% accuracy) Convergent: 50-74% accurate Verbal Errors: Semantic paraphasias;Phonemic paraphasias Pragmatics: No impairment Written Expression Dominant Hand: Right Written Expression: Not tested   Oral / Motor Oral Motor/Sensory Function Overall Oral Motor/Sensory Function: Impaired (see bedside swallow eval) Motor Speech Overall Motor Speech: Impaired Respiration: Within functional limits Phonation: Normal Resonance: Within functional limits Articulation: Impaired Level of Impairment: Sentence Intelligibility: Intelligibility reduced Word: 75-100% accurate Conversation: 75-100% accurate   GO     [name] 02/18/2015, 3:50 PM

## 2015-02-18 NOTE — Progress Notes (Signed)
ANTIBIOTIC CONSULT NOTE - INITIAL  Pharmacy Consult for Zosyn Indication: Intra-abdominal infection  Allergies  Allergen Reactions  . Lyrica [Pregabalin] Shortness Of Breath    Patient Measurements: Height: '4\' 11"'$  (149.9 cm) Weight: 103 lb (46.72 kg) IBW/kg (Calculated) : 43.2  Vital Signs: Temp: 98.2 F (36.8 C) (09/15 0400) Temp Source: Axillary (09/15 0400) BP: 148/68 mmHg (09/15 0400) Pulse Rate: 88 (09/15 0400) Intake/Output from previous day: 09/14 0701 - 09/15 0700 In: 575 [P.O.:240; I.V.:235; IV Piggyback:100] Out: -  Intake/Output from this shift:    Labs:  Recent Labs  02/16/15 0421 02/17/15 0413  WBC 10.6* 12.2*  HGB 9.4* 9.4*  PLT 397 404*  CREATININE 0.72 0.60   Estimated Creatinine Clearance: 33.8 mL/min (by C-G formula based on Cr of 0.6). No results for input(s): VANCOTROUGH, VANCOPEAK, VANCORANDOM, GENTTROUGH, GENTPEAK, GENTRANDOM, TOBRATROUGH, TOBRAPEAK, TOBRARND, AMIKACINPEAK, AMIKACINTROU, AMIKACIN in the last 72 hours.   Microbiology: No results found for this or any previous visit (from the past 720 hour(s)).  Medical History: Past Medical History  Diagnosis Date  . Hypertension   . Spinal stenosis   . Basal cell carcinoma of nose 2005  . Squamous cell carcinoma of left hip 2005  . Colon cancer dx'd 02/16/15  . Diabetes mellitus without complication     Assessment: 81 yo F presents on 9/11 with lower abdominal pain. PMH includes basal cell carcinoma of nose, squamous cell carcinoma of hip, HTN, and spinal stenosis. CT scan showed masslike soft tissue involving sigmoid colon that was concerning for malignancy. On 9/12 she underwent sigmoidoscopy and pathology reports showed adenocarcinoma. On 9/13 she had AMS and R sided facial droop. MRI of the brain revealed multifocal areas of acute infarction. She was on cipro and flagyl for concerns of necrosis and infection in the abdomen and now pharmacy to switch to Zosyn. Pt is afebrile, WBC  slightly elevated at 12.2. SCr stable, CrCl ~72m/min.  Goal of Therapy:  Resolution of infection  Plan:  Give Zosyn 3.375g IV (30 min infusion) x 1, then start Zosyn 3.375 gm IV q8h (4 hour infusion) Monitor clinical picture, renal function F/U C&S, abx deescalation / LOT  NElenor Quinones PharmD Clinical Pharmacist Pager [phone]/15/2016 8:35 AM

## 2015-02-18 NOTE — Progress Notes (Signed)
Physical Therapy Treatment Patient Details Name: Katherine Cooke MRN: [id number] DOB: 04/12/1926 Today's Date: 02/18/2015    History of Present Illness Katherine Cooke is a 81 y.o. 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 She says that she has felt a lump in the left lower quadrant for about 5 years, and during this time has had alternating periods of diarrhea and constipation.  Developed slurred speech and facial droop, then noted to have R side weakness, L medial rectus palsy and L ptosis.   CT, negative, MRI positive for L thalamic infarct, bilat cerebellar infarcts, L pons, L medial parietal and lateral occipital infarcts (acute and sub acute).    PT Comments    Patient progressing able to walk in hall with walker, one assist and chair following.  Limited activity tolerance throughout.  Will need further skilled PT to maximize safety and independence prior to d/c home.  Follow Up Recommendations  CIR;Supervision/Assistance - 24 hour     Equipment Recommendations  Other (comment) (TBA)    Recommendations for Other Services       Precautions / Restrictions Precautions Precautions: Fall Precaution Comments: slight R hemiplegia, R lateral lean Restrictions Weight Bearing Restrictions: No    Mobility  Bed Mobility Overal bed mobility: Needs Assistance Bed Mobility: Supine to Sit     Supine to sit: HOB elevated;Min guard     General bed mobility comments: min facilitation for scooting forward to EOB with HOB elevated and pt using rail to assist wtih mobility  Transfers Overall transfer level: Needs assistance Equipment used: Rolling walker (2 wheeled) Transfers: Sit to/from [facility] Sit to Stand: Min assist;Mod assist Stand pivot transfers: Min  assist Squat pivot transfers: Min assist;Mod assist     General transfer comment: pivot to [facility] cues to reach for opposite rail and min/mod support for balance/lifting.;  stood from bed/BSC assist for balance/cues for foot placement  Ambulation/Gait Ambulation/Gait assistance: Mod assist;+2 safety/equipment (chair following) Ambulation Distance (Feet): 30 Feet Assistive device: Rolling walker (2 wheeled) Gait Pattern/deviations: Step-through pattern;Decreased stride length;Decreased step length - right;Decreased dorsiflexion - right;Trunk flexed     General Gait Details: cues for posture, lifting right LE for clearance, for eyes ahead.  assist for balance, walker safety/progression   Stairs            Wheelchair Mobility    Modified Rankin (Stroke Patients Only) Modified Rankin (Stroke Patients Only) Pre-Morbid Rankin Score: No symptoms Modified Rankin: Moderately severe disability     Balance Overall balance assessment: Needs assistance Sitting-balance support: No upper extremity supported Sitting balance-Leahy Scale: Fair Sitting balance - Comments: no LOB with LB dressing in sitting   Standing balance support: Bilateral upper extremity supported Standing balance-Leahy Scale: Poor Standing balance comment: UE support and min assist for balance                    Cognition Arousal/Alertness: Awake/alert Behavior During Therapy: WFL for tasks assessed/performed Overall Cognitive Status: Within Functional Limits for tasks assessed     Current Attention Level: Selective   Following Commands: Follows multi-step commands inconsistently       General Comments: some degree of expressive aphasia noted, but patient seems to be working for word finding and slowing speech as well as overpronouncing to compensate; seems aware of  deficits today as much as I can tell.    Exercises      General Comments General comments (skin integrity, edema, etc.): Patient asked  to go to bathroom and needed assist for hygiene after using BSC.  Reports vision is not double, but unlevel and "it bothers her so been closing right eye".      Pertinent Vitals/Pain Pain Assessment: No/denies pain Faces Pain Scale: Hurts a little bit Pain Location: abdominal cramping due to neeing to use the bathroom Pain Descriptors / Indicators: Aching Pain Intervention(s): Other (comment) (assisted to [facility] [place])    Home Living Family/patient expects to be discharged to:: Private residence Living Arrangements: Alone Available Help at Discharge: Family Type of Home: Independent living facility Home Access: Level entry   [place]: [address] [place]: Grab bars - tub/shower;Grab bars - toilet Additional Comments: Pt lives in [place] at [place].    Prior Function Level of Independence: Independent      Comments: Pt living on her own, ambulating to/from dining hall, driving.   PT Goals (current goals can now be found in the care plan section) Acute Rehab PT Goals Patient Stated Goal: n/a, per family to regain independence Progress towards PT goals: Progressing toward goals    Frequency  Min 4X/week    PT Plan Current plan remains appropriate;Frequency needs to be updated    Co-evaluation             End of Session Equipment Utilized During Treatment: Gait belt Activity Tolerance: Patient limited by fatigue Patient left: in chair;with call bell/phone within reach;with family/visitor present     Time: 5038-8828 PT Time Calculation (min) (ACUTE ONLY): 25 min  Charges:  $Gait Training: 8-22 mins $Therapeutic Activity: 8-22 mins                    G Codes:      Katherine Cooke,Katherine Cooke 18-Mar-2015, 2:52 PM  Katherine Cooke, [place] 2015/03/18

## 2015-02-18 NOTE — Progress Notes (Signed)
ANTICOAGULATION CONSULT NOTE - Initial Consult  Pharmacy Consult for Enoxaparin Indication: Hypercoag state due to metastatic cancer  Allergies  Allergen Reactions  . Lyrica [Pregabalin] Shortness Of Breath    Patient Measurements: Height: '4\' 11"'$  (149.9 cm) Weight: 103 lb (46.72 kg) IBW/kg (Calculated) : 43.2  Vital Signs: Temp: 97.6 F (36.4 C) (09/15 1333) Temp Source: Oral (09/15 1333) BP: 133/58 mmHg (09/15 1333) Pulse Rate: 89 (09/15 1333)  Labs:  Recent Labs  02/16/15 0421 02/17/15 0413 02/18/15 0718  HGB 9.4* 9.4* 10.1*  HCT 27.9* 28.2* 30.6*  PLT 397 404* 401*  CREATININE 0.72 0.60 0.69    Estimated Creatinine Clearance: 33.8 mL/min (by C-G formula based on Cr of 0.69).   Medical History: Past Medical History  Diagnosis Date  . Hypertension   . Spinal stenosis   . Basal cell carcinoma of nose 2005  . Squamous cell carcinoma of left hip 2005  . Colon cancer dx'd 02/16/15  . Diabetes mellitus without complication     Assessment: 81 yo F presents on 9/11 with abdominal pain. PMH includes basal cell carcinoma of nose, squamous cell carcinoma of hip. On 9/13 she had AMS and R sided facial droop. MRI of the brain revealed multifocal areas of acute infarction. Deemed to have hypercoagulability secondary to metastatic cancer as the probable etiology of her embolic strokes. Pharmacy consulted to dose enoxaparin for hypercoagulability and embolic stroke. SCr stable, CrCl ~10m/min. Weight is 47kg. Due to borderline renal function and borderline weight will due '1mg'$ /kg [place] Q24 dosing.  Goal of Therapy:  Monitor platelets by anticoagulation protocol: Yes   Plan:  Give enoxaparin '45mg'$  [place] Q24 Monitor CBC, s/s of bleed     Jamesyn [place]field J 02/18/2015,2:00 PM

## 2015-02-19 ENCOUNTER — Encounter (HOSPITAL_COMMUNITY)
Admission: EM | Disposition: A | Discharge status: Rehabilitation Facility | Payer: Self-pay | Source: Home / Self Care | Attending: Family Medicine

## 2015-02-19 ENCOUNTER — Encounter (HOSPITAL_COMMUNITY): Payer: Self-pay | Admitting: Physical Medicine and Rehabilitation

## 2015-02-19 ENCOUNTER — Inpatient Hospital Stay (HOSPITAL_COMMUNITY): Payer: Medicare Other

## 2015-02-19 DIAGNOSIS — K6389 Other specified diseases of intestine: Secondary | ICD-10-CM

## 2015-02-19 DIAGNOSIS — I6340 Cerebral infarction due to embolism of unspecified cerebral artery: Secondary | ICD-10-CM

## 2015-02-19 DIAGNOSIS — I69320 Aphasia following cerebral infarction: Secondary | ICD-10-CM

## 2015-02-19 DIAGNOSIS — G8190 Hemiplegia, unspecified affecting unspecified side: Secondary | ICD-10-CM

## 2015-02-19 DIAGNOSIS — I10 Essential (primary) hypertension: Secondary | ICD-10-CM

## 2015-02-19 DIAGNOSIS — D638 Anemia in other chronic diseases classified elsewhere: Secondary | ICD-10-CM

## 2015-02-19 DIAGNOSIS — I69393 Ataxia following cerebral infarction: Secondary | ICD-10-CM

## 2015-02-19 LAB — CBC
HEMATOCRIT: 33.1 % — AB (ref 36.0–46.0)
HEMOGLOBIN: 11.3 g/dL — AB (ref 12.0–15.0)
MCH: 33.1 pg (ref 26.0–34.0)
MCHC: 34.1 g/dL (ref 30.0–36.0)
MCV: 97.1 fL (ref 78.0–100.0)
Platelets: 432 10*3/uL — ABNORMAL HIGH (ref 150–400)
RBC: 3.41 MIL/uL — ABNORMAL LOW (ref 3.87–5.11)
RDW: 12.9 % (ref 11.5–15.5)
WBC: 12.7 10*3/uL — ABNORMAL HIGH (ref 4.0–10.5)

## 2015-02-19 SURGERY — ECHOCARDIOGRAM, TRANSESOPHAGEAL
Anesthesia: Moderate Sedation

## 2015-02-19 MED ORDER — POTASSIUM CHLORIDE CRYS ER 20 MEQ PO TBCR
40.0000 meq | EXTENDED_RELEASE_TABLET | Freq: Every day | ORAL | Status: DC
  Administered 2015-02-19 – 2015-02-22 (×4): 40 meq via ORAL
  Filled 2015-02-19 (×4): qty 2

## 2015-02-19 MED ORDER — ENSURE ENLIVE PO LIQD
237.0000 mL | Freq: Two times a day (BID) | ORAL | Status: DC
  Administered 2015-02-19 – 2015-02-22 (×6): 237 mL via ORAL
  Filled 2015-02-19 (×9): qty 237

## 2015-02-19 MED ORDER — POLYETHYLENE GLYCOL 3350 17 G PO PACK
17.0000 g | PACK | Freq: Every day | ORAL | Status: DC
  Administered 2015-02-19 – 2015-02-22 (×4): 17 g via ORAL
  Filled 2015-02-19 (×4): qty 1

## 2015-02-19 MED ORDER — POLYVINYL ALCOHOL 1.4 % OP SOLN
1.0000 [drp] | OPHTHALMIC | Status: DC | PRN
  Administered 2015-02-19 – 2015-02-20 (×2): 1 [drp] via OPHTHALMIC
  Filled 2015-02-19: qty 15

## 2015-02-19 MED ORDER — IOHEXOL 300 MG/ML  SOLN
75.0000 mL | Freq: Once | INTRAMUSCULAR | Status: AC | PRN
  Administered 2015-02-19: 75 mL via INTRAVENOUS

## 2015-02-19 MED ORDER — FLUCONAZOLE 100 MG PO TABS: 200.0000 mg | ORAL_TABLET | Freq: Once | ORAL | Status: DC

## 2015-02-19 NOTE — Consult Note (Signed)
Referral MD  Reason for Referral: Colonic mass with near obstruction. Recent embolic CNS infarct   Chief Complaint  Patient presents with  . Abdominal Cramping  : No history  HPI: Ms. Katherine Cooke is an 81 year old white female. She has been in decent health. She was admitted back on September 10. She was having abdominal pain.  She ultimately had a CT scan. The CT scan showed that she had a colonic mass. She then underwent a sigmoidoscopy. Biopsies were taken. The pathologist, cannot tell us if this was a primary colon cancer. My feeling is that they do not think that this is a primary colon cancer by the studies that were done.  She had scans done. The radiologist, said that they cannot totally exclude a 1.4 cm hepatic lesion.  She had 2 markers done. Her CA 19-9 was elevated at 54. Her CEA-125 was elevated at 314. Show normal CEA of 1.8. Her she was supposed to have a resection because of the near obstructing nature of her cancer. However, she then developed a embolic infarct of the brain.  She currently is on Lovenox. It's felt that she is "hypercoagulable" because of her cancer.  She is to hopefully go to rehabilitation for a couple weeks prior to having surgical intervention.  We are asked to comment on this underlying malignancy and what else might be oh to be done for her.  I think it is clear that nutrition is going to be very key. She is quite petite. She is somewhat frail. She really needs to increase her nutritional input. I will see what her prealbumin is.  She's not hurting. She's had no nausea vomiting. She's had no bleeding.  Her prealbumin that was done 2 days ago unfortunately was quite low at 5.9.  She does have iron deficiency. Her iron studies are low.  Currently, her performance status is ECOG 2.   Past Medical History  Diagnosis Date  . Hypertension   . Spinal stenosis   . Basal cell carcinoma of nose 2005  . Squamous cell carcinoma of left hip 2005  . Colon  cancer dx'd 02/16/15  . Diabetes mellitus without complication   . Glaucoma   . Macular degeneration   :  Past Surgical History  Procedure Laterality Date  . Breast biopsy  1950's ; 1952 ; 1954    x 3 - Benign  . Tonsillectomy    . Flexible sigmoidoscopy N/A 02/15/2015    Procedure: FLEXIBLE SIGMOIDOSCOPY;  Surgeon: Ronald Lobo, MD;  Location: [facility] END[facility];  Service: Endoscopy;  Laterality: N/A;  :   Current facility-administered medications:  .  0.9 %  sodium chloride infusion, , Intravenous, Continuous, Debbe Odea, MD, Last Rate: 75 mL/hr at 02/17/15 1856 .  acetaminophen (TYLENOL) tablet 650 mg, 650 mg, Oral, Q6H PRN, Debbe Odea, MD, 650 mg at 02/19/15 0006 .  aspirin tablet 325 mg, 325 mg, Oral, Daily, Kelvin Cellar, MD, 325 mg at 02/19/15 1114 .  cycloSPORINE (RESTASIS) 0.05 % ophthalmic emulsion 1 drop, 1 drop, Both Eyes, BID, Reubin Milan, MD, 1 drop at 02/19/15 1115 .  diphenhydrAMINE (BENADRYL) 12.5 MG/5ML elixir 12.5 mg, 12.5 mg, Oral, Q8H PRN, Gardiner Barefoot, NP .  docusate sodium (COLACE) capsule 200 mg, 200 mg, Oral, QHS, Debbe Odea, MD, 200 mg at 02/18/15 2159 .  enoxaparin (LOVENOX) injection 45 mg, 1 mg/kg, Subcutaneous, Q24H, Cecilio Asper Batchelder, RPH, 45 mg at 02/19/15 1426 .  feeding supplement (ENSURE ENLIVE) (ENSURE ENLIVE) liquid 237 mL, 237  mL, Oral, BID BM, Reanne J Barbato, RD, 237 mL at 02/19/15 1615 .  montelukast (SINGULAIR) tablet 10 mg, 10 mg, Oral, QHS, Reubin Milan, MD, 10 mg at 02/18/15 2159 .  morphine 4 MG/ML injection 1 mg, 1 mg, Intravenous, Q2H PRN, Reubin Milan, MD, 1 mg at 02/16/15 0244 .  oxyCODONE (Oxy IR/ROXICODONE) immediate release tablet 5 mg, 5 mg, Oral, Q6H PRN, Debbe Odea, MD, 5 mg at 02/17/15 2053 .  polyethylene glycol (MIRALAX / GLYCOLAX) packet 17 g, 17 g, Oral, Daily, Nita Sells, MD, 17 g at 02/19/15 1615 .  potassium chloride SA (K-DUR,KLOR-CON) CR tablet 40 mEq, 40 mEq, Oral, Daily,  Nita Sells, MD, 40 mEq at 02/19/15 1113 .  saccharomyces boulardii (FLORASTOR) capsule 250 mg, 250 mg, Oral, BID, Ronald Lobo, MD, 250 mg at 02/19/15 1114:  . aspirin  325 mg Oral Daily  . cycloSPORINE  1 drop Both Eyes BID  . docusate sodium  200 mg Oral QHS  . enoxaparin (LOVENOX) injection  1 mg/kg Subcutaneous Q24H  . feeding supplement (ENSURE ENLIVE)  237 mL Oral BID BM  . montelukast  10 mg Oral QHS  . polyethylene glycol  17 g Oral Daily  . potassium chloride  40 mEq Oral Daily  . saccharomyces boulardii  250 mg Oral BID  :  Allergies  Allergen Reactions  . Lyrica [Pregabalin] Shortness Of Breath  :  Family History  Problem Relation Age of Onset  . Breast cancer Maternal Aunt   . Ovarian cancer Sister   . Heart disease Father   . Ulcers Father   . COPD Mother   :  Social History   Social History  . Marital Status: Widowed    Spouse Name: N/A  . Number of Children: N/A  . Years of Education: N/A   Occupational History  . Not on file.   Social History Main Topics  . Smoking status: Never Smoker   . Smokeless tobacco: Never Used  . Alcohol Use: 4.2 oz/week    7 Glasses of wine per week     Comment: occasionally  . Drug Use: No  . Sexual Activity: Not Currently   Other Topics Concern  . Not on file   Social History Narrative  :  Pertinent items are noted in HPI.  Exam: Patient Vitals for the past 24 hrs:  BP Temp Temp src Pulse Resp SpO2  02/19/15 1809 128/62 mmHg 98.9 F (37.2 C) Oral 72 20 98 %  02/19/15 1506 (!) 136/57 mmHg 98.1 F (36.7 C) Oral 64 20 97 %  02/19/15 1016 135/69 mmHg 97.7 F (36.5 C) Oral 92 20 98 %  02/19/15 0522 132/72 mmHg 98.4 F (36.9 C) Oral 98 20 97 %  02/19/15 0113 (!) 128/53 mmHg 98.4 F (36.9 C) Oral 96 - 94 %  02/18/15 2136 (!) 128/58 mmHg 98 F (36.7 C) Oral 76 20 97 %    as above    Recent Labs  02/18/15 0718 02/19/15 0608  WBC 11.7* 12.7*  HGB 10.1* 11.3*  HCT 30.6* 33.1*  PLT 401* 432*     Recent Labs  02/17/15 0413 02/18/15 0718  NA 136 133*  K 3.5 3.3*  CL 106 102  CO2 23 24  GLUCOSE 96 118*  BUN <5* <5*  CREATININE 0.60 0.69  CALCIUM 8.0* 8.2*    Blood smear review: None  Pathology: See above     Assessment and Plan: Ms. Hagarty is 81 year old white female. She has  a colonic mass. Again, the pathologist cannot be definitive as whether or not she has a primary tumor or this is metastasis. It is unusual for tumors to metastasize to the colon.  By the elevated CA 125, one might have to think that she might have a primary ovarian malignancy. There is a sister who has had uterine cancer.  Regardless, it is obvious that without surgery, she will obstruct.  She is not a candidate for radiation and chemotherapy from my point of view. I think that this would be a very difficult protocol to try to get her through. I think surgery is definitely feasible and would offer her the best long-term quality of life from my point of view.  Having this CVA is a definite "monkey wrench" for her but I think that waiting a couple weeks so that she stabilizes a little bit would be reasonable.  I agree with the Lovenox. I think this be very reasonable for her.  It is hard to say whether or not she does have any hepatic lesions. Even if she does, I still would favor resecting the colonic lesion to help minimize obstructive issues in the future.  Her family was with [place]. I answered all their questions. They understand the precarious situation that she is in right now.  There is not much else that we need to do right now. I cannot think of any other lab work that needs to be done. I think trying to get her prealbumin up would be critical. I think nutrition and caloric intake is going to be vital if she is to have a good outcome from surgery.  I appreciate the opportunity to have seen her. I spent about 45 minutes or so with Miss Riggsbee her family.  Lum Keas

## 2015-02-19 NOTE — Progress Notes (Signed)
[facility] PROGRESS NOTE  Katherine Cooke HDQ:[id number] DOB: 1926/05/17 DOA: 02/13/2015 PCP: Idamae Schuller, MD    81?  hypertension,  spinal stenosis, admitted to the medicine service on 02/14/2015 presenting with complaints of lower abdominal pain. She reported having a small amount of blood morning of admission.   CT scan of abdomen and pelvis that showed masslike soft tissue involving the sigmoid colon that was concerning for malignancy. GI was consulted.  On 02/15/2015 she underwent sigmoidoscopy with biopsy. Pathology reported adenocarcinoma.  General surgery was consulted regarding possibility of surgical excision of mass.   02/16/2015 she had mental status change + right-sided facial droop with associated right-sided weakness.  MRI of the brain was ordered which unfortunately revealed multifocal areas of acute infarction affecting cerebellum, left pons, left thalamus and left posterior parietal and occipital regions. Dr. Armida Sans of neurology who recommended starting aspirin 325 mg by mouth every daily and transferring patient to [facility] for further evaluation and treatment.  ultimately felt she might have hypercoagulability 2/2 to CVA and started on lovenox [place] in setting of acute CVa would be HI RISK for immediate surgery/colectoym and i had a discussion in detail with the family regarding this and risks of surgery I asked Dr. Marin Olp to weigh in from an Oncology perspective   Mild confusion Otherwise fine Improved from yesterday. No c/o family bedside  Assessment/Plan: 1. Acute CVA. -9/13 = mental status changes. CT scan of brain did not show acute intracranial abnormalities however further workup with MRI of brain showed multifocal areas of acute infarct. -CT angiogram as above -Await results of transthoracic echocardiogram, carotid Dopplers, MRA, bilateral lower extremity venous Dopplers.  -neurology who recommended initiating aspirin 325 mg by mouth  daily  2.  Poorly diff Ca-Unclea rif Thyroid !ry-consider Familial adenomatous polyposis? -Patient diagnosed with adenocarcinoma affecting sigmoid during this hospitalization, undergoing a sigmoidoscopy on 02/15/2015 with biopsies of mass. Unfortunately this revealed poorly differentiated Ca -It is possible underlying malignancy led to a hypercoagulable state, ultimately leading to stroke of thromboembolic phenomenon. -She remains on ciprofloxacin and Flagyl given concerns for necrosis and infection--I would defer continued use of this general surgery and would narrowed to Zosyn -Given acute CVA surgery will need to be postponed -Gen surgery feels it is reasonable to feed patient as long as we can keep stools soft and i started a low fiber diet 9/16  3.  Hyponatremia. -Likely secondary to prerenal azotemia -Sodium improved from 133-136  4. Leukocytosis + mild thromobocytosis Unclear etiology--could be subclinical aspiration Afebrile currently  5.Hypertension. -Blood pressures are stable  6. AKI Initial creat 1.51 On saline-Continue and resolving to <5/0.69   Consultants:  Neurology  Gen surgery  GI Dr. Cristina Gong  Oncology Dr. Earlie Server   Antibiotics:  Ciprofloxacin  Flagyl  CTA head  1. Acute/subacute infarcts of the left thalamus and right cerebellum. 2. Moderate stenoses at the origins of the vertebral arteries bilaterally, worse on the right. 3. Tortuosity of the vertebral arteries without other focal stenoses. 4. The posterior cerebral arteries are of fetal type with a small P1 segments. 5. Marked tortuosity of the common and internal carotid arteries bilaterally without significant stenoses. 6. Dense atherosclerotic calcifications at the carotid bifurcations bilaterally without significant stenosis. 7. Multilevel spondylosis of the cervical spine.  Objective: Filed Vitals:   02/19/15 1506  BP: 136/57  Pulse: 64  Temp: 98.1 F (36.7 C)  Resp: 20   No  intake or output data in the 24 hours ending 02/19/15 1555  Filed Weights   02/13/15 2116 02/15/15 1448  Weight: 48.535 kg (107 lb) 46.72 kg (103 lb)    Exam:   General:  Ill-appearing, patient has less slurred speech.  she is awake and alert and can follow some commands  Cardiovascular: Regular rate and rhythm normal S1-S2 no murmurs rubs or gallops  Respiratory: Normal respiratory for, lungs are clear to auscultation bilaterally no wheezing rhonchi or rales  Abdomen: Patient having mild tenderness to palpation over lower, region  Neuur: facial droop improved-power intact.  vrbalizing well.  But a little confused  Data Reviewed: Basic Metabolic Panel:  Recent Labs Lab 02/14/15 0512 02/15/15 0404 02/16/15 0421 02/17/15 0413 02/18/15 0718  NA 132* 133* 133* 136 133*  K 3.6 3.8 3.7 3.5 3.3*  CL 100* 101 103 106 102  CO2 '26 26 24 23 24  '$ GLUCOSE 124* 107* 97 96 118*  BUN '20 11 7 '$ <5* <5*  CREATININE 0.95 0.82 0.72 0.60 0.69  CALCIUM 8.1* 8.2* 8.0* 8.0* 8.2*  MG 2.1  --   --   --   --   PHOS 3.6  --   --   --   --    Liver Function Tests:  Recent Labs Lab 02/13/15 1217 02/14/15 0512  AST 22 38  ALT 13* 13*  ALKPHOS 86 73  BILITOT 0.8 0.3  PROT 7.1 5.8*  ALBUMIN 3.1* 2.5*    Recent Labs Lab 02/13/15 1217  LIPASE 18*   No results for input(s): AMMONIA in the last 168 hours. CBC:  Recent Labs Lab 02/15/15 0404 02/16/15 0421 02/17/15 0413 02/18/15 0718 02/19/15 0608  WBC 12.0* 10.6* 12.2* 11.7* 12.7*  HGB 9.4* 9.4* 9.4* 10.1* 11.3*  HCT 28.2* 27.9* 28.2* 30.6* 33.1*  MCV 98.6 98.2 98.3 96.8 97.1  PLT 422* 397 404* 401* 432*   Cardiac Enzymes: No results for input(s): CKTOTAL, CKMB, CKMBINDEX, TROPONINI in the last 168 hours. BNP (last 3 results) No results for input(s): BNP in the last 8760 hours.  ProBNP (last 3 results) No results for input(s): PROBNP in the last 8760 hours.  CBG:  Recent Labs Lab 02/16/15 2243  GLUCAP 98    No results  found for this or any previous visit (from the past 240 hour(s)).   Studies: Ct Angio Head W/cm &/or Wo Cm  02/17/2015   CLINICAL DATA:  Stroke with cerebral ischemia. Difficulty speaking. Recent diagnosis of colon cancer.  EXAM: CT ANGIOGRAPHY HEAD AND NECK  TECHNIQUE: Multidetector CT imaging of the head and neck was performed using the standard protocol during bolus administration of intravenous contrast. Multiplanar CT image reconstructions and MIPs were obtained to evaluate the vascular anatomy. Carotid stenosis measurements (when applicable) are obtained utilizing NASCET criteria, using the distal internal carotid diameter as the denominator.  CONTRAST:  31m OMNIPAQUE IOHEXOL 350 MG/ML SOLN  COMPARISON:  MRI of the brain 02/17/2015.  FINDINGS: CT HEAD  Brain: The acute left thalamic infarct is less well seen by CT. Mild periventricular white matter changes are again noted bilaterally. No acute cortical infarct or hemorrhage is present. The ventricles are stable. No significant extra-axial fluid collection is present. Acute and remote lacunar infarcts are again noted in the cerebellum.  Calvarium and skull base: Within normal limits.  Paranasal sinuses: Cleared  Orbits: Bilateral lens replacements are noted. The globes and orbits are otherwise intact.  CTA NECK  Aortic arch: Atherosclerotic calcifications are present at the origins of the left common carotid artery and the left subclavian artery  without significant stenosis. Minimal calcification is present along the undersurface of the aortic arch.  Right carotid system: Moderate tortuosity is present in the right common carotid artery without significant stenosis. Dense calcifications are present at the right carotid bifurcation. There is mild narrowing, less than 50% relative to the more distal vessel. The cervical right ICA is also tortuous without significant stenosis.  Left carotid system: The left common carotid artery is tortuous without  significant stenosis. Dense atherosclerotic calcifications are present at the carotid bifurcation without significant stenosis. There is significant tortuosity of the cervical left ICA without significant stenosis.  Vertebral arteries:A 50% stenosis is present in the right subclavian artery just beyond the bifurcation of the carotid artery. Dense calcifications are present at the origins of the vertebral arteries bilaterally. Mild to moderate stenoses are worse on the right. There is significant tortuosity of the proximal vertebral arteries bilaterally without additional significant stenoses in the cervical vertebral arteries. The left vertebral artery is slightly dominant to the right.  Skeleton: Bone windows demonstrate multilevel endplate degenerative change. There is chronic loss of height at C5-6 and C6-7. Grade 1 anterolisthesis is degenerative at C3-4 and C4-5. No focal lytic or blastic lesions are present.  Other neck: Mild dependent atelectasis is present at the lung apices. No significant cervical adenopathy is present. Salivary glands are within normal limits. The thyroid is unremarkable.  CTA HEAD  Anterior circulation: There is mild ectasia of the internal carotid arteries bilaterally from the high cervical segments through the ICA termini. The A1 and M1 segments are within normal limits. The anterior communicating artery is patent. MCA bifurcations are intact bilaterally. There is some attenuation of distal MCA branch vessels bilaterally without significant proximal stenosis or occlusion.  Posterior circulation: The left vertebral artery is the dominant vessel. PICA origins are visualized and within normal limits. The basilar artery is within normal limits. Both posterior cerebral arteries are of fetal type with small contribution from the P1 segments.  Venous sinuses: The dural sinuses are patent. The straight sinus and deep cerebral veins are patent.  Anatomic variants: Bilateral fetal type posterior  cerebral arteries with small P1 segments.  Delayed phase: No pathologic enhancement is present.  IMPRESSION: 1. Acute/subacute infarcts of the left thalamus and right cerebellum. 2. Moderate stenoses at the origins of the vertebral arteries bilaterally, worse on the right. 3. Tortuosity of the vertebral arteries without other focal stenoses. 4. The posterior cerebral arteries are of fetal type with a small P1 segments. 5. Marked tortuosity of the common and internal carotid arteries bilaterally without significant stenoses. 6. Dense atherosclerotic calcifications at the carotid bifurcations bilaterally without significant stenosis. 7. Multilevel spondylosis of the cervical spine.   Electronically Signed   By: San Morelle M.D.   On: 02/17/2015 18:34   Ct Angio Neck W/cm &/or Wo/cm  02/17/2015   CLINICAL DATA:  Stroke with cerebral ischemia. Difficulty speaking. Recent diagnosis of colon cancer.  EXAM: CT ANGIOGRAPHY HEAD AND NECK  TECHNIQUE: Multidetector CT imaging of the head and neck was performed using the standard protocol during bolus administration of intravenous contrast. Multiplanar CT image reconstructions and MIPs were obtained to evaluate the vascular anatomy. Carotid stenosis measurements (when applicable) are obtained utilizing NASCET criteria, using the distal internal carotid diameter as the denominator.  CONTRAST:  20m OMNIPAQUE IOHEXOL 350 MG/ML SOLN  COMPARISON:  MRI of the brain 02/17/2015.  FINDINGS: CT HEAD  Brain: The acute left thalamic infarct is less well seen by CT. Mild periventricular  white matter changes are again noted bilaterally. No acute cortical infarct or hemorrhage is present. The ventricles are stable. No significant extra-axial fluid collection is present. Acute and remote lacunar infarcts are again noted in the cerebellum.  Calvarium and skull base: Within normal limits.  Paranasal sinuses: Cleared  Orbits: Bilateral lens replacements are noted. The globes and  orbits are otherwise intact.  CTA NECK  Aortic arch: Atherosclerotic calcifications are present at the origins of the left common carotid artery and the left subclavian artery without significant stenosis. Minimal calcification is present along the undersurface of the aortic arch.  Right carotid system: Moderate tortuosity is present in the right common carotid artery without significant stenosis. Dense calcifications are present at the right carotid bifurcation. There is mild narrowing, less than 50% relative to the more distal vessel. The cervical right ICA is also tortuous without significant stenosis.  Left carotid system: The left common carotid artery is tortuous without significant stenosis. Dense atherosclerotic calcifications are present at the carotid bifurcation without significant stenosis. There is significant tortuosity of the cervical left ICA without significant stenosis.  Vertebral arteries:A 50% stenosis is present in the right subclavian artery just beyond the bifurcation of the carotid artery. Dense calcifications are present at the origins of the vertebral arteries bilaterally. Mild to moderate stenoses are worse on the right. There is significant tortuosity of the proximal vertebral arteries bilaterally without additional significant stenoses in the cervical vertebral arteries. The left vertebral artery is slightly dominant to the right.  Skeleton: Bone windows demonstrate multilevel endplate degenerative change. There is chronic loss of height at C5-6 and C6-7. Grade 1 anterolisthesis is degenerative at C3-4 and C4-5. No focal lytic or blastic lesions are present.  Other neck: Mild dependent atelectasis is present at the lung apices. No significant cervical adenopathy is present. Salivary glands are within normal limits. The thyroid is unremarkable.  CTA HEAD  Anterior circulation: There is mild ectasia of the internal carotid arteries bilaterally from the high cervical segments through the  ICA termini. The A1 and M1 segments are within normal limits. The anterior communicating artery is patent. MCA bifurcations are intact bilaterally. There is some attenuation of distal MCA branch vessels bilaterally without significant proximal stenosis or occlusion.  Posterior circulation: The left vertebral artery is the dominant vessel. PICA origins are visualized and within normal limits. The basilar artery is within normal limits. Both posterior cerebral arteries are of fetal type with small contribution from the P1 segments.  Venous sinuses: The dural sinuses are patent. The straight sinus and deep cerebral veins are patent.  Anatomic variants: Bilateral fetal type posterior cerebral arteries with small P1 segments.  Delayed phase: No pathologic enhancement is present.  IMPRESSION: 1. Acute/subacute infarcts of the left thalamus and right cerebellum. 2. Moderate stenoses at the origins of the vertebral arteries bilaterally, worse on the right. 3. Tortuosity of the vertebral arteries without other focal stenoses. 4. The posterior cerebral arteries are of fetal type with a small P1 segments. 5. Marked tortuosity of the common and internal carotid arteries bilaterally without significant stenoses. 6. Dense atherosclerotic calcifications at the carotid bifurcations bilaterally without significant stenosis. 7. Multilevel spondylosis of the cervical spine.   Electronically Signed   By: San Morelle M.D.   On: 02/17/2015 18:34   Ct Chest W Contrast  02/19/2015   CLINICAL DATA:  81 year old who presented this admission with abdominal pain a constipation, found to have a soft tissue mass in the pelvis associated with the  sigmoid colon, pathology demonstrating poorly differentiated adenocarcinoma with possible lung or thyroid primary. Staging CT to evaluate for possible primary lesion or metastatic disease.  EXAM: CT CHEST WITH CONTRAST  TECHNIQUE: Multidetector CT imaging of the chest was performed during  intravenous contrast administration.  CONTRAST:  75 ml Omnipaque 300 IV.  COMPARISON:  No prior chest CT.  FINDINGS: Lungs: No pulmonary parenchymal nodules or masses to suggest primary bronchogenic carcinoma or metastatic disease. Emphysematous changes throughout both lungs. Mild atelectasis involving the lower lobes. No confluent airspace consolidation. No evidence of interstitial lung disease.  Trachea/bronchi: Trachea and mainstem bronchi patent. Calcified tracheobronchial cartilages. Mucous plugging involving the right lower lobe segmental bronchi. No endobronchial soft tissue mass.  Pleura:  Small bilateral pleural effusions.  No pleural masses.  Mediastinum:  No mediastinal masses.  Heart and Vascular: Heart moderately enlarged. Extensive mitral annular calcification. Mild aortic valvular calcification. Severe LAD coronary atherosclerosis. No pericardial effusion. Moderate to severe atherosclerosis involving the thoracic and upper abdominal aorta without evidence of aneurysm or dissection.  Lymphatic: No pathologic mediastinal, hilar or axillary lymphadenopathy.  Other findings: None.  Visualized lower neck: Thyroid gland normal in appearance. No supraclavicular lymphadenopathy.  Visualized upper abdomen: No new finding since the CT abdomen and pelvis 02/13/2015.  Musculoskeletal: No evidence of osseous metastatic disease. Degenerative disc disease and spondylosis throughout the thoracic spine.  IMPRESSION: 1. No evidence of a primary malignancy or metastatic disease in the thorax. Specifically, no lung mass and no thyroid mass. 2. COPD/emphysema. Mild atelectasis in the lower lobes. Mucous plugging involving segmental right lower lobe bronchi. Small bilateral pleural effusions. No acute cardiopulmonary disease otherwise. 3. Moderate cardiomegaly. Extensive mitral annular calcification. Mild aortic valvular calcification. Severe LAD coronary atherosclerosis.   Electronically Signed   By: Evangeline Dakin M.D.    On: 02/19/2015 10:24    Scheduled Meds: . aspirin  325 mg Oral Daily  . cycloSPORINE  1 drop Both Eyes BID  . docusate sodium  200 mg Oral QHS  . enoxaparin (LOVENOX) injection  1 mg/kg Subcutaneous Q24H  . feeding supplement (ENSURE ENLIVE)  237 mL Oral BID BM  . montelukast  10 mg Oral QHS  . polyethylene glycol  17 g Oral Daily  . potassium chloride  40 mEq Oral Daily  . saccharomyces boulardii  250 mg Oral BID   Continuous Infusions: . sodium chloride 75 mL/hr at 02/17/15 1856    Principal Problem:   Colonic mass Active Problems:   Hypertension   Osteopenia   Leukocytosis   Anemia of chronic disease   CVA (cerebral infarction)   Stroke with cerebral ischemia   Malignancy    Time spent: 20 min Verneita Griffes, MD [facility] (P) [phone]

## 2015-02-19 NOTE — Care Management Note (Signed)
Case Management Note  Patient Details  Name: LEVANA MINETTI MRN: [id number] Date of Birth: 1926-04-21  Subjective/Objective:                    Action/Plan: Met with patient and children at bedside to discuss discharge planning.  Patient and family are aware that there is potential that she may not be able to be admitted to [place] when medically stable due to bed availability.  Family would like to have a referral sent to [facility] as a second choice. They are aware that when patient is medically stable and discharged, she will be discharged to whichever Inpatient Rehab/SNF Rehab has bed availability that day.  CM spoke with Freda Munro at [facility] and requested clinicals were faxed. CM will continue to follow for discharge needs. CSW is aware.  Expected Discharge Date:  02/18/15               Expected Discharge Plan:  IP Rehab Facility  In-House Referral:  Clinical Social Work  Discharge planning Services  CM Consult  Post Acute Care Choice:    Choice offered to:  Patient, Adult Children  DME Arranged:    DME Agency:     HH Arranged:    [place] Agency:     Status of Service:  In process, will continue to follow  Medicare Important Message Given:  Yes-third notification given Date Medicare IM Given:    Medicare IM give by:    Date Additional Medicare IM Given:    Additional Medicare Important Message give by:     If discussed at [place] of Stay Meetings, dates discussed:    Additional Comments:  Rolm Baptise, RN 02/19/2015, 3:43 PM

## 2015-02-19 NOTE — Progress Notes (Signed)
Physical Therapy Treatment Patient Details Name: Katherine Cooke MRN: [id number] DOB: 1925/09/24 Today's Date: 02/19/2015    History of Present Illness Pt admitted to [facility] with colonic mass. Developed facial droop, vision and speech changes.  MRI revealed multifocal areas of acute infarct affecting cerebellum, L pons, L thalamus, L parietal and B occipital lobes. Transferred to [facility].    PT Comments    Patient progressing with speech, vision and mobility this session.  Overall min assist level and reliant on walker, but note improving coordination and strength left LE.  Patient well fatigued with ambulation today.  Family in the room and very attentive.  Will continue to follow acutely.  Follow Up Recommendations  CIR;Supervision/Assistance - 24 hour     Equipment Recommendations  Rolling walker with 5" wheels    Recommendations for Other Services       Precautions / Restrictions Precautions Precautions: Fall    Mobility  Bed Mobility Overal bed mobility: Needs Assistance       Supine to sit: Min assist     General bed mobility comments: assist up from flat bed and no rails  Transfers Overall transfer level: Needs assistance Equipment used: Rolling walker (2 wheeled) Transfers: Sit to/from Stand Sit to Stand: Min assist         General transfer comment: assist for anterior weight shift and safety with hand placement  Ambulation/Gait Ambulation/Gait assistance: Min assist Ambulation Distance (Feet): 150 Feet Assistive device: Rolling walker (2 wheeled) Gait Pattern/deviations: Step-through pattern;Decreased stride length;Decreased step length - right;Decreased dorsiflexion - left;Trunk flexed Gait velocity: slow and deliberate   General Gait Details: cues for posture,visual scanning and left heel strike; also cues for safety with turns and obstacle negotiation; continues to close right eye to improve clarity (family reports previous difficulty due to macular  degeneration)   Stairs            Wheelchair Mobility    Modified Rankin (Stroke Patients Only) Modified Rankin (Stroke Patients Only) Pre-Morbid Rankin Score: No symptoms Modified Rankin: Moderately severe disability     Balance Overall balance assessment: Needs assistance         Standing balance support: Bilateral upper extremity supported Standing balance-Leahy Scale: Poor Standing balance comment: UE support needed for balance                    Cognition Arousal/Alertness: Awake/alert Behavior During Therapy: WFL for tasks assessed/performed Overall Cognitive Status: Within Functional Limits for tasks assessed                      Exercises      General Comments General comments (skin integrity, edema, etc.): some bowel incontinence during hallway ambulation; assisted to bathroom and with hygiene      Pertinent Vitals/Pain Pain Assessment: No/denies pain Pain Score: 0-No pain    Home Living                      Prior Function            PT Goals (current goals can now be found in the care plan section) Progress towards PT goals: Progressing toward goals    Frequency  Min 4X/week    PT Plan Current plan remains appropriate    Co-evaluation             End of Session Equipment Utilized During Treatment: Gait belt Activity Tolerance: Patient limited by fatigue Patient left: in bed;with call bell/phone  within reach;with family/visitor present     Time: 1194-1740 PT Time Calculation (min) (ACUTE ONLY): 33 min  Charges:  $Gait Training: 8-22 mins $Therapeutic Activity: 8-22 mins                    G Codes:      WYNN,Katherine Cooke March 21, 2015, 5:46 PM  Magda Kiel, [place] 2015/03/21

## 2015-02-19 NOTE — Consult Note (Signed)
Physical Medicine and Rehabilitation Consult   Reason for Consult: Difficulty with speech and impaired balance Referring Physician: Dr. Verlon Au.    HPI: Katherine Cooke is a 81 y.o. female with history of HTN, glaucoma, macular degeneration, BCC, abdominal mass X years who was admitted on 02/13/15 with progressive abdominal pain X 1 week and constipation. She was found to have extensive left colonic diverticulosis with complex mas 7.6 X 7.6 x 6.3 cm , 1.4 cm liver lesion and small pelvic and retroperitoneal nodes. She was started on clear liquids/antibiotics due to leucocytosis and concerns of diverticulitis and sigmoidoscopy with biopsy positive for poorly differentiated adenocarcinoma which was likely arising from outside the colon. Pathology with possibility of lung or thyroid primary.  She has been evaluated by [facility] and Dr. Earlie Server for input. Patient to be presented at GI conference for input and to continue on liquid diet as patient not fully obstructed.  On 09/13, patient was noted to have confusion with difficulty talking. MRI brain done revealing multifocal areas of acute infarct affecting cerebellum, left paramedian pons, left thalamus, left posterior parietal and occipital regions question embolic from posterior circulation.  neurology consulted and recommended full work up with CTA brain/neck done revealing acute/subacute infarcts left thalamus and right cerebellum and moderate stenosis at origins of B-VA worse on right. 2D echo with EF 50-55% with moderate mitral calcification and grade 1 diastolic dysfunction.   Therapeutic dose of Lovenox added due to hypercoagulable state and neurology recommends holding off on urgent surgical procedure for 2 weeks and elective procedure for 6 months.  Patient placed on ASA 373 mg for embolic stroke likely due to hypercoagulopathy  from advanced malignancy and to avoid hypotension. She continues on clear liquids and surgery has signed off. CT chest  ordered for work up and pending. Therapy evaluations completed yesterday and CIR recommended for follow up therapy. Patient with resultant impaired balance, visual deficits, decreased activity tolerance, expressive aphasia with deficits in following multi-step commands. Patient and family would prefer CIR then transition to [facility].     Review of Systems  Constitutional: Positive for malaise/fatigue.  HENT: Negative for hearing loss.   Eyes: Positive for blurred vision. Negative for pain.       Minimal vision in right eye due to glaucoma.   Respiratory: Negative for cough, shortness of breath and wheezing.   Cardiovascular: Negative for chest pain and palpitations.  Gastrointestinal: Positive for abdominal pain (on and off for months) and constipation. Negative for heartburn and nausea.  Musculoskeletal: Negative for myalgias, back pain and joint pain.  Skin: Negative for itching and rash.  Neurological: Positive for speech change and weakness. Negative for dizziness, tingling and headaches.      Past Medical History  Diagnosis Date  . Hypertension   . Spinal stenosis   . Basal cell carcinoma of nose 2005  . Squamous cell carcinoma of left hip 2005  . Colon cancer dx'd 02/16/15  . Diabetes mellitus without complication   . Glaucoma   . Macular degeneration      Past Surgical History  Procedure Laterality Date  . Breast biopsy  1950's ; 1952 ; 1954    x 3 - Benign  . Tonsillectomy    . Flexible sigmoidoscopy N/A 02/15/2015    Procedure: FLEXIBLE SIGMOIDOSCOPY;  Surgeon: Ronald Lobo, MD;  Location: [facility];  Service: Endoscopy;  Laterality: N/A;     Family History  Problem Relation Age of Onset  . Breast cancer Maternal  Aunt   . Ovarian cancer Sister   . Heart disease Father   . Ulcers Father   . COPD Mother      Social History:  Widowed. Lives in independent living at [place]. Has assistance with home management and goes to the dinning hall for most meals.  She  reports that she has never smoked. She has never used smokeless tobacco. She reports that she drinks alcohol daily.   She reports that she does not use illicit drugs.   Allergies  Allergen Reactions  . Lyrica [Pregabalin] Shortness Of Breath    Medications Prior to Admission  Medication Sig Dispense Refill  . ALPRAZolam (XANAX) 0.25 MG tablet Take 0.25 mg by mouth daily as needed for anxiety or sleep.     . Ascorbic Acid (VITAMIN C PO) Take 1 tablet by mouth daily.     Marland Kitchen aspirin 81 MG tablet Take 81 mg by mouth daily.    . bisacodyl (DULCOLAX) 5 MG EC tablet Take 5 mg by mouth daily as needed for mild constipation or moderate constipation.    . Ca Carbonate-Mag Hydroxide (ROLAIDS PO) Take 2 tablets by mouth 2 (two) times daily as needed (indigestion).    . cholecalciferol (VITAMIN D) 1000 UNITS tablet Take 1,000 Units by mouth daily.    . cycloSPORINE (RESTASIS) 0.05 % ophthalmic emulsion Place 1 drop into both eyes 2 (two) times daily.    . diclofenac sodium (VOLTAREN) 1 % GEL Apply 4 g topically daily as needed (pain).     . ibandronate (BONIVA) 150 MG tablet Take 150 mg by mouth every 30 (thirty) days. Take in the morning with a full glass of water, on an empty stomach, and do not take anything else by mouth or lie down for the next 30 min.    Marland Kitchen losartan-hydrochlorothiazide (HYZAAR) 100-12.5 MG per tablet Take 1 tablet by mouth daily.     Marland Kitchen MAGNESIUM PO Take 1 tablet by mouth daily.     . montelukast (SINGULAIR) 10 MG tablet Take 10 mg by mouth at bedtime.  3  . Multiple Vitamins-Minerals (EYE VITAMINS PO) Take by mouth.    . Omega-3 Fatty Acids (FISH OIL PO) Take 2 capsules by mouth 2 (two) times daily.     . polyethylene glycol (MIRALAX / GLYCOLAX) packet Take 17 g by mouth daily as needed for mild constipation or moderate constipation.    . triazolam (HALCION) 0.25 MG tablet Take 0.25 mg by mouth at bedtime as needed for sleep.       Home: Home Living Family/patient expects  to be discharged to:: Private residence Living Arrangements: Alone Available Help at Discharge: Family Type of Home: Independent living facility Home Access: Level entry [place]: One level Bathroom Shower/Tub: Multimedia programmer: Handicapped height [place]: Grab bars - tub/shower, Grab bars - toilet Additional Comments: Pt lives in [place] at [place].  Lives With: Alone  Functional History: Prior Function Level of Independence: Independent Comments: Pt living on her own, ambulating to/from dining hall, driving. Functional Status:  Mobility: Bed Mobility Overal bed mobility: Needs Assistance Bed Mobility: Supine to Sit Supine to sit: HOB elevated, Min guard General bed mobility comments: pt in chair Transfers Overall transfer level: Needs assistance Equipment used: 1 person hand held assist Transfers: Sit to/from Stand, Stand Pivot Transfers Sit to Stand: Min assist Stand pivot transfers: Min assist Squat pivot transfers: Min assist, Mod assist General transfer comment: pt reliant on UEs, stood slowly, required steadying assist, but  no assist to rise, initially reluctant to release arms of chair in standing Ambulation/Gait Ambulation/Gait assistance: Mod assist, +2 safety/equipment (chair following) Ambulation Distance (Feet): 30 Feet Assistive device: Rolling walker (2 wheeled) Gait Pattern/deviations: Step-through pattern, Decreased stride length, Decreased step length - right, Decreased dorsiflexion - right, Trunk flexed General Gait Details: cues for posture, lifting right LE for clearance, for eyes ahead.  assist for balance, walker safety/progression    ADL: ADL Overall ADL's : Needs assistance/impaired Eating/Feeding: Set up, Sitting Eating/Feeding Details (indicate cue type and reason): pt on liquid diet, poor appetite Grooming: Wash/dry hands, Wash/dry face, Sitting, Set up Upper Body Bathing: Minimal assitance, Sitting Lower Body Bathing:  Minimal assistance, Sit to/from stand Upper Body Dressing : Minimal assistance, Sitting Lower Body Dressing: Minimal assistance, Sit to/from stand Lower Body Dressing Details (indicate cue type and reason): able to don and doff socks Toilet Transfer: Minimal assistance, Stand-pivot Toileting- Clothing Manipulation and Hygiene: Minimal assistance, Sitting/lateral lean, Sit to/from stand, Moderate assistance  Cognition: Cognition Overall Cognitive Status: Difficult to assess Orientation Level: Oriented to person, Oriented to place, Disoriented to time, Disoriented to situation Attention: Sustained Sustained Attention: Appears intact Awareness: Appears intact Cognition Arousal/Alertness: Awake/alert Behavior During Therapy: WFL for tasks assessed/performed Overall Cognitive Status: Difficult to assess Area of Impairment: Orientation, Attention, Following commands, Awareness, Safety/judgement Orientation Level: Disoriented to, Time, Situation Current Attention Level: Selective Memory: Decreased recall of precautions Following Commands: Follows multi-step commands inconsistently Awareness: Intellectual General Comments: Pt able to follow commands and answer most questions clearly. Cognition difficult to assess due to expressive aphasia. Difficult to assess due to: Impaired communication   Blood pressure 132/72, pulse 98, temperature 98.4 F (36.9 C), temperature source Oral, resp. rate 20, height '4\' 11"'$  (1.499 m), weight 46.72 kg (103 lb), SpO2 97 %. Physical Exam  Nursing note and vitals reviewed. Constitutional: She appears well-developed and well-nourished. She is sleeping. She is easily aroused.  Fatigued appearing elderly female.   HENT:  Head: Normocephalic and atraumatic.  Eyes: Right eye exhibits abnormal extraocular motion. Right pupil is not reactive.  Right exophthalmus.  Neck: Normal range of motion. Neck supple.  Cardiovascular: Normal rate and regular rhythm.   Murmur  heard. Respiratory: Effort normal and breath sounds normal. No respiratory distress. She has no wheezes.  GI: Soft. Bowel sounds are normal. She exhibits no distension. There is tenderness (RLQ to palpation).  Musculoskeletal: She exhibits no edema.  Neurological: She is alert and easily aroused. A cranial nerve deficit is present.  Soft voice. Oriented to place "hospital in [place]" and situation as "mass in stomach".  Right facial weakness. Soft voice with expressive deficits. Able to follow simple one and two step commands with minimal cues. RUE with pronator drift and decrease in fine motor control. Vision affecting exam and tended to close right eye for focus.  Right sided weakness 4/5 UE/LE.  Sensation intact.   Skin: Skin is warm and dry. No rash noted. She is not diaphoretic. No erythema.  Psychiatric: Her affect is blunt. Her speech is delayed. She is slowed. Cognition and memory are impaired.  Patient is able to name simple objects such as pen and ring and watch but is unable to name stethoscope. Has some ataxia when reaching for objects on the right side.  Results for orders placed or performed during the hospital encounter of 02/13/15 (from the past 24 hour(s))  CBC     Status: Abnormal   Collection Time: 02/19/15  6:08 AM  Result Value Ref  Range   WBC 12.7 (H) 4.0 - 10.5 K/uL   RBC 3.41 (L) 3.87 - 5.11 MIL/uL   Hemoglobin 11.3 (L) 12.0 - 15.0 g/dL   HCT 33.1 (L) 36.0 - 46.0 %   MCV 97.1 78.0 - 100.0 fL   MCH 33.1 26.0 - 34.0 pg   MCHC 34.1 30.0 - 36.0 g/dL   RDW 12.9 11.5 - 15.5 %   Platelets 432 (H) 150 - 400 K/uL   Dg Chest 1 View  02/17/2015   CLINICAL DATA:  Delirium and weakness. 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 History of colon carcinoma.  EXAM: CHEST  1 VIEW  COMPARISON:  12/28/2010  FINDINGS: Cardiac silhouette is mildly enlarged. No mediastinal or hilar masses or evidence of adenopathy.   Lungs are clear.  No pleural effusion or pneumothorax.  Bony thorax is demineralized but grossly intact.  IMPRESSION: No active disease.   Electronically Signed   By: Lajean Manes M.D.   On: 02/17/2015 10:41    Ct Angio Neck W/cm &/or Wo/cm  02/17/2015   CLINICAL DATA:  Stroke with cerebral ischemia. Difficulty speaking. Recent diagnosis of colon cancer.  EXAM: CT ANGIOGRAPHY HEAD AND NECK  TECHNIQUE: Multidetector CT imaging of the head and neck was performed using the standard protocol during bolus administration of intravenous contrast. Multiplanar CT image reconstructions and MIPs were obtained to evaluate the vascular anatomy. Carotid stenosis measurements (when applicable) are obtained utilizing NASCET criteria, using the distal internal carotid diameter as the denominator.  CONTRAST:  36m OMNIPAQUE IOHEXOL 350 MG/ML SOLN  COMPARISON:  MRI of the brain 02/17/2015.  FINDINGS: CT HEAD  Brain: The acute left thalamic infarct is less well seen by CT. Mild periventricular white matter changes are again noted bilaterally. No acute cortical infarct or hemorrhage is present. The ventricles are stable. No significant extra-axial fluid collection is present. Acute and remote lacunar infarcts are again noted in the cerebellum.  Calvarium and skull base: Within normal limits.  Paranasal sinuses: Cleared  Orbits: Bilateral lens replacements are noted. The globes and orbits are otherwise intact.  CTA NECK  Aortic arch: Atherosclerotic calcifications are present at the origins of the left common carotid artery and the left subclavian artery without significant stenosis. Minimal calcification is present along the undersurface of the aortic arch.  Right carotid system: Moderate tortuosity is present in the right common carotid artery without significant stenosis. Dense calcifications are present at the right carotid bifurcation. There is mild narrowing, less than 50% relative to the more distal vessel. The cervical right  ICA is also tortuous without significant stenosis.  Left carotid system: The left common carotid artery is tortuous without significant stenosis. Dense atherosclerotic calcifications are present at the carotid bifurcation without significant stenosis. There is significant tortuosity of the cervical left ICA without significant stenosis.  Vertebral arteries:A 50% stenosis is present in the right subclavian artery just beyond the bifurcation of the carotid artery. Dense calcifications are present at the origins of the vertebral arteries bilaterally. Mild to moderate stenoses are worse on the right. There is significant tortuosity of the proximal vertebral arteries bilaterally without additional significant stenoses in the cervical vertebral arteries. The left vertebral artery is slightly dominant to the right.  Skeleton: Bone windows demonstrate multilevel endplate degenerative change. There is chronic loss of height at C5-6 and C6-7. Grade 1 anterolisthesis is degenerative at C3-4 and C4-5. No focal lytic or blastic lesions  are present.  Other neck: Mild dependent atelectasis is present at the lung apices. No significant cervical adenopathy is present. Salivary glands are within normal limits. The thyroid is unremarkable.  CTA HEAD  Anterior circulation: There is mild ectasia of the internal carotid arteries bilaterally from the high cervical segments through the ICA termini. The A1 and M1 segments are within normal limits. The anterior communicating artery is patent. MCA bifurcations are intact bilaterally. There is some attenuation of distal MCA branch vessels bilaterally without significant proximal stenosis or occlusion.  Posterior circulation: The left vertebral artery is the dominant vessel. PICA origins are visualized and within normal limits. The basilar artery is within normal limits. Both posterior cerebral arteries are of fetal type with small contribution from the P1 segments.  Venous sinuses: The dural  sinuses are patent. The straight sinus and deep cerebral veins are patent.  Anatomic variants: Bilateral fetal type posterior cerebral arteries with small P1 segments.  Delayed phase: No pathologic enhancement is present.  IMPRESSION: 1. Acute/subacute infarcts of the left thalamus and right cerebellum. 2. Moderate stenoses at the origins of the vertebral arteries bilaterally, worse on the right. 3. Tortuosity of the vertebral arteries without other focal stenoses. 4. The posterior cerebral arteries are of fetal type with a small P1 segments. 5. Marked tortuosity of the common and internal carotid arteries bilaterally without significant stenoses. 6. Dense atherosclerotic calcifications at the carotid bifurcations bilaterally without significant stenosis. 7. Multilevel spondylosis of the cervical spine.   Electronically Signed   By: San Morelle M.D.   On: 02/17/2015 18:34    Mr Brain Wo Contrast  02/17/2015   CLINICAL DATA:  Confusion with slurred speech and difficulty with word finding. Blurred vision. Symptoms began 02/16/2015.  EXAM: MRI HEAD WITHOUT CONTRAST  TECHNIQUE: Multiplanar, multiecho pulse sequences of the brain and surrounding structures were obtained without intravenous contrast.  COMPARISON:  CT head 02/16/2015.  FINDINGS: Multifocal areas of restricted diffusion representing acute infarction are observed. These are located in multiple vascular territories. Subcentimeter BILATERAL cerebellar cortical and subcortical infarcts are seen. Linear LEFT paramedian acute pontine infarct. Scattered medial LEFT parietal and lateral LEFT occipital cortical and subcortical acute infarcts. Largest area of infarction affects the LEFT thalamus, 10 x 14 mm. All of these acute infarcts lie within the distribution of the BILATERAL vertebral arteries and basilar artery, and felt to represent posterior circulation event.  No acute or chronic hemorrhage, mass lesion, hydrocephalus, or extra-axial fluid.   Generalized atrophy. Mild subcortical and periventricular T2 and FLAIR hyperintensities, likely chronic microvascular ischemic change. Flow voids are maintained in the carotid, basilar, and both distal vertebral arteries.  BILATERAL cataract extraction. Chronic sinus disease. No mastoid fluid. No osseous findings. No midline abnormality. Compared with prior CT, the infarcts are not visible on that study.  IMPRESSION: Multifocal areas of acute infarction affecting the cerebellum, LEFT paramedian pons, LEFT thalamus, and LEFT posterior parietal and occipital regions. Posterior circulation event is suspected.  Generalized atrophy and mild small vessel disease, not unexpected for age.  These results will be called to the ordering clinician or representative by the Radiologist Assistant, and communication documented in the PACS or zVision Dashboard.   Electronically Signed   By: Staci Righter M.D.   On: 02/17/2015 11:01    Assessment/Plan: Diagnosis: Right hemiparesis, Right hemiataxia and aphasia, bilateral infarcts affecting left thalamus left paramedian pons and right cerebellum 1. Does the need for close, 24 hr/day medical supervision in concert with the patient's rehab needs  make it unreasonable for this patient to be served in a less intensive setting? Yes 2. Co-Morbidities requiring supervision/potential complications: Diabetes, hypertension, adenocarcinoma of unknown primary with extraluminal Colon Metastasis 3. Due to bladder management, bowel management, safety, skin/wound care, disease management, medication administration, pain management and patient education, does the patient require 24 hr/day rehab nursing? Yes 4. Does the patient require coordinated care of a physician, rehab nurse, PT (1-2 hrs/day, 5 days/week), OT (1-2 hrs/day, 5 days/week) and SLP (0.5-1 hrs/day, 5 days/week) to address physical and functional deficits in the context of the above medical diagnosis(es)? Yes Addressing deficits  in the following areas: balance, endurance, locomotion, strength, transferring, bowel/bladder control, bathing, dressing, feeding, grooming, toileting, cognition, speech, language and psychosocial support 5. Can the patient actively participate in an intensive therapy program of at least 3 hrs of therapy per day at least 5 days per week? Yes 6. The potential for patient to make measurable gains while on inpatient rehab is good 7. Anticipated functional outcomes upon discharge from inpatient rehab are supervision  with PT, supervision with OT, supervision with SLP. 8. Estimated rehab length of stay to reach the above functional goals is: 12-16 days 9. Does the patient have adequate social supports and living environment to accommodate these discharge functional goals? Yes 10. Anticipated D/C setting: Home 11. Anticipated post D/C treatments: [place] therapy 12. Overall Rehab/Functional Prognosis: good  RECOMMENDATIONS: This patient's condition is appropriate for continued rehabilitative care in the following setting: CIR Patient has agreed to participate in recommended program. Yes Note that insurance prior authorization may be required for reimbursement for recommended care.  Comment: Will likely need at least assisted-living level at [facility], Would go home and then follow up with surgery as an outpatient    02/19/2015

## 2015-02-19 NOTE — Progress Notes (Signed)
Speech Language Pathology Treatment: Cognitive-Linquistic  Patient Details Name: Katherine Cooke MRN: [id number] DOB: 03-15-1926 Today's Date: 02/19/2015 Time: 1025-1100 SLP Time Calculation (min) (ACUTE ONLY): 35 min  Assessment / Plan / Recommendation Clinical Impression  Skilled treatment focused on expressive aphasia treatment. Patient's two sons were both present during the session, and both acknowledged that patient has improved significantly since initial admission. Patient was able to consistently answer biographical and factual yes/ no questions, and word-finding errors and perseveration on thought/word mainly occured when she was answering open-ending questions and in simple conversation. Patient would perseverate on word and demonstrated awareness to this, but unable to self-cue out of perseveration. SLP cued her by offering choices or possibilities of what she was trying to say. For example, patient stated that "I have bronchitis in my legs", clinician asked her, "did you mean arthritis?", which patient confirmed. Patient was able to effectively communicate her thoughts and respond to open-ended questions with minimal-moderate assistance, semantic and choice cues by SLP. She had particular difficulty effectively expressing her visual changes, but son stated that he was not suprised she was having difficulty, because she has had vision issues prior to this stroke, and he thinks she was getting 'mixed up' when trying to communicate this to SLP.   HPI Other Pertinent Information: 81 yo female adm to [facility] with colonic mass.  Pt found to have expressive aphasia, facial droop, vision changes, speech changes with concern for possible CVA.  Pt cva confirmed - per MRI -.  Multifocal areas of acute infarction affecting the cerebellum, LEFT paramedian pons, LEFT thalamus, and LEFT posterior parietal and occipital lobe.  Most recent CXR negative.  Swallow evaluation ordered.    Pertinent Vitals Pain  Assessment: No/denies pain  SLP Plan    continue with current plan of care     Dannial Monarch 02/19/2015, 5:07 PM   Sonia Baller, MA, CCC-SLP 02/19/2015 5:09 PM

## 2015-02-19 NOTE — Progress Notes (Signed)
I met with pt and her two sons at bedside. I explained that I do not have a bed available over the next several days and they are aware. Pt is from [place] at [facility]. Pt and family prefer inpt rehab rather than SNF at [facility]. I did discuss other inpt rehab facilities in [facility] and [place]. I will follow up Monday if pt remains in house. [phone]

## 2015-02-19 NOTE — Progress Notes (Signed)
STROKE TEAM PROGRESS NOTE   SUBJECTIVE (INTERVAL HISTORY) 2 sons at the bedside today. The patient appears much brighter and seems to be doing well. Inpatient rehabilitation plan. Lovenox therapy per pharmacy dosing initiated. CT chest no metastasis seen.    OBJECTIVE Temp:  [97.6 F (36.4 C)-98.4 F (36.9 C)] 97.7 F (36.5 C) (09/16 1016) Pulse Rate:  [76-98] 92 (09/16 1016) Cardiac Rhythm:  [-] Normal sinus rhythm (09/16 0752) Resp:  [20] 20 (09/16 1016) BP: (128-135)/(53-72) 135/69 mmHg (09/16 1016) SpO2:  [94 %-98 %] 98 % (09/16 1016)  CBC:   Recent Labs Lab 02/18/15 0718 02/19/15 0608  WBC 11.7* 12.7*  HGB 10.1* 11.3*  HCT 30.6* 33.1*  MCV 96.8 97.1  PLT 401* 432*    Basic Metabolic Panel:  Recent Labs Lab 02/14/15 0512  02/17/15 0413 02/18/15 0718  NA 132*  < > 136 133*  K 3.6  < > 3.5 3.3*  CL 100*  < > 106 102  CO2 26  < > 23 24  GLUCOSE 124*  < > 96 118*  BUN 20  < > <5* <5*  CREATININE 0.95  < > 0.60 0.69  CALCIUM 8.1*  < > 8.0* 8.2*  MG 2.1  --   --   --   PHOS 3.6  --   --   --   < > = values in this interval not displayed.  Lipid Panel:     Component Value Date/Time   CHOL 122 02/18/2015 0718   TRIG 88 02/18/2015 0718   HDL 41 02/18/2015 0718   CHOLHDL 3.0 02/18/2015 0718   VLDL 18 02/18/2015 0718   LDLCALC 63 02/18/2015 0718   HgbA1c: No results found for: HGBA1C Urine Drug Screen: No results found for: LABOPIA, COCAINSCRNUR, LABBENZ, AMPHETMU, THCU, LABBARB    IMAGING  I have personally reviewed the radiological images below and agree with the radiology interpretations.  CT chest with contrast 02/19/2015 1. No evidence of a primary malignancy or metastatic disease in the thorax. Specifically, no lung mass and no thyroid mass. 2. COPD/emphysema. Mild atelectasis in the lower lobes. Mucous plugging involving segmental right lower lobe bronchi. Small bilateral pleural effusions. No acute cardiopulmonary disease otherwise. 3. Moderate  cardiomegaly. Extensive mitral annular calcification. Mild aortic valvular calcification. Severe LAD coronary atherosclerosis.  Ct Angio Head and Neck W/cm &/or Wo Cm 02/17/2015    1. Acute/subacute infarcts of the left thalamus and right cerebellum.  2. Moderate stenoses at the origins of the vertebral arteries bilaterally, worse on the right.  3. Tortuosity of the vertebral arteries without other focal stenoses.  4. The posterior cerebral arteries are of fetal type with a small P1 segments.  5. Marked tortuosity of the common and internal carotid arteries bilaterally without significant stenoses.  6. Dense atherosclerotic calcifications at the carotid bifurcations bilaterally without significant stenosis.  7. Multilevel spondylosis of the cervical spine.     Dg Chest 1 View 02/17/2015    No active disease.     Ct Head Wo Contrast  02/17/2015    No acute intracranial findings.   Mr Brain Wo Contrast 02/17/2015    Multifocal areas of acute infarction affecting the cerebellum, LEFT paramedian pons, LEFT thalamus, and LEFT posterior parietal and occipital regions. Posterior circulation event is suspected.  Generalized atrophy and mild small vessel disease, not unexpected for age.    CT abdomen/pelvis - 1. Masslike soft tissue involving the sigmoid colon, most concerning for primary colonic malignancy with necrotic component  or less likely abscess. While there is extensive left-sided colonic diverticulosis, this appears more complex and masslike than typical perforated diverticulitis and abscess formation. 2. Small pelvic and retroperitoneal lymph nodes, indeterminate but could represent nodal metastatic disease. 3. 1.4 cm hypoattenuating liver lesion, indeterminate although a metastasis is not excluded. 4. Tiny right lower lobe lung nodules measuring 2-3 mm in size.  Colon biopsy - There are clusters of poorly differentiated malignant cells. The cells are located beneath benign  appearing colonic mucosa raising concern for a non-colon primary. Immunohistochemistry reveals the cells are positive for cytokeratin 7 (diffuse strong), TTF-1 (diffuse strong), and CD56 (diffuse strong), p53, and WT-1 (focal). There is only very weak focal staining with ER. The cells are negative for NapsinA, cytokeratin 20, CDX-2, chromogranin, synaptophysin, calretinin, and calcitonin. The strong cytokeratin 7 and TTF-1 staining raises the possibility of a lung or thyroid primary. Clinical correlation is recommended. Dr. Saralyn Pilar has reviewed the case. The case was called to Dr. Cristina Gong on 02/16/2015 and Dr. Julien Nordmann on 02/18/2015. Per request tissue will be sent for [facility].   PHYSICAL EXAM  Temp:  [97.6 F (36.4 C)-98.4 F (36.9 C)] 97.7 F (36.5 C) (09/16 1016) Pulse Rate:  [76-98] 92 (09/16 1016) Resp:  [20] 20 (09/16 1016) BP: (128-135)/(53-72) 135/69 mmHg (09/16 1016) SpO2:  [94 %-98 %] 98 % (09/16 1016)  General - Well nourished, well developed, in no apparent distress.  Ophthalmologic - Fundi not visualized due to small pupils and left ptosis.  Cardiovascular - Regular rate and rhythm with no murmur.  Mental Status -  Level of arousal and orientation to time, place, and person were intact. Language intact at reading, comprehension, repetition. On naming, pt named 4/6. On expression, pt does have paraphasic errors and frequent hesitation as well as mild non-fluency. Fund of Knowledge was assessed and was impaired.  Cranial Nerves II - XII - II - Visual field intact OU. III, IV, VI - Extraocular movements intact, no diplopia, but mild left ptosis, PERRL. V - Facial sensation intact bilaterally. VII - Facial movement showed mild right facial droop. VIII - Hearing & vestibular intact bilaterally. X - Palate elevates symmetrically. XI - Chin turning & shoulder shrug intact bilaterally. XII - Tongue protrusion intact.  Motor Strength - The patient's strength was 4+/5  in all extremities and pronator drift was absent.  Bulk was normal and fasciculations were absent.   Motor Tone - Muscle tone was assessed at the neck and appendages and was normal.  Reflexes - The patient's reflexes were 1+ in all extremities and she had no pathological reflexes.  Sensory - Light touch, temperature/pinprick were assessed and were symmetrical.    Coordination - The patient had normal movements in the hands with no ataxia or dysmetria.  Tremor was absent.  Gait and Station - deferred due to fatigue.   ASSESSMENT/PLAN Ms. Katherine Cooke is a 81 y.o. female with history of HTN, basal cell carcinoma, spinal stenosis, left lower quadrant abdominal pain of one week duration with preliminary biopsy results concerning for adenocarcinoma, presenting with left ptosis, right facial droop, and slurred speech. She did not receive IV t-PA due to out of window and initial deficits felt to be medication related.  Strokes:  embolic infarcts involving b/l cerebellar, pons, left thalamus, and left MCA/PCA territory, possibly secondary to hypercoagulable state from advance malignancy.  Resultant  - mild facial droop and aphasia, left ptosis  MRI - embolic infarcts involving b/l cerebellar, pons, left thalamus, and  left MCA/PCA territory.  CTA head and neck - unremarkable   Lower extremity Dopplers negative for DVT  2D Echo - EF 50-55%. No cardiac source of embolus identified.  LDL - 63  HgbA1c pending  VTE prophylaxis - Lovenox Diet clear liquid Room service appropriate?: Yes; Fluid consistency:: Thin  aspirin 81 mg orally every day prior to admission, now on aspirin 325 mg orally every day. Due to the concern for malignancy related hypercoagulable state, recommend lovenox therapeutic dose for anticoagulation. Pt infarcts are small in size, low risk for developing hemorrhagic transformation.   Patient counseled to be compliant with her antithrombotic medications  Ongoing aggressive  stroke risk factor management  Therapy recommendations: CIR recommended  Disposition:  Pending  Advanced malignancy  Newly diagnosed colon cancer  CT abdomen/pelvis showed likely liver metastasis  CT chest no metastases or primary tumor  Colon biopsy concerning for colon lesion being secondary and primary may be lung or thyroid  Discussed with Dr. Inda Merlin  Agree with lovenox for hypercoagulable state  Regarding surgical intervention, would like to recommend to hold off urgent procedure for 2 weeks, and elective procedure for 6 months. Avoid hypotension peri-operative periods.    Hypertension  Stable  Permissive hypertension (OK if < 220/120) but gradually normalize in 5-7 days  Other Stroke Risk Factors  Advanced age  ETOH use  Other Active Problems  Severe LAD coronary atherosclerosis by chest CT. Consider cardiology evaluation - especially if surgery is planned. Normal ECG. No TEE planned.   Hospital day # 6  Neurology will sign off. Please call with questions. Neurology follow-up as needed. Thanks for the consult.  Rosalin Hawking, MD PhD Stroke Neurology 02/19/2015 1:11 PM    To contact Stroke Continuity provider, please refer to [email or web address] After hours, contact General Neurology

## 2015-02-19 NOTE — Progress Notes (Signed)
Patient ID: Katherine Cooke, female   DOB: Mar 15, 1926, 81 y.o.   MRN: [id number] 4 Days Post-Op  Subjective: Pt feels ok today.  Less bloated because she had a good BM.  On clear liquids  Objective: Vital signs in last 24 hours: Temp:  [97.6 F (36.4 C)-98.4 F (36.9 C)] 98.4 F (36.9 C) (09/16 0522) Pulse Rate:  [76-98] 98 (09/16 0522) Resp:  [20] 20 (09/16 0522) BP: (127-133)/(53-72) 132/72 mmHg (09/16 0522) SpO2:  [94 %-98 %] 97 % (09/16 0522) Last BM Date: 02/17/15  Intake/Output from previous day:   Intake/Output this shift:    PE: Abd: soft, minimal bloating, +BS, NT Heart: regular LungS: CTAB  Lab Results:   Recent Labs  02/18/15 0718 02/19/15 0608  WBC 11.7* 12.7*  HGB 10.1* 11.3*  HCT 30.6* 33.1*  PLT 401* 432*   BMET  Recent Labs  02/17/15 0413 02/18/15 0718  NA 136 133*  K 3.5 3.3*  CL 106 102  CO2 23 24  GLUCOSE 96 118*  BUN <5* <5*  CREATININE 0.60 0.69  CALCIUM 8.0* 8.2*   PT/INR No results for input(s): LABPROT, INR in the last 72 hours. CMP     Component Value Date/Time   NA 133* 02/18/2015 0718   K 3.3* 02/18/2015 0718   CL 102 02/18/2015 0718   CO2 24 02/18/2015 0718   GLUCOSE 118* 02/18/2015 0718   BUN <5* 02/18/2015 0718   CREATININE 0.69 02/18/2015 0718   CALCIUM 8.2* 02/18/2015 0718   PROT 5.8* 02/14/2015 0512   ALBUMIN 2.5* 02/14/2015 0512   AST 38 02/14/2015 0512   ALT 13* 02/14/2015 0512   ALKPHOS 73 02/14/2015 0512   BILITOT 0.3 02/14/2015 0512   GFRNONAA >60 02/18/2015 0718   GFRAA >60 02/18/2015 0718   Lipase     Component Value Date/Time   LIPASE 18* 02/13/2015 1217       Studies/Results: Ct Angio Head W/cm &/or Wo Cm  02/17/2015   CLINICAL DATA:  Stroke with cerebral ischemia. Difficulty speaking. Recent diagnosis of colon cancer.  EXAM: CT ANGIOGRAPHY HEAD AND NECK  TECHNIQUE: Multidetector CT imaging of the head and neck was performed using the standard protocol during bolus administration of intravenous  contrast. Multiplanar CT image reconstructions and MIPs were obtained to evaluate the vascular anatomy. Carotid stenosis measurements (when applicable) are obtained utilizing NASCET criteria, using the distal internal carotid diameter as the denominator.  CONTRAST:  20m OMNIPAQUE IOHEXOL 350 MG/ML SOLN  COMPARISON:  MRI of the brain 02/17/2015.  FINDINGS: CT HEAD  Brain: The acute left thalamic infarct is less well seen by CT. Mild periventricular white matter changes are again noted bilaterally. No acute cortical infarct or hemorrhage is present. The ventricles are stable. No significant extra-axial fluid collection is present. Acute and remote lacunar infarcts are again noted in the cerebellum.  Calvarium and skull base: Within normal limits.  Paranasal sinuses: Cleared  Orbits: Bilateral lens replacements are noted. The globes and orbits are otherwise intact.  CTA NECK  Aortic arch: Atherosclerotic calcifications are present at the origins of the left common carotid artery and the left subclavian artery without significant stenosis. Minimal calcification is present along the undersurface of the aortic arch.  Right carotid system: Moderate tortuosity is present in the right common carotid artery without significant stenosis. Dense calcifications are present at the right carotid bifurcation. There is mild narrowing, less than 50% relative to the more distal vessel. The cervical right ICA is also tortuous without  significant stenosis.  Left carotid system: The left common carotid artery is tortuous without significant stenosis. Dense atherosclerotic calcifications are present at the carotid bifurcation without significant stenosis. There is significant tortuosity of the cervical left ICA without significant stenosis.  Vertebral arteries:A 50% stenosis is present in the right subclavian artery just beyond the bifurcation of the carotid artery. Dense calcifications are present at the origins of the vertebral arteries  bilaterally. Mild to moderate stenoses are worse on the right. There is significant tortuosity of the proximal vertebral arteries bilaterally without additional significant stenoses in the cervical vertebral arteries. The left vertebral artery is slightly dominant to the right.  Skeleton: Bone windows demonstrate multilevel endplate degenerative change. There is chronic loss of height at C5-6 and C6-7. Grade 1 anterolisthesis is degenerative at C3-4 and C4-5. No focal lytic or blastic lesions are present.  Other neck: Mild dependent atelectasis is present at the lung apices. No significant cervical adenopathy is present. Salivary glands are within normal limits. The thyroid is unremarkable.  CTA HEAD  Anterior circulation: There is mild ectasia of the internal carotid arteries bilaterally from the high cervical segments through the ICA termini. The A1 and M1 segments are within normal limits. The anterior communicating artery is patent. MCA bifurcations are intact bilaterally. There is some attenuation of distal MCA branch vessels bilaterally without significant proximal stenosis or occlusion.  Posterior circulation: The left vertebral artery is the dominant vessel. PICA origins are visualized and within normal limits. The basilar artery is within normal limits. Both posterior cerebral arteries are of fetal type with small contribution from the P1 segments.  Venous sinuses: The dural sinuses are patent. The straight sinus and deep cerebral veins are patent.  Anatomic variants: Bilateral fetal type posterior cerebral arteries with small P1 segments.  Delayed phase: No pathologic enhancement is present.  IMPRESSION: 1. Acute/subacute infarcts of the left thalamus and right cerebellum. 2. Moderate stenoses at the origins of the vertebral arteries bilaterally, worse on the right. 3. Tortuosity of the vertebral arteries without other focal stenoses. 4. The posterior cerebral arteries are of fetal type with a small P1  segments. 5. Marked tortuosity of the common and internal carotid arteries bilaterally without significant stenoses. 6. Dense atherosclerotic calcifications at the carotid bifurcations bilaterally without significant stenosis. 7. Multilevel spondylosis of the cervical spine.   Electronically Signed   By: San Morelle M.D.   On: 02/17/2015 18:34   Dg Chest 1 View  02/17/2015   CLINICAL DATA:  Delirium and weakness. 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 History of colon carcinoma.  EXAM: CHEST  1 VIEW  COMPARISON:  12/28/2010  FINDINGS: Cardiac silhouette is mildly enlarged. No mediastinal or hilar masses or evidence of adenopathy.  Lungs are clear.  No pleural effusion or pneumothorax.  Bony thorax is demineralized but grossly intact.  IMPRESSION: No active disease.   Electronically Signed   By: Lajean Manes M.D.   On: 02/17/2015 10:41   Ct Angio Neck W/cm &/or Wo/cm  02/17/2015   CLINICAL DATA:  Stroke with cerebral ischemia. Difficulty speaking. Recent diagnosis of colon cancer.  EXAM: CT ANGIOGRAPHY HEAD AND NECK  TECHNIQUE: Multidetector CT imaging of the head and neck was performed using the standard protocol during bolus administration of intravenous contrast. Multiplanar CT image reconstructions and MIPs were obtained to evaluate the vascular anatomy. Carotid stenosis measurements (when applicable) are obtained utilizing NASCET criteria, using the  distal internal carotid diameter as the denominator.  CONTRAST:  56m OMNIPAQUE IOHEXOL 350 MG/ML SOLN  COMPARISON:  MRI of the brain 02/17/2015.  FINDINGS: CT HEAD  Brain: The acute left thalamic infarct is less well seen by CT. Mild periventricular white matter changes are again noted bilaterally. No acute cortical infarct or hemorrhage is present. The ventricles are stable. No significant extra-axial fluid collection is present. Acute and remote lacunar infarcts are  again noted in the cerebellum.  Calvarium and skull base: Within normal limits.  Paranasal sinuses: Cleared  Orbits: Bilateral lens replacements are noted. The globes and orbits are otherwise intact.  CTA NECK  Aortic arch: Atherosclerotic calcifications are present at the origins of the left common carotid artery and the left subclavian artery without significant stenosis. Minimal calcification is present along the undersurface of the aortic arch.  Right carotid system: Moderate tortuosity is present in the right common carotid artery without significant stenosis. Dense calcifications are present at the right carotid bifurcation. There is mild narrowing, less than 50% relative to the more distal vessel. The cervical right ICA is also tortuous without significant stenosis.  Left carotid system: The left common carotid artery is tortuous without significant stenosis. Dense atherosclerotic calcifications are present at the carotid bifurcation without significant stenosis. There is significant tortuosity of the cervical left ICA without significant stenosis.  Vertebral arteries:A 50% stenosis is present in the right subclavian artery just beyond the bifurcation of the carotid artery. Dense calcifications are present at the origins of the vertebral arteries bilaterally. Mild to moderate stenoses are worse on the right. There is significant tortuosity of the proximal vertebral arteries bilaterally without additional significant stenoses in the cervical vertebral arteries. The left vertebral artery is slightly dominant to the right.  Skeleton: Bone windows demonstrate multilevel endplate degenerative change. There is chronic loss of height at C5-6 and C6-7. Grade 1 anterolisthesis is degenerative at C3-4 and C4-5. No focal lytic or blastic lesions are present.  Other neck: Mild dependent atelectasis is present at the lung apices. No significant cervical adenopathy is present. Salivary glands are within normal limits. The  thyroid is unremarkable.  CTA HEAD  Anterior circulation: There is mild ectasia of the internal carotid arteries bilaterally from the high cervical segments through the ICA termini. The A1 and M1 segments are within normal limits. The anterior communicating artery is patent. MCA bifurcations are intact bilaterally. There is some attenuation of distal MCA branch vessels bilaterally without significant proximal stenosis or occlusion.  Posterior circulation: The left vertebral artery is the dominant vessel. PICA origins are visualized and within normal limits. The basilar artery is within normal limits. Both posterior cerebral arteries are of fetal type with small contribution from the P1 segments.  Venous sinuses: The dural sinuses are patent. The straight sinus and deep cerebral veins are patent.  Anatomic variants: Bilateral fetal type posterior cerebral arteries with small P1 segments.  Delayed phase: No pathologic enhancement is present.  IMPRESSION: 1. Acute/subacute infarcts of the left thalamus and right cerebellum. 2. Moderate stenoses at the origins of the vertebral arteries bilaterally, worse on the right. 3. Tortuosity of the vertebral arteries without other focal stenoses. 4. The posterior cerebral arteries are of fetal type with a small P1 segments. 5. Marked tortuosity of the common and internal carotid arteries bilaterally without significant stenoses. 6. Dense atherosclerotic calcifications at the carotid bifurcations bilaterally without significant stenosis. 7. Multilevel spondylosis of the cervical spine.   Electronically Signed   By: CHarrell Gave  Mattern M.D.   On: 02/17/2015 18:34   Mr Brain Wo Contrast  02/17/2015   CLINICAL DATA:  Confusion with slurred speech and difficulty with word finding. Blurred vision. Symptoms began 02/16/2015.  EXAM: MRI HEAD WITHOUT CONTRAST  TECHNIQUE: Multiplanar, multiecho pulse sequences of the brain and surrounding structures were obtained without intravenous  contrast.  COMPARISON:  CT head 02/16/2015.  FINDINGS: Multifocal areas of restricted diffusion representing acute infarction are observed. These are located in multiple vascular territories. Subcentimeter BILATERAL cerebellar cortical and subcortical infarcts are seen. Linear LEFT paramedian acute pontine infarct. Scattered medial LEFT parietal and lateral LEFT occipital cortical and subcortical acute infarcts. Largest area of infarction affects the LEFT thalamus, 10 x 14 mm. All of these acute infarcts lie within the distribution of the BILATERAL vertebral arteries and basilar artery, and felt to represent posterior circulation event.  No acute or chronic hemorrhage, mass lesion, hydrocephalus, or extra-axial fluid.  Generalized atrophy. Mild subcortical and periventricular T2 and FLAIR hyperintensities, likely chronic microvascular ischemic change. Flow voids are maintained in the carotid, basilar, and both distal vertebral arteries.  BILATERAL cataract extraction. Chronic sinus disease. No mastoid fluid. No osseous findings. No midline abnormality. Compared with prior CT, the infarcts are not visible on that study.  IMPRESSION: Multifocal areas of acute infarction affecting the cerebellum, LEFT paramedian pons, LEFT thalamus, and LEFT posterior parietal and occipital regions. Posterior circulation event is suspected.  Generalized atrophy and mild small vessel disease, not unexpected for age.  These results will be called to the ordering clinician or representative by the Radiologist Assistant, and communication documented in the PACS or zVision Dashboard.   Electronically Signed   By: Staci Righter M.D.   On: 02/17/2015 11:01    Anti-infectives: Anti-infectives    Start     Dose/Rate Route Frequency Ordered Stop   02/18/15 1500  piperacillin-tazobactam (ZOSYN) IVPB 3.375 g  Status:  Discontinued     3.375 g 12.5 mL/hr over 240 Minutes Intravenous Every 8 hours 02/18/15 0836 02/18/15 1244   02/18/15 0830   piperacillin-tazobactam (ZOSYN) IVPB 3.375 g  Status:  Discontinued     3.375 g 100 mL/hr over 30 Minutes Intravenous  Once 02/18/15 0827 02/18/15 1244   02/15/15 1800  ciprofloxacin (CIPRO) IVPB 400 mg  Status:  Discontinued     400 mg 200 mL/hr over 60 Minutes Intravenous Every 12 hours 02/15/15 1629 02/18/15 0813   02/15/15 1700  metroNIDAZOLE (FLAGYL) IVPB 500 mg  Status:  Discontinued     500 mg 100 mL/hr over 60 Minutes Intravenous Every 8 hours 02/15/15 1629 02/18/15 0813   02/15/15 1200  amoxicillin-clavulanate (AUGMENTIN) 875-125 MG per tablet 1 tablet  Status:  Discontinued     1 tablet Oral Every 12 hours 02/15/15 1058 02/15/15 1629   02/14/15 2200  piperacillin-tazobactam (ZOSYN) IVPB 3.375 g  Status:  Discontinued     3.375 g 12.5 mL/hr over 240 Minutes Intravenous 3 times per day 02/14/15 2051 02/15/15 1105   02/13/15 1900  metroNIDAZOLE (FLAGYL) tablet 500 mg     500 mg Oral  Once 02/13/15 1847 02/13/15 1904   02/13/15 1900  ciprofloxacin (CIPRO) tablet 500 mg     500 mg Oral  Once 02/13/15 1847 02/13/15 1904       Assessment/Plan  Sigmoid colon carcinoma, 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 -patient is not completely obstructed.  Neurology's recommendations are urgent surgery in 2 weeks and elective surgery in 6 months.   -  patient can have CVA work up completed here in the hospital and rehab.  We will then have her follow up with Dr. Hassell Done in our office after she is discharged to set up surgery for either diversion or resection.   -we will sign off for now. CVA  -per neuro and primary service  LOS: 6 days    OSBORNE,KELLY E 02/19/2015, 7:56 AM Pager: [phone]

## 2015-02-19 NOTE — Progress Notes (Deleted)
[facility] PROGRESS NOTE  Katherine Cooke YBO:[id number] DOB: 15-Apr-1926 DOA: 02/13/2015 PCP: Idamae Schuller, MD    89?  hypertension,  spinal stenosis, admitted to the medicine service on 02/14/2015 presenting with complaints of lower abdominal pain. She reported having a small amount of blood morning of admission.   CT scan of abdomen and pelvis that showed masslike soft tissue involving the sigmoid colon that was concerning for malignancy. GI was consulted.  On 02/15/2015 she underwent sigmoidoscopy with biopsy. Pathology reported adenocarcinoma.  General surgery was consulted regarding possibility of surgical excision of mass.   02/16/2015 she had mental status change + right-sided facial droop with associated right-sided weakness.  MRI of the brain was ordered which unfortunately revealed multifocal areas of acute infarction affecting cerebellum, left pons, left thalamus and left posterior parietal and occipital regions. Dr. Armida Sans of neurology who recommended starting aspirin 325 mg by mouth every daily and transferring patient to [facility] for further evaluation and treatment.  ultimately felt she might have hypercoagulability 2/2 to CVA and started on lovenox [place] in setting of acute CVa would be HI RISK for immediate surgery/colectoym and i had a discussion in detail with the family regarding this and risks of surgery I asked Dr. Marin Olp to weigh in from an Oncology perspective   Mild confusion Otherwise fine Improved from yesterday. No c/o family bedside  Assessment/Plan: 1. Acute CVA. -9/13 = mental status changes. CT scan of brain did not show acute intracranial abnormalities however further workup with MRI of brain showed multifocal areas of acute infarct. -CT angiogram as above -Await results of transthoracic echocardiogram, carotid Dopplers, MRA, bilateral lower extremity venous Dopplers.  -neurology who recommended initiating aspirin 325 mg by mouth  daily  2.  Poorly diff Ca-Unclea rif Thyroid !ry-consider Familial adenomatous polyposis? -Patient diagnosed with adenocarcinoma affecting sigmoid during this hospitalization, undergoing a sigmoidoscopy on 02/15/2015 with biopsies of mass. Unfortunately this revealed poorly differentiated Ca -It is possible underlying malignancy led to a hypercoagulable state, ultimately leading to stroke of thromboembolic phenomenon. -She remains on ciprofloxacin and Flagyl given concerns for necrosis and infection--I would defer continued use of this general surgery and would narrowed to Zosyn -Given acute CVA surgery will need to be postponed -Gen surgery feels it is reasonable to feed patient as long as we can keep stools soft and i started a low fiber diet 9/16  3.  Hyponatremia. -Likely secondary to prerenal azotemia -Sodium improved from 133-136  4. Leukocytosis + mild thromobocytosis Unclear etiology--could be subclinical aspiration Afebrile currently  5.Hypertension. -Blood pressures are stable  6. AKI Initial creat 1.51 On saline-Continue and resolving to <5/0.69   Consultants:  Neurology  Gen surgery  GI Dr. Cristina Gong  Oncology Dr. Earlie Server   Antibiotics:  Ciprofloxacin  Flagyl  CTA head  1. Acute/subacute infarcts of the left thalamus and right cerebellum. 2. Moderate stenoses at the origins of the vertebral arteries bilaterally, worse on the right. 3. Tortuosity of the vertebral arteries without other focal stenoses. 4. The posterior cerebral arteries are of fetal type with a small P1 segments. 5. Marked tortuosity of the common and internal carotid arteries bilaterally without significant stenoses. 6. Dense atherosclerotic calcifications at the carotid bifurcations bilaterally without significant stenosis. 7. Multilevel spondylosis of the cervical spine.  Objective: Filed Vitals:   02/19/15 1506  BP: 136/57  Pulse: 64  Temp: 98.1 F (36.7 C)  Resp: 20   No  intake or output data in the 24 hours ending 02/19/15 1516  Filed Weights   02/13/15 2116 02/15/15 1448  Weight: 48.535 kg (107 lb) 46.72 kg (103 lb)    Exam:   General:  Ill-appearing, patient has less slurred speech.  she is awake and alert and can follow some commands  Cardiovascular: Regular rate and rhythm normal S1-S2 no murmurs rubs or gallops  Respiratory: Normal respiratory for, lungs are clear to auscultation bilaterally no wheezing rhonchi or rales  Abdomen: Patient having mild tenderness to palpation over lower, region  Neuur: facial droop improved-power intact.  vrbalizing well.  But a little confused  Data Reviewed: Basic Metabolic Panel:  Recent Labs Lab 02/14/15 0512 02/15/15 0404 02/16/15 0421 02/17/15 0413 02/18/15 0718  NA 132* 133* 133* 136 133*  K 3.6 3.8 3.7 3.5 3.3*  CL 100* 101 103 106 102  CO2 '26 26 24 23 24  '$ GLUCOSE 124* 107* 97 96 118*  BUN '20 11 7 '$ <5* <5*  CREATININE 0.95 0.82 0.72 0.60 0.69  CALCIUM 8.1* 8.2* 8.0* 8.0* 8.2*  MG 2.1  --   --   --   --   PHOS 3.6  --   --   --   --    Liver Function Tests:  Recent Labs Lab 02/13/15 1217 02/14/15 0512  AST 22 38  ALT 13* 13*  ALKPHOS 86 73  BILITOT 0.8 0.3  PROT 7.1 5.8*  ALBUMIN 3.1* 2.5*    Recent Labs Lab 02/13/15 1217  LIPASE 18*   No results for input(s): AMMONIA in the last 168 hours. CBC:  Recent Labs Lab 02/15/15 0404 02/16/15 0421 02/17/15 0413 02/18/15 0718 02/19/15 0608  WBC 12.0* 10.6* 12.2* 11.7* 12.7*  HGB 9.4* 9.4* 9.4* 10.1* 11.3*  HCT 28.2* 27.9* 28.2* 30.6* 33.1*  MCV 98.6 98.2 98.3 96.8 97.1  PLT 422* 397 404* 401* 432*   Cardiac Enzymes: No results for input(s): CKTOTAL, CKMB, CKMBINDEX, TROPONINI in the last 168 hours. BNP (last 3 results) No results for input(s): BNP in the last 8760 hours.  ProBNP (last 3 results) No results for input(s): PROBNP in the last 8760 hours.  CBG:  Recent Labs Lab 02/16/15 2243  GLUCAP 98    No results  found for this or any previous visit (from the past 240 hour(s)).   Studies: Ct Angio Head W/cm &/or Wo Cm  02/17/2015   CLINICAL DATA:  Stroke with cerebral ischemia. Difficulty speaking. Recent diagnosis of colon cancer.  EXAM: CT ANGIOGRAPHY HEAD AND NECK  TECHNIQUE: Multidetector CT imaging of the head and neck was performed using the standard protocol during bolus administration of intravenous contrast. Multiplanar CT image reconstructions and MIPs were obtained to evaluate the vascular anatomy. Carotid stenosis measurements (when applicable) are obtained utilizing NASCET criteria, using the distal internal carotid diameter as the denominator.  CONTRAST:  65m OMNIPAQUE IOHEXOL 350 MG/ML SOLN  COMPARISON:  MRI of the brain 02/17/2015.  FINDINGS: CT HEAD  Brain: The acute left thalamic infarct is less well seen by CT. Mild periventricular white matter changes are again noted bilaterally. No acute cortical infarct or hemorrhage is present. The ventricles are stable. No significant extra-axial fluid collection is present. Acute and remote lacunar infarcts are again noted in the cerebellum.  Calvarium and skull base: Within normal limits.  Paranasal sinuses: Cleared  Orbits: Bilateral lens replacements are noted. The globes and orbits are otherwise intact.  CTA NECK  Aortic arch: Atherosclerotic calcifications are present at the origins of the left common carotid artery and the left subclavian artery  without significant stenosis. Minimal calcification is present along the undersurface of the aortic arch.  Right carotid system: Moderate tortuosity is present in the right common carotid artery without significant stenosis. Dense calcifications are present at the right carotid bifurcation. There is mild narrowing, less than 50% relative to the more distal vessel. The cervical right ICA is also tortuous without significant stenosis.  Left carotid system: The left common carotid artery is tortuous without  significant stenosis. Dense atherosclerotic calcifications are present at the carotid bifurcation without significant stenosis. There is significant tortuosity of the cervical left ICA without significant stenosis.  Vertebral arteries:A 50% stenosis is present in the right subclavian artery just beyond the bifurcation of the carotid artery. Dense calcifications are present at the origins of the vertebral arteries bilaterally. Mild to moderate stenoses are worse on the right. There is significant tortuosity of the proximal vertebral arteries bilaterally without additional significant stenoses in the cervical vertebral arteries. The left vertebral artery is slightly dominant to the right.  Skeleton: Bone windows demonstrate multilevel endplate degenerative change. There is chronic loss of height at C5-6 and C6-7. Grade 1 anterolisthesis is degenerative at C3-4 and C4-5. No focal lytic or blastic lesions are present.  Other neck: Mild dependent atelectasis is present at the lung apices. No significant cervical adenopathy is present. Salivary glands are within normal limits. The thyroid is unremarkable.  CTA HEAD  Anterior circulation: There is mild ectasia of the internal carotid arteries bilaterally from the high cervical segments through the ICA termini. The A1 and M1 segments are within normal limits. The anterior communicating artery is patent. MCA bifurcations are intact bilaterally. There is some attenuation of distal MCA branch vessels bilaterally without significant proximal stenosis or occlusion.  Posterior circulation: The left vertebral artery is the dominant vessel. PICA origins are visualized and within normal limits. The basilar artery is within normal limits. Both posterior cerebral arteries are of fetal type with small contribution from the P1 segments.  Venous sinuses: The dural sinuses are patent. The straight sinus and deep cerebral veins are patent.  Anatomic variants: Bilateral fetal type posterior  cerebral arteries with small P1 segments.  Delayed phase: No pathologic enhancement is present.  IMPRESSION: 1. Acute/subacute infarcts of the left thalamus and right cerebellum. 2. Moderate stenoses at the origins of the vertebral arteries bilaterally, worse on the right. 3. Tortuosity of the vertebral arteries without other focal stenoses. 4. The posterior cerebral arteries are of fetal type with a small P1 segments. 5. Marked tortuosity of the common and internal carotid arteries bilaterally without significant stenoses. 6. Dense atherosclerotic calcifications at the carotid bifurcations bilaterally without significant stenosis. 7. Multilevel spondylosis of the cervical spine.   Electronically Signed   By: San Morelle M.D.   On: 02/17/2015 18:34   Ct Angio Neck W/cm &/or Wo/cm  02/17/2015   CLINICAL DATA:  Stroke with cerebral ischemia. Difficulty speaking. Recent diagnosis of colon cancer.  EXAM: CT ANGIOGRAPHY HEAD AND NECK  TECHNIQUE: Multidetector CT imaging of the head and neck was performed using the standard protocol during bolus administration of intravenous contrast. Multiplanar CT image reconstructions and MIPs were obtained to evaluate the vascular anatomy. Carotid stenosis measurements (when applicable) are obtained utilizing NASCET criteria, using the distal internal carotid diameter as the denominator.  CONTRAST:  5m OMNIPAQUE IOHEXOL 350 MG/ML SOLN  COMPARISON:  MRI of the brain 02/17/2015.  FINDINGS: CT HEAD  Brain: The acute left thalamic infarct is less well seen by CT. Mild periventricular  white matter changes are again noted bilaterally. No acute cortical infarct or hemorrhage is present. The ventricles are stable. No significant extra-axial fluid collection is present. Acute and remote lacunar infarcts are again noted in the cerebellum.  Calvarium and skull base: Within normal limits.  Paranasal sinuses: Cleared  Orbits: Bilateral lens replacements are noted. The globes and  orbits are otherwise intact.  CTA NECK  Aortic arch: Atherosclerotic calcifications are present at the origins of the left common carotid artery and the left subclavian artery without significant stenosis. Minimal calcification is present along the undersurface of the aortic arch.  Right carotid system: Moderate tortuosity is present in the right common carotid artery without significant stenosis. Dense calcifications are present at the right carotid bifurcation. There is mild narrowing, less than 50% relative to the more distal vessel. The cervical right ICA is also tortuous without significant stenosis.  Left carotid system: The left common carotid artery is tortuous without significant stenosis. Dense atherosclerotic calcifications are present at the carotid bifurcation without significant stenosis. There is significant tortuosity of the cervical left ICA without significant stenosis.  Vertebral arteries:A 50% stenosis is present in the right subclavian artery just beyond the bifurcation of the carotid artery. Dense calcifications are present at the origins of the vertebral arteries bilaterally. Mild to moderate stenoses are worse on the right. There is significant tortuosity of the proximal vertebral arteries bilaterally without additional significant stenoses in the cervical vertebral arteries. The left vertebral artery is slightly dominant to the right.  Skeleton: Bone windows demonstrate multilevel endplate degenerative change. There is chronic loss of height at C5-6 and C6-7. Grade 1 anterolisthesis is degenerative at C3-4 and C4-5. No focal lytic or blastic lesions are present.  Other neck: Mild dependent atelectasis is present at the lung apices. No significant cervical adenopathy is present. Salivary glands are within normal limits. The thyroid is unremarkable.  CTA HEAD  Anterior circulation: There is mild ectasia of the internal carotid arteries bilaterally from the high cervical segments through the  ICA termini. The A1 and M1 segments are within normal limits. The anterior communicating artery is patent. MCA bifurcations are intact bilaterally. There is some attenuation of distal MCA branch vessels bilaterally without significant proximal stenosis or occlusion.  Posterior circulation: The left vertebral artery is the dominant vessel. PICA origins are visualized and within normal limits. The basilar artery is within normal limits. Both posterior cerebral arteries are of fetal type with small contribution from the P1 segments.  Venous sinuses: The dural sinuses are patent. The straight sinus and deep cerebral veins are patent.  Anatomic variants: Bilateral fetal type posterior cerebral arteries with small P1 segments.  Delayed phase: No pathologic enhancement is present.  IMPRESSION: 1. Acute/subacute infarcts of the left thalamus and right cerebellum. 2. Moderate stenoses at the origins of the vertebral arteries bilaterally, worse on the right. 3. Tortuosity of the vertebral arteries without other focal stenoses. 4. The posterior cerebral arteries are of fetal type with a small P1 segments. 5. Marked tortuosity of the common and internal carotid arteries bilaterally without significant stenoses. 6. Dense atherosclerotic calcifications at the carotid bifurcations bilaterally without significant stenosis. 7. Multilevel spondylosis of the cervical spine.   Electronically Signed   By: San Morelle M.D.   On: 02/17/2015 18:34   Ct Chest W Contrast  02/19/2015   CLINICAL DATA:  81 year old who presented this admission with abdominal pain a constipation, found to have a soft tissue mass in the pelvis associated with the  sigmoid colon, pathology demonstrating poorly differentiated adenocarcinoma with possible lung or thyroid primary. Staging CT to evaluate for possible primary lesion or metastatic disease.  EXAM: CT CHEST WITH CONTRAST  TECHNIQUE: Multidetector CT imaging of the chest was performed during  intravenous contrast administration.  CONTRAST:  75 ml Omnipaque 300 IV.  COMPARISON:  No prior chest CT.  FINDINGS: Lungs: No pulmonary parenchymal nodules or masses to suggest primary bronchogenic carcinoma or metastatic disease. Emphysematous changes throughout both lungs. Mild atelectasis involving the lower lobes. No confluent airspace consolidation. No evidence of interstitial lung disease.  Trachea/bronchi: Trachea and mainstem bronchi patent. Calcified tracheobronchial cartilages. Mucous plugging involving the right lower lobe segmental bronchi. No endobronchial soft tissue mass.  Pleura:  Small bilateral pleural effusions.  No pleural masses.  Mediastinum:  No mediastinal masses.  Heart and Vascular: Heart moderately enlarged. Extensive mitral annular calcification. Mild aortic valvular calcification. Severe LAD coronary atherosclerosis. No pericardial effusion. Moderate to severe atherosclerosis involving the thoracic and upper abdominal aorta without evidence of aneurysm or dissection.  Lymphatic: No pathologic mediastinal, hilar or axillary lymphadenopathy.  Other findings: None.  Visualized lower neck: Thyroid gland normal in appearance. No supraclavicular lymphadenopathy.  Visualized upper abdomen: No new finding since the CT abdomen and pelvis 02/13/2015.  Musculoskeletal: No evidence of osseous metastatic disease. Degenerative disc disease and spondylosis throughout the thoracic spine.  IMPRESSION: 1. No evidence of a primary malignancy or metastatic disease in the thorax. Specifically, no lung mass and no thyroid mass. 2. COPD/emphysema. Mild atelectasis in the lower lobes. Mucous plugging involving segmental right lower lobe bronchi. Small bilateral pleural effusions. No acute cardiopulmonary disease otherwise. 3. Moderate cardiomegaly. Extensive mitral annular calcification. Mild aortic valvular calcification. Severe LAD coronary atherosclerosis.   Electronically Signed   By: Evangeline Dakin M.D.    On: 02/19/2015 10:24    Scheduled Meds: . aspirin  325 mg Oral Daily  . cycloSPORINE  1 drop Both Eyes BID  . docusate sodium  200 mg Oral QHS  . enoxaparin (LOVENOX) injection  1 mg/kg Subcutaneous Q24H  . feeding supplement  1 Container Oral TID BM  . montelukast  10 mg Oral QHS  . potassium chloride  40 mEq Oral Daily  . saccharomyces boulardii  250 mg Oral BID   Continuous Infusions: . sodium chloride 75 mL/hr at 02/17/15 1856    Principal Problem:   Colonic mass Active Problems:   Hypertension   Osteopenia   Leukocytosis   Anemia of chronic disease   CVA (cerebral infarction)   Stroke with cerebral ischemia   Malignancy    Time spent: 20 min Verneita Griffes, MD [facility] (P) [phone]

## 2015-02-19 NOTE — Clinical Social Work Note (Signed)
Clinical Social Work Assessment  Patient Details  Name: Katherine Cooke MRN: [id number] Date of Birth: 05-Jul-1925  Date of referral:  02/19/15               Reason for consult:  Discharge Planning, Facility Placement                Permission sought to share information with:  Case Manager, Family Supports, Customer service manager Permission granted to share information::  Yes, Verbal Permission Granted  Name::     Shanon Brow and [place]::   (SNF, [facility])  Relationship::  Sons  Contact Information:   [phone])  Housing/Transportation Living arrangements for the past 2 months:  [place] of Information:  Adult Children Patient Interpreter Needed:  None Criminal Activity/Legal Involvement Pertinent to Current Situation/Hospitalization:  No - Comment as needed Significant Relationships:  Adult Children Lives with:  Self Do you feel safe going back to the place where you live?  Yes Need for family participation in patient care:  Yes (Comment)  Care giving concerns:  Patient requiring short-term rehab and 24 hour supervision/assistance.    Social Worker assessment / plan:  Holiday representative met with patient and son, Shanon Brow present at bedside in reference to discharge planning. CSW introduced CSW role and SNF process. [name] son confirmed pt is a resident of the [facility]. Patient was being considered for IP REHAB however per rehab coordinator bed will NOT be available over the weekend. [name] family (sons and daughter) strongly prefers for an [place] setting. CSW explained SNF placement and services provided. [name] dtr stated pt has cancer and need to be stronger before undergoing surgery which is why they would like patient transitioned to a more intense rehab setting. CSW informed RNCM. [name] family currently not interested in pursuing SNF placement at this time. CSW spoke with admissions coordinator at [facility] who indicated pt  is welcome to return as short-term SNF resident however would need per-auth before pt's return if discharged over the weekend. CSW will hold off on sending pt's clinicals to [place]. No further concerns reported by family at this time. CSW will continue to follow patient and pt's family for continued support and to facilitate pt's discharge needs once medically stable.   Employment status:  Retired Nurse, adult PT Recommendations:  Inpatient [place] / Referral to community resources:  [place]  Patient/Family's Response to care:  Pt alert and disoriented. [name] family with strong preference for IP REHAB setting whether at [facility] or another hospital. RNCM notified and plans to make referral to [facility]. [name] family involved in care and supportive at bedside. Pt and pt's family pleasant and appreciated social work itnervention.  Patient/Family's Understanding of and Emotional Response to Diagnosis, Current Treatment, and Prognosis:  Patient's son knowledgeable of MRI being positive for stroke. [name] son also understanding of continued medical intervention and treatment. CSW remains available as needed.   Emotional Assessment Appearance:  Appears stated age Attitude/Demeanor/Rapport:  Unable to Assess Affect (typically observed):  Unable to Assess Orientation:  Oriented to Self Alcohol / Substance use:  Not Applicable Psych involvement (Current and /or in the community):  No (Comment)  Discharge Needs  Concerns to be addressed:  Care Coordination Readmission within the last 30 days:  No Current discharge risk:  Dependent with Mobility Barriers to Discharge:  Continued Medical Work up   [employer], MSW, [place] [phone] 02/19/2015  2:38 PM

## 2015-02-20 LAB — COMPREHENSIVE METABOLIC PANEL
ALBUMIN: 2.2 g/dL — AB (ref 3.5–5.0)
ALT: 12 U/L — ABNORMAL LOW (ref 14–54)
ANION GAP: 9 (ref 5–15)
AST: 39 U/L (ref 15–41)
Alkaline Phosphatase: 65 U/L (ref 38–126)
BUN: <5 mg/dL — ABNORMAL LOW (ref 6–20)
CHLORIDE: 102 mmol/L (ref 101–111)
CO2: 24 mmol/L (ref 22–32)
Calcium: 8.4 mg/dL — ABNORMAL LOW (ref 8.9–10.3)
Creatinine, Ser: 0.61 mg/dL (ref 0.44–1.00)
GFR calc Af Amer: >60 mL/min (ref >60)
GFR calc non Af Amer: >60 mL/min (ref >60)
GLUCOSE: 96 mg/dL (ref 65–99)
POTASSIUM: 4.1 mmol/L (ref 3.5–5.1)
SODIUM: 135 mmol/L (ref 135–145)
Total Bilirubin: 0.3 mg/dL (ref 0.3–1.2)
Total Protein: 5.7 g/dL — ABNORMAL LOW (ref 6.5–8.1)

## 2015-02-20 LAB — CBC WITH DIFFERENTIAL/PLATELET
Basophils Absolute: 0.1 10*3/uL (ref 0.0–0.1)
Basophils Relative: 1 %
Eosinophils Absolute: 0.4 10*3/uL (ref 0.0–0.7)
Eosinophils Relative: 3 %
HEMATOCRIT: 30.9 % — AB (ref 36.0–46.0)
HEMOGLOBIN: 10.6 g/dL — AB (ref 12.0–15.0)
LYMPHS PCT: 15 %
Lymphs Abs: 1.9 10*3/uL (ref 0.7–4.0)
MCH: 33.3 pg (ref 26.0–34.0)
MCHC: 34.3 g/dL (ref 30.0–36.0)
MCV: 97.2 fL (ref 78.0–100.0)
MONO ABS: 0.8 10*3/uL (ref 0.1–1.0)
MONOS PCT: 7 %
NEUTROS ABS: 9.4 10*3/uL — AB (ref 1.7–7.7)
NEUTROS PCT: 74 %
Platelets: 405 10*3/uL — ABNORMAL HIGH (ref 150–400)
RBC: 3.18 MIL/uL — ABNORMAL LOW (ref 3.87–5.11)
RDW: 13 % (ref 11.5–15.5)
WBC: 12.6 10*3/uL — ABNORMAL HIGH (ref 4.0–10.5)

## 2015-02-20 LAB — CREATININE, SERUM: CREATININE: 0.63 mg/dL (ref 0.44–1.00)

## 2015-02-20 NOTE — Progress Notes (Signed)
[facility] PROGRESS NOTE  Katherine Cooke GQQ:[id number] DOB: 1925/07/26 DOA: 02/13/2015 PCP: Idamae Schuller, MD    89?  hypertension,  spinal stenosis, admitted to the medicine service on 02/14/2015 presenting with complaints of lower abdominal pain. She reported having a small amount of blood morning of admission.   CT scan of abdomen and pelvis that showed masslike soft tissue involving the sigmoid colon that was concerning for malignancy. GI was consulted.  On 02/15/2015 she underwent sigmoidoscopy with biopsy. Pathology reported adenocarcinoma.  General surgery was consulted regarding possibility of surgical excision of mass.   02/16/2015 she had mental status change + right-sided facial droop with associated right-sided weakness.  MRI of the brain was ordered which unfortunately revealed multifocal areas of acute infarction affecting cerebellum, left pons, left thalamus and left posterior parietal and occipital regions. Dr. Armida Sans of neurology who recommended starting aspirin 325 mg by mouth every daily and transferring patient to [facility] for further evaluation and treatment.  ultimately felt she might have hypercoagulability 2/2 to CVA and started on lovenox Felt in setting of acute CVa would be HI RISK for immediate surgery/colectoym and i had a discussion in detail with the family regarding this and risks of surgery I asked Dr. Marin Olp to weigh in from an Oncology perspective   Doing fair  Some conflabuatio of words per son earlier which is now resolved.  No abd pain tol soft diet fairly well No loos stool No fever nor chills  Assessment/Plan: 1. Acute CVA. -9/13 = mental status changes. CT scan of brain did not show acute intracranial abnormalities however further workup with MRI of brain showed multifocal areas of acute infarct. -CT angiogram as above -results of transthoracic echocardiogram, carotid Dopplers, MRA, bilateral lower extremity venous  Dopplers are all neg -neurology who recommended initiating aspirin 325 mg by mouth daily --It is possible underlying malignancy led to a hypercoagulable state, ultimately leading to stroke of thromboembolic phenomenon. -due to hypercoagulable state also on lovenox 45 U daily which she will contuinue ongoing  2.  Poorly diff Ca-Unclear if Thyroid -Patient diagnosed with adenocarcinoma affecting sigmoid during this hospitalization, undergoing a sigmoidoscopy on 02/15/2015 with biopsies of mass. Unfortunately this revealed poorly differentiated Ca -She remains on ciprofloxacin and Flagyl given concerns for necrosis and infection--I would defer continued use of this general surgery who d/c Abx on 9/14 -Given acute CVA surgery will need to be postponed -Gen surgery feels it is reasonable to feed patient as long as we can keep stools soft and i started a low fiber diet 9/16 -She will need OP follow up for consideration of surgeyr beyond 2 weeks from time of acute CVA -if symptoms become severe such as obstruction would then need amore emergent palliatve type procedure  3.  Hyponatremia. -Likely secondary to prerenal azotemia -Sodium improved from 133-136  4. Leukocytosis + mild thromobocytosis Unclear etiology--could be subclinical aspiration Afebrile currently abx d/c 9/13  5.Hypertension. -Blood pressures are stable  6. AKI Initial creat 1.51 On saline-Continue and resolving to <5/0.69   Consultants:  Neurology  Gen surgery  GI Dr. Cristina Gong  Oncology Dr. Earlie Server   Antibiotics:  Ciprofloxacin  Flagyl  CTA head  1. Acute/subacute infarcts of the left thalamus and right cerebellum. 2. Moderate stenoses at the origins of the vertebral arteries bilaterally, worse on the right. 3. Tortuosity of the vertebral arteries without other focal stenoses. 4. The posterior cerebral arteries are of fetal type with a small P1 segments. 5. Marked tortuosity of  the common and internal  carotid arteries bilaterally without significant stenoses. 6. Dense atherosclerotic calcifications at the carotid bifurcations bilaterally without significant stenosis. 7. Multilevel spondylosis of the cervical spine.  Objective: Filed Vitals:   02/20/15 1401  BP: 132/55  Pulse: 88  Temp: 97.7 F (36.5 C)  Resp: 18    Intake/Output Summary (Last 24 hours) at 02/20/15 1632 Last data filed at 02/20/15 0900  Gross per 24 hour  Intake    120 ml  Output      0 ml  Net    120 ml   Filed Weights   02/13/15 2116 02/15/15 1448  Weight: 48.535 kg (107 lb) 46.72 kg (103 lb)    Exam:   General: fairt looks better  less slurred speech.  she is awake and alert and can follow some commands  Cardiovascular: Regular rate and rhythm normal S1-S2 no murmurs rubs or gallops  Respiratory: Normal respiratory for, lungs are clear to auscultation bilaterally no wheezing rhonchi or rales  Abdomen: nt nd  Neuur: facial droop improved-power intact.  vrbalizing well.  less confused.  Power is intact but she appears tired  Data Reviewed: Basic Metabolic Panel:  Recent Labs Lab 02/14/15 0512 02/15/15 0404 02/16/15 0421 02/17/15 0413 02/18/15 0718 02/20/15 0617 02/20/15 0944  NA 132* 133* 133* 136 133*  --  135  K 3.6 3.8 3.7 3.5 3.3*  --  4.1  CL 100* 101 103 106 102  --  102  CO2 '26 26 24 23 24  '$ --  24  GLUCOSE 124* 107* 97 96 118*  --  96  BUN '20 11 7 '$ <5* <5*  --  <5*  CREATININE 0.95 0.82 0.72 0.60 0.69 0.63 0.61  CALCIUM 8.1* 8.2* 8.0* 8.0* 8.2*  --  8.4*  MG 2.1  --   --   --   --   --   --   PHOS 3.6  --   --   --   --   --   --    Liver Function Tests:  Recent Labs Lab 02/14/15 0512 02/20/15 0944  AST 38 39  ALT 13* 12*  ALKPHOS 73 65  BILITOT 0.3 0.3  PROT 5.8* 5.7*  ALBUMIN 2.5* 2.2*   No results for input(s): LIPASE, AMYLASE in the last 168 hours. No results for input(s): AMMONIA in the last 168 hours. CBC:  Recent Labs Lab 02/16/15 0421 02/17/15 0413  02/18/15 0718 02/19/15 0608 02/20/15 0944  WBC 10.6* 12.2* 11.7* 12.7* 12.6*  NEUTROABS  --   --   --   --  9.4*  HGB 9.4* 9.4* 10.1* 11.3* 10.6*  HCT 27.9* 28.2* 30.6* 33.1* 30.9*  MCV 98.2 98.3 96.8 97.1 97.2  PLT 397 404* 401* 432* 405*   Cardiac Enzymes: No results for input(s): CKTOTAL, CKMB, CKMBINDEX, TROPONINI in the last 168 hours. BNP (last 3 results) No results for input(s): BNP in the last 8760 hours.  ProBNP (last 3 results) No results for input(s): PROBNP in the last 8760 hours.  CBG:  Recent Labs Lab 02/16/15 2243  GLUCAP 98    No results found for this or any previous visit (from the past 240 hour(s)).   Studies: Ct Chest W Contrast  02/19/2015   CLINICAL DATA:  81 year old who presented this admission with abdominal pain a constipation, found to have a soft tissue mass in the pelvis associated with the sigmoid colon, pathology demonstrating poorly differentiated adenocarcinoma with possible lung or thyroid primary. Staging CT  to evaluate for possible primary lesion or metastatic disease.  EXAM: CT CHEST WITH CONTRAST  TECHNIQUE: Multidetector CT imaging of the chest was performed during intravenous contrast administration.  CONTRAST:  75 ml Omnipaque 300 IV.  COMPARISON:  No prior chest CT.  FINDINGS: Lungs: No pulmonary parenchymal nodules or masses to suggest primary bronchogenic carcinoma or metastatic disease. Emphysematous changes throughout both lungs. Mild atelectasis involving the lower lobes. No confluent airspace consolidation. No evidence of interstitial lung disease.  Trachea/bronchi: Trachea and mainstem bronchi patent. Calcified tracheobronchial cartilages. Mucous plugging involving the right lower lobe segmental bronchi. No endobronchial soft tissue mass.  Pleura:  Small bilateral pleural effusions.  No pleural masses.  Mediastinum:  No mediastinal masses.  Heart and Vascular: Heart moderately enlarged. Extensive mitral annular calcification. Mild aortic  valvular calcification. Severe LAD coronary atherosclerosis. No pericardial effusion. Moderate to severe atherosclerosis involving the thoracic and upper abdominal aorta without evidence of aneurysm or dissection.  Lymphatic: No pathologic mediastinal, hilar or axillary lymphadenopathy.  Other findings: None.  Visualized lower neck: Thyroid gland normal in appearance. No supraclavicular lymphadenopathy.  Visualized upper abdomen: No new finding since the CT abdomen and pelvis 02/13/2015.  Musculoskeletal: No evidence of osseous metastatic disease. Degenerative disc disease and spondylosis throughout the thoracic spine.  IMPRESSION: 1. No evidence of a primary malignancy or metastatic disease in the thorax. Specifically, no lung mass and no thyroid mass. 2. COPD/emphysema. Mild atelectasis in the lower lobes. Mucous plugging involving segmental right lower lobe bronchi. Small bilateral pleural effusions. No acute cardiopulmonary disease otherwise. 3. Moderate cardiomegaly. Extensive mitral annular calcification. Mild aortic valvular calcification. Severe LAD coronary atherosclerosis.   Electronically Signed   By: Evangeline Dakin M.D.   On: 02/19/2015 10:24    Scheduled Meds: . aspirin  325 mg Oral Daily  . cycloSPORINE  1 drop Both Eyes BID  . docusate sodium  200 mg Oral QHS  . enoxaparin (LOVENOX) injection  1 mg/kg Subcutaneous Q24H  . feeding supplement (ENSURE ENLIVE)  237 mL Oral BID BM  . montelukast  10 mg Oral QHS  . polyethylene glycol  17 g Oral Daily  . potassium chloride  40 mEq Oral Daily  . saccharomyces boulardii  250 mg Oral BID   Continuous Infusions: . sodium chloride 75 mL/hr at 02/17/15 1856    Principal Problem:   Colonic mass Active Problems:   Hypertension   Osteopenia   Leukocytosis   Anemia of chronic disease   CVA (cerebral infarction)   Stroke with cerebral ischemia   Malignancy    Time spent: 20 min Verneita Griffes, MD [facility])  [phone]

## 2015-02-21 NOTE — Progress Notes (Signed)
[facility] PROGRESS NOTE  KEYSHIA ORWICK CLE:[id number] DOB: March 13, 1926 DOA: 02/13/2015 PCP: Idamae Schuller, MD    81  hypertension,  spinal stenosis, admitted to the medicine service on 02/14/2015 presenting with complaints of lower abdominal pain. She reported having a small amount of blood morning of admission.   CT scan of abdomen and pelvis that showed masslike soft tissue involving the sigmoid colon that was concerning for malignancy. GI was consulted.  On 02/15/2015 she underwent sigmoidoscopy with biopsy. Pathology reported adenocarcinoma.  General surgery was consulted regarding possibility of surgical excision of mass.   02/16/2015 she had mental status change + right-sided facial droop with associated right-sided weakness.  MRI of the brain was ordered which unfortunately revealed multifocal areas of acute infarction affecting cerebellum, left pons, left thalamus and left posterior parietal and occipital regions. Dr. Armida Sans of neurology who recommended starting aspirin 325 mg by mouth every daily and transferring patient to [facility] for further evaluation and treatment.  ultimately felt she might have hypercoagulability 2/2 to CVA and started on lovenox Weight based andd ASA 325 Felt in setting of acute CVa would be HI RISK for immediate surgery/colectoym and i had a discussion in detail with the family regarding this and risks of surgery I asked Dr. Marin Olp to weigh in from an Oncology perspective and he agreed best option was to hold off on surgery for now   Doing fair Had some LBP yesterday Still some double vision and some wor-salad and conflabation and is a little concerned about it No cp/sob Felt a little warm subj yesterday but Tmax not elevated.  No other issues  Assessment/Plan: 1. Acute CVA. -9/13 = mental status changes. CT scan of brain did not show acute intracranial abnormalities however further workup with MRI of brain showed multifocal  areas of acute infarct. -CT angiogram as above -results of transthoracic echocardiogram, carotid Dopplers, MRA, bilateral lower extremity venous Dopplers are all neg -neurology who recommended initiating aspirin 325 mg by mouth daily --It is possible underlying malignancy led to a hypercoagulable state, ultimately leading to stroke of thromboembolic phenomenon. -due to hypercoagulable state also on lovenox 45 U daily which she will contuinue ongoing  2.  Poorly diff Ca-Unclear if Thyroid -Patient diagnosed with adenocarcinoma affecting sigmoid during this hospitalization, undergoing a sigmoidoscopy on 02/15/2015 with biopsies of mass. Unfortunately this revealed poorly differentiated Ca -transiently on ciprofloxacin and Flagyl given concerns for necrosis and infection- general surgery d/c Abx on 9/14 -Given acute CVA surgery will need to be postponed -Gen surgery feels it is reasonable to feed patient as long as we can keep stools soft and i started a low fiber diet 9/16 and is passing stool comfortably -She will need OP follow up for consideration of surgery beyond 2 weeks from time of acute CVA  3.  Hyponatremia. -Likely secondary to prerenal azotemia -Sodium improved from 133-136  4. Leukocytosis + mild thromobocytosis Unclear etiology--could be subclinical aspiration Afebrile currently abx d/c 9/13  5.Hypertension. -Blood pressures are stable  6. AKI Initial creat 1.51 On saline-Continue and resolving to <5/0.69   Consultants:  Neurology  Gen surgery  GI Dr. Cristina Gong  Oncology Dr. Earlie Server   Antibiotics:  Ciprofloxacin  Flagyl  CTA head  1. Acute/subacute infarcts of the left thalamus and right cerebellum. 2. Moderate stenoses at the origins of the vertebral arteries bilaterally, worse on the right. 3. Tortuosity of the vertebral arteries without other focal stenoses. 4. The posterior cerebral arteries are of fetal type with  a small P1 segments. 5. Marked  tortuosity of the common and internal carotid arteries bilaterally without significant stenoses. 6. Dense atherosclerotic calcifications at the carotid bifurcations bilaterally without significant stenosis. 7. Multilevel spondylosis of the cervical spine.  Objective: Filed Vitals:   02/21/15 0603  BP: 141/59  Pulse: 98  Temp: 97.8 F (36.6 C)  Resp: 20    Intake/Output Summary (Last 24 hours) at 02/21/15 0900 Last data filed at 02/20/15 1838  Gross per 24 hour  Intake    120 ml  Output      0 ml  Net    120 ml   Filed Weights   02/13/15 2116 02/15/15 1448  Weight: 48.535 kg (107 lb) 46.72 kg (103 lb)    Exam:   General: fair looks better  less slurred speech.  she is awake and alert and can follow some commands  Cardiovascular: Regular rate and rhythm normal S1-S2 no murmurs rubs or gallops  Respiratory: Normal respiratory for, lungs are clear to auscultation bilaterally no wheezing rhonchi or rales  Abdomen: nt nd  Neuur: facial droop improved-power intact.  vrbalizing well.  less confused.  Power is intact but she appears tired  Data Reviewed: Basic Metabolic Panel:  Recent Labs Lab 02/15/15 0404 02/16/15 0421 02/17/15 0413 02/18/15 0718 02/20/15 0617 02/20/15 0944  NA 133* 133* 136 133*  --  135  K 3.8 3.7 3.5 3.3*  --  4.1  CL 101 103 106 102  --  102  CO2 '26 24 23 24  '$ --  24  GLUCOSE 107* 97 96 118*  --  96  BUN 11 7 <5* <5*  --  <5*  CREATININE 0.82 0.72 0.60 0.69 0.63 0.61  CALCIUM 8.2* 8.0* 8.0* 8.2*  --  8.4*   Liver Function Tests:  Recent Labs Lab 02/20/15 0944  AST 39  ALT 12*  ALKPHOS 65  BILITOT 0.3  PROT 5.7*  ALBUMIN 2.2*   No results for input(s): LIPASE, AMYLASE in the last 168 hours. No results for input(s): AMMONIA in the last 168 hours. CBC:  Recent Labs Lab 02/16/15 0421 02/17/15 0413 02/18/15 0718 02/19/15 0608 02/20/15 0944  WBC 10.6* 12.2* 11.7* 12.7* 12.6*  NEUTROABS  --   --   --   --  9.4*  HGB 9.4* 9.4*  10.1* 11.3* 10.6*  HCT 27.9* 28.2* 30.6* 33.1* 30.9*  MCV 98.2 98.3 96.8 97.1 97.2  PLT 397 404* 401* 432* 405*   Cardiac Enzymes: No results for input(s): CKTOTAL, CKMB, CKMBINDEX, TROPONINI in the last 168 hours. BNP (last 3 results) No results for input(s): BNP in the last 8760 hours.  ProBNP (last 3 results) No results for input(s): PROBNP in the last 8760 hours.  CBG:  Recent Labs Lab 02/16/15 2243  GLUCAP 98    No results found for this or any previous visit (from the past 240 hour(s)).   Studies: Ct Chest W Contrast  02/19/2015   CLINICAL DATA:  81 year old who presented this admission with abdominal pain a constipation, found to have a soft tissue mass in the pelvis associated with the sigmoid colon, pathology demonstrating poorly differentiated adenocarcinoma with possible lung or thyroid primary. Staging CT to evaluate for possible primary lesion or metastatic disease.  EXAM: CT CHEST WITH CONTRAST  TECHNIQUE: Multidetector CT imaging of the chest was performed during intravenous contrast administration.  CONTRAST:  75 ml Omnipaque 300 IV.  COMPARISON:  No prior chest CT.  FINDINGS: Lungs: No pulmonary parenchymal nodules or  masses to suggest primary bronchogenic carcinoma or metastatic disease. Emphysematous changes throughout both lungs. Mild atelectasis involving the lower lobes. No confluent airspace consolidation. No evidence of interstitial lung disease.  Trachea/bronchi: Trachea and mainstem bronchi patent. Calcified tracheobronchial cartilages. Mucous plugging involving the right lower lobe segmental bronchi. No endobronchial soft tissue mass.  Pleura:  Small bilateral pleural effusions.  No pleural masses.  Mediastinum:  No mediastinal masses.  Heart and Vascular: Heart moderately enlarged. Extensive mitral annular calcification. Mild aortic valvular calcification. Severe LAD coronary atherosclerosis. No pericardial effusion. Moderate to severe atherosclerosis involving  the thoracic and upper abdominal aorta without evidence of aneurysm or dissection.  Lymphatic: No pathologic mediastinal, hilar or axillary lymphadenopathy.  Other findings: None.  Visualized lower neck: Thyroid gland normal in appearance. No supraclavicular lymphadenopathy.  Visualized upper abdomen: No new finding since the CT abdomen and pelvis 02/13/2015.  Musculoskeletal: No evidence of osseous metastatic disease. Degenerative disc disease and spondylosis throughout the thoracic spine.  IMPRESSION: 1. No evidence of a primary malignancy or metastatic disease in the thorax. Specifically, no lung mass and no thyroid mass. 2. COPD/emphysema. Mild atelectasis in the lower lobes. Mucous plugging involving segmental right lower lobe bronchi. Small bilateral pleural effusions. No acute cardiopulmonary disease otherwise. 3. Moderate cardiomegaly. Extensive mitral annular calcification. Mild aortic valvular calcification. Severe LAD coronary atherosclerosis.   Electronically Signed   By: Evangeline Dakin M.D.   On: 02/19/2015 10:24    Scheduled Meds: . aspirin  325 mg Oral Daily  . cycloSPORINE  1 drop Both Eyes BID  . docusate sodium  200 mg Oral QHS  . enoxaparin (LOVENOX) injection  1 mg/kg Subcutaneous Q24H  . feeding supplement (ENSURE ENLIVE)  237 mL Oral BID BM  . montelukast  10 mg Oral QHS  . polyethylene glycol  17 g Oral Daily  . potassium chloride  40 mEq Oral Daily  . saccharomyces boulardii  250 mg Oral BID   Continuous Infusions: . sodium chloride 75 mL/hr at 02/17/15 1856    Principal Problem:   Colonic mass Active Problems:   Hypertension   Osteopenia   Leukocytosis   Anemia of chronic disease   CVA (cerebral infarction)   Stroke with cerebral ischemia   Malignancy    Time spent: 20 min Verneita Griffes, MD [facility] ([facility]) [phone]

## 2015-02-22 ENCOUNTER — Inpatient Hospital Stay (HOSPITAL_COMMUNITY)
Admission: AD | Admit: 2015-02-22 | Discharge: 2015-03-02 | DRG: 057 | Disposition: A | Discharge status: Skilled Nursing Facility | Payer: Medicare Other | Source: Intra-hospital | Attending: Physical Medicine & Rehabilitation | Admitting: Physical Medicine & Rehabilitation

## 2015-02-22 ENCOUNTER — Inpatient Hospital Stay (HOSPITAL_COMMUNITY): Payer: Medicare Other

## 2015-02-22 DIAGNOSIS — D72829 Elevated white blood cell count, unspecified: Secondary | ICD-10-CM | POA: Diagnosis present

## 2015-02-22 DIAGNOSIS — Z79899 Other long term (current) drug therapy: Secondary | ICD-10-CM

## 2015-02-22 DIAGNOSIS — D638 Anemia in other chronic diseases classified elsewhere: Secondary | ICD-10-CM | POA: Diagnosis present

## 2015-02-22 DIAGNOSIS — M549 Dorsalgia, unspecified: Secondary | ICD-10-CM | POA: Diagnosis present

## 2015-02-22 DIAGNOSIS — Z7982 Long term (current) use of aspirin: Secondary | ICD-10-CM

## 2015-02-22 DIAGNOSIS — D630 Anemia in neoplastic disease: Secondary | ICD-10-CM

## 2015-02-22 DIAGNOSIS — Z7983 Long term (current) use of bisphosphonates: Secondary | ICD-10-CM

## 2015-02-22 DIAGNOSIS — R0602 Shortness of breath: Secondary | ICD-10-CM

## 2015-02-22 DIAGNOSIS — C189 Malignant neoplasm of colon, unspecified: Secondary | ICD-10-CM | POA: Diagnosis present

## 2015-02-22 DIAGNOSIS — J449 Chronic obstructive pulmonary disease, unspecified: Secondary | ICD-10-CM | POA: Diagnosis present

## 2015-02-22 DIAGNOSIS — I69393 Ataxia following cerebral infarction: Principal | ICD-10-CM

## 2015-02-22 DIAGNOSIS — E46 Unspecified protein-calorie malnutrition: Secondary | ICD-10-CM

## 2015-02-22 DIAGNOSIS — E119 Type 2 diabetes mellitus without complications: Secondary | ICD-10-CM | POA: Diagnosis present

## 2015-02-22 DIAGNOSIS — R531 Weakness: Secondary | ICD-10-CM | POA: Diagnosis present

## 2015-02-22 DIAGNOSIS — R1310 Dysphagia, unspecified: Secondary | ICD-10-CM | POA: Diagnosis present

## 2015-02-22 DIAGNOSIS — K5730 Diverticulosis of large intestine without perforation or abscess without bleeding: Secondary | ICD-10-CM | POA: Diagnosis present

## 2015-02-22 DIAGNOSIS — M4800 Spinal stenosis, site unspecified: Secondary | ICD-10-CM | POA: Diagnosis present

## 2015-02-22 DIAGNOSIS — F0390 Unspecified dementia without behavioral disturbance: Secondary | ICD-10-CM | POA: Diagnosis present

## 2015-02-22 DIAGNOSIS — Z9109 Other allergy status, other than to drugs and biological substances: Secondary | ICD-10-CM | POA: Diagnosis not present

## 2015-02-22 DIAGNOSIS — I63449 Cerebral infarction due to embolism of unspecified cerebellar artery: Secondary | ICD-10-CM | POA: Diagnosis not present

## 2015-02-22 DIAGNOSIS — I10 Essential (primary) hypertension: Secondary | ICD-10-CM | POA: Diagnosis present

## 2015-02-22 DIAGNOSIS — H532 Diplopia: Secondary | ICD-10-CM | POA: Diagnosis present

## 2015-02-22 DIAGNOSIS — R52 Pain, unspecified: Secondary | ICD-10-CM

## 2015-02-22 DIAGNOSIS — H409 Unspecified glaucoma: Secondary | ICD-10-CM | POA: Diagnosis present

## 2015-02-22 DIAGNOSIS — G8929 Other chronic pain: Secondary | ICD-10-CM | POA: Diagnosis present

## 2015-02-22 DIAGNOSIS — I69320 Aphasia following cerebral infarction: Secondary | ICD-10-CM

## 2015-02-22 DIAGNOSIS — H3530 Unspecified macular degeneration: Secondary | ICD-10-CM | POA: Diagnosis present

## 2015-02-22 DIAGNOSIS — D509 Iron deficiency anemia, unspecified: Secondary | ICD-10-CM | POA: Diagnosis present

## 2015-02-22 DIAGNOSIS — R270 Ataxia, unspecified: Secondary | ICD-10-CM | POA: Diagnosis not present

## 2015-02-22 DIAGNOSIS — D473 Essential (hemorrhagic) thrombocythemia: Secondary | ICD-10-CM | POA: Diagnosis present

## 2015-02-22 LAB — COMPREHENSIVE METABOLIC PANEL
ALBUMIN: 2.5 g/dL — AB (ref 3.5–5.0)
ALT: 33 U/L (ref 14–54)
AST: 70 U/L — AB (ref 15–41)
Alkaline Phosphatase: 77 U/L (ref 38–126)
Anion gap: 9 (ref 5–15)
BUN: 7 mg/dL (ref 6–20)
CHLORIDE: 101 mmol/L (ref 101–111)
CO2: 24 mmol/L (ref 22–32)
CREATININE: 0.68 mg/dL (ref 0.44–1.00)
Calcium: 9 mg/dL (ref 8.9–10.3)
GFR calc Af Amer: >60 mL/min (ref >60)
GFR calc non Af Amer: >60 mL/min (ref >60)
Glucose, Bld: 92 mg/dL (ref 65–99)
POTASSIUM: 4.5 mmol/L (ref 3.5–5.1)
SODIUM: 134 mmol/L — AB (ref 135–145)
Total Bilirubin: 0.3 mg/dL (ref 0.3–1.2)
Total Protein: 5.9 g/dL — ABNORMAL LOW (ref 6.5–8.1)

## 2015-02-22 LAB — CBC
HEMATOCRIT: 31.6 % — AB (ref 36.0–46.0)
Hemoglobin: 10.5 g/dL — ABNORMAL LOW (ref 12.0–15.0)
MCH: 32.4 pg (ref 26.0–34.0)
MCHC: 33.2 g/dL (ref 30.0–36.0)
MCV: 97.5 fL (ref 78.0–100.0)
Platelets: 506 10*3/uL — ABNORMAL HIGH (ref 150–400)
RBC: 3.24 MIL/uL — ABNORMAL LOW (ref 3.87–5.11)
RDW: 13.1 % (ref 11.5–15.5)
WBC: 14.6 10*3/uL — ABNORMAL HIGH (ref 4.0–10.5)

## 2015-02-22 LAB — URINALYSIS, ROUTINE W REFLEX MICROSCOPIC
BILIRUBIN URINE: NEGATIVE
GLUCOSE, UA: NEGATIVE mg/dL
HGB URINE DIPSTICK: NEGATIVE
Ketones, ur: NEGATIVE mg/dL
Leukocytes, UA: NEGATIVE
Nitrite: NEGATIVE
PH: 7 (ref 5.0–8.0)
Protein, ur: NEGATIVE mg/dL
SPECIFIC GRAVITY, URINE: 1.018 (ref 1.005–1.030)
UROBILINOGEN UA: 0.2 mg/dL (ref 0.0–1.0)

## 2015-02-22 MED ORDER — ONDANSETRON HCL 4 MG/2ML IJ SOLN: 4.0000 mg | Freq: Four times a day (QID) | INTRAMUSCULAR | Status: DC | PRN

## 2015-02-22 MED ORDER — FLEET ENEMA 7-19 GM/118ML RE ENEM: 1.0000 | ENEMA | Freq: Once | RECTAL | Status: DC | PRN

## 2015-02-22 MED ORDER — MONTELUKAST SODIUM 10 MG PO TABS
10.0000 mg | ORAL_TABLET | Freq: Every day | ORAL | Status: DC
  Administered 2015-02-22 – 2015-03-01 (×8): 10 mg via ORAL
  Filled 2015-02-22 (×9): qty 1

## 2015-02-22 MED ORDER — ENOXAPARIN SODIUM 150 MG/ML ~~LOC~~ SOLN: 1.0000 mg/kg | SUBCUTANEOUS | Status: DC

## 2015-02-22 MED ORDER — ACETAMINOPHEN 325 MG PO TABS: 650.0000 mg | ORAL_TABLET | Freq: Four times a day (QID) | ORAL | Status: DC | PRN

## 2015-02-22 MED ORDER — POLYVINYL ALCOHOL 1.4 % OP SOLN
1.0000 [drp] | OPHTHALMIC | Status: DC | PRN
  Administered 2015-02-22 – 2015-02-27 (×5): 1 [drp] via OPHTHALMIC
  Filled 2015-02-22: qty 15

## 2015-02-22 MED ORDER — POLYETHYLENE GLYCOL 3350 17 G PO PACK: 17.0000 g | PACK | Freq: Two times a day (BID) | ORAL | Status: DC

## 2015-02-22 MED ORDER — ENSURE ENLIVE PO LIQD
237.0000 mL | Freq: Two times a day (BID) | ORAL | Status: DC
  Administered 2015-02-22 – 2015-03-02 (×11): 237 mL via ORAL

## 2015-02-22 MED ORDER — ENSURE ENLIVE PO LIQD: 237.0000 mL | Freq: Two times a day (BID) | ORAL | Status: AC

## 2015-02-22 MED ORDER — ENOXAPARIN SODIUM 60 MG/0.6ML ~~LOC~~ SOLN
1.0000 mg/kg | SUBCUTANEOUS | Status: DC
  Administered 2015-02-22 – 2015-02-23 (×2): 45 mg via SUBCUTANEOUS
  Filled 2015-02-22 (×2): qty 0.6

## 2015-02-22 MED ORDER — POLYETHYLENE GLYCOL 3350 17 G PO PACK
17.0000 g | PACK | Freq: Every day | ORAL | Status: DC
  Administered 2015-02-23 – 2015-03-02 (×8): 17 g via ORAL
  Filled 2015-02-22 (×8): qty 1

## 2015-02-22 MED ORDER — DIPHENHYDRAMINE HCL 12.5 MG/5ML PO ELIX: 12.5000 mg | ORAL_SOLUTION | Freq: Three times a day (TID) | ORAL | Status: AC | PRN

## 2015-02-22 MED ORDER — ONDANSETRON HCL 4 MG PO TABS: 4.0000 mg | ORAL_TABLET | Freq: Four times a day (QID) | ORAL | Status: DC | PRN

## 2015-02-22 MED ORDER — DOCUSATE SODIUM 100 MG PO CAPS
200.0000 mg | ORAL_CAPSULE | Freq: Every day | ORAL | Status: DC
  Administered 2015-02-22 – 2015-03-01 (×8): 200 mg via ORAL
  Filled 2015-02-22 (×8): qty 2

## 2015-02-22 MED ORDER — CYCLOSPORINE 0.05 % OP EMUL
1.0000 [drp] | Freq: Two times a day (BID) | OPHTHALMIC | Status: DC
  Administered 2015-02-23 – 2015-03-02 (×15): 1 [drp] via OPHTHALMIC
  Filled 2015-02-22 (×20): qty 1

## 2015-02-22 MED ORDER — POLYVINYL ALCOHOL 1.4 % OP SOLN: 1.0000 [drp] | OPHTHALMIC | Status: AC | PRN

## 2015-02-22 MED ORDER — ASPIRIN 325 MG PO TABS: 325.0000 mg | ORAL_TABLET | Freq: Every day | ORAL | Status: DC

## 2015-02-22 MED ORDER — GUAIFENESIN-DM 100-10 MG/5ML PO SYRP: 5.0000 mL | ORAL_SOLUTION | Freq: Four times a day (QID) | ORAL | Status: DC | PRN

## 2015-02-22 MED ORDER — ALUM & MAG HYDROXIDE-SIMETH 200-200-20 MG/5ML PO SUSP: 30.0000 mL | ORAL | Status: DC | PRN

## 2015-02-22 MED ORDER — POLYETHYLENE GLYCOL 3350 17 G PO PACK
17.0000 g | PACK | Freq: Every day | ORAL | Status: DC | PRN
  Administered 2015-02-26: 17 g via ORAL

## 2015-02-22 MED ORDER — SACCHAROMYCES BOULARDII 250 MG PO CAPS
250.0000 mg | ORAL_CAPSULE | Freq: Two times a day (BID) | ORAL | Status: DC
  Administered 2015-02-22 – 2015-03-02 (×16): 250 mg via ORAL
  Filled 2015-02-22 (×16): qty 1

## 2015-02-22 MED ORDER — DIPHENHYDRAMINE HCL 12.5 MG/5ML PO ELIX
12.5000 mg | ORAL_SOLUTION | Freq: Three times a day (TID) | ORAL | Status: DC | PRN
Start: 2015-02-22 — End: 2015-03-02

## 2015-02-22 MED ORDER — ACETAMINOPHEN 325 MG PO TABS
650.0000 mg | ORAL_TABLET | Freq: Four times a day (QID) | ORAL | Status: DC | PRN
  Administered 2015-02-22 – 2015-02-26 (×6): 650 mg via ORAL
  Filled 2015-02-22 (×6): qty 2

## 2015-02-22 MED ORDER — ASPIRIN EC 325 MG PO TBEC
325.0000 mg | DELAYED_RELEASE_TABLET | Freq: Every day | ORAL | Status: DC
  Administered 2015-02-23 – 2015-03-02 (×8): 325 mg via ORAL
  Filled 2015-02-22 (×8): qty 1

## 2015-02-22 NOTE — Clinical Social Work Note (Signed)
Clinical Social Worker notified that insurance has approved CIR/IP REHAB. CIR Coordinator planning to admit patient today. CSW has contacted [facility] with updates.   Clinical Social Worker will sign off for now as social work intervention is no longer needed. Please consult [place] again if new need arises.  Glendon Axe, MSW, LCSWA [phone] 02/22/2015 11:38 AM

## 2015-02-22 NOTE — H&P (Signed)
Physical Medicine and Rehabilitation Admission H&P    Chief Complaint  Patient presents with  . Left sided weakness, speech difficulties, visual deficits.    HPI: Katherine Cooke is a 81 y.o. female with history of HTN, glaucoma, macular degeneration, BCC, abdominal mass X years who was admitted on 02/13/15 with progressive abdominal pain X 1 week and constipation. She was found to have extensive left colonic diverticulosis with complex mas 7.6 X 7.6 x 6.3 cm , 1.4 cm liver lesion and small pelvic and retroperitoneal nodes. She was started on clear liquids/antibiotics due to leucocytosis and concerns of diverticulitis and sigmoidoscopy with biopsy positive for poorly differentiated adenocarcinoma which was likely arising from outside the colon. Pathology with possibility of lung or thyroid primary. She has been evaluated by CCS and Dr. Earlie Server for input. Patient to be presented at GI conference for input and to continue on liquid diet as patient not fully obstructed. On 09/13, patient was noted to have confusion with difficulty talking. MRI brain done revealing multifocal areas of acute infarct affecting cerebellum, left paramedian pons, left thalamus, left posterior parietal and occipital regions question embolic from posterior circulation.Neurology consulted and recommended full work up with CTA brain/neck done revealing acute/subacute infarcts left thalamus and right cerebellum and moderate stenosis at origins of B-VA worse on right. 2D echo with EF 50-55% with moderate mitral calcification and grade 1 diastolic dysfunction.   Therapeutic dose of Lovenox added due to hypercoagulable state and neurology recommends holding off on urgent surgical procedure for 2 weeks and elective procedure for 6 months. Patient placed on ASA 481 mg for embolic stroke likely due to hypercoagulopathy from advanced malignancy and to avoid hypotension. She continues on clear liquids and surgery has signed off. CT  chest positive for COPD, negative for malignancy and incidental note of moderate CM with severe LAD atherosclerosis. Dr. Marin Olp consulted for input and questioned ovarian primary and recommends surgery for resection of tumor to avoid complete obstruction. Patient is not a candidate for Chemo or XRT.   Continues to have leucocytosis with question subclinical aspiration but afebrile and being monitored.  CIR recommended for follow up therapy. Patient with resultant impaired balance, visual deficits, decreased activity tolerance, expressive aphasia requiring min to moderate assistance to respond to open ended questions. CIR recommended for follow up therapy. s.    Review of Systems  HENT: Negative for hearing loss.   Eyes: Positive for blurred vision (Has very limited vision in right eye. ).  Respiratory: Positive for shortness of breath (Last evening and this am). Negative for cough.   Cardiovascular: Negative for chest pain and leg swelling.  Gastrointestinal: Positive for heartburn, nausea and abdominal pain. Negative for vomiting.  Genitourinary: Negative for dysuria and urgency.       Occasional difficulty voiding.   Musculoskeletal: Positive for myalgias and back pain (chronic back pain radiating to right buttock and RLE---affected activity tolerance).  Skin: Negative for itching and rash.  Neurological: Positive for speech change. Negative for dizziness, tingling and headaches.  Psychiatric/Behavioral: Negative for depression. The patient is not nervous/anxious.      Past Medical History  Diagnosis Date  . Hypertension   . Spinal stenosis   . Basal cell carcinoma of nose 2005  . Squamous cell carcinoma of left hip 2005  . Colon cancer dx'd 02/16/15  . Diabetes mellitus without complication   . Glaucoma   . Macular degeneration     Past Surgical History  Procedure Laterality Date  .  Breast biopsy  1950's ; 1952 ; 1954    x 3 - Benign  . Tonsillectomy    . Flexible sigmoidoscopy  N/A 02/15/2015    Procedure: FLEXIBLE SIGMOIDOSCOPY;  Surgeon: Ronald Lobo, MD;  Location: [facility];  Service: Endoscopy;  Laterality: N/A;    Family History  Problem Relation Age of Onset  . Breast cancer Maternal Aunt   . Ovarian cancer Sister   . Heart disease Father   . Ulcers Father   . COPD Mother     Social History:  Widowed. Lives in independent living at [place]. Has assistance with home management and goes to the dinning hall for most meals. She reports that she has never smoked. She has never used smokeless tobacco. She reports that she drinks alcohol daily. She reports that she does not use illicit drugs.    Allergies  Allergen Reactions  . Lyrica [Pregabalin] Shortness Of Breath    Medications Prior to Admission  Medication Sig Dispense Refill  . ALPRAZolam (XANAX) 0.25 MG tablet Take 0.25 mg by mouth daily as needed for anxiety or sleep.     . Ascorbic Acid (VITAMIN C PO) Take 1 tablet by mouth daily.     Marland Kitchen aspirin 81 MG tablet Take 81 mg by mouth daily.    . bisacodyl (DULCOLAX) 5 MG EC tablet Take 5 mg by mouth daily as needed for mild constipation or moderate constipation.    . Ca Carbonate-Mag Hydroxide (ROLAIDS PO) Take 2 tablets by mouth 2 (two) times daily as needed (indigestion).    . cholecalciferol (VITAMIN D) 1000 UNITS tablet Take 1,000 Units by mouth daily.    . cycloSPORINE (RESTASIS) 0.05 % ophthalmic emulsion Place 1 drop into both eyes 2 (two) times daily.    . diclofenac sodium (VOLTAREN) 1 % GEL Apply 4 g topically daily as needed (pain).     . ibandronate (BONIVA) 150 MG tablet Take 150 mg by mouth every 30 (thirty) days. Take in the morning with a full glass of water, on an empty stomach, and do not take anything else by mouth or lie down for the next 30 min.    Marland Kitchen losartan-hydrochlorothiazide (HYZAAR) 100-12.5 MG per tablet Take 1 tablet by mouth daily.     Marland Kitchen MAGNESIUM PO Take 1 tablet by mouth daily.     . montelukast (SINGULAIR) 10 MG  tablet Take 10 mg by mouth at bedtime.  3  . Multiple Vitamins-Minerals (EYE VITAMINS PO) Take by mouth.    . Omega-3 Fatty Acids (FISH OIL PO) Take 2 capsules by mouth 2 (two) times daily.     . polyethylene glycol (MIRALAX / GLYCOLAX) packet Take 17 g by mouth daily as needed for mild constipation or moderate constipation.    . triazolam (HALCION) 0.25 MG tablet Take 0.25 mg by mouth at bedtime as needed for sleep.       Home: Home Living Family/patient expects to be discharged to:: Private residence Living Arrangements: Alone Available Help at Discharge: Family Type of Home: Independent living facility Home Access: Level entry [place]: One level Bathroom Shower/Tub: Multimedia programmer: Handicapped height [place]: Grab bars - tub/shower, Grab bars - toilet Additional Comments: Pt lives in [place] at [place].  Lives With: Alone   Functional History: Prior Function Level of Independence: Independent Comments: Pt living on her own, ambulating to/from dining hall, driving.  Functional Status:  Mobility: Bed Mobility Overal bed mobility: Needs Assistance Bed Mobility: Supine to Sit Supine to  sit: Min assist General bed mobility comments: assist up from flat bed and no rails Transfers Overall transfer level: Needs assistance Equipment used: Rolling walker (2 wheeled) Transfers: Sit to/from Stand Sit to Stand: Min assist Stand pivot transfers: Min assist Squat pivot transfers: Min assist, Mod assist General transfer comment: assist for anterior weight shift and safety with hand placement Ambulation/Gait Ambulation/Gait assistance: Min assist Ambulation Distance (Feet): 150 Feet Assistive device: Rolling walker (2 wheeled) Gait Pattern/deviations: Step-through pattern, Decreased stride length, Decreased step length - right, Decreased dorsiflexion - left, Trunk flexed General Gait Details: cues for posture,visual scanning and left heel strike; also cues for  safety with turns and obstacle negotiation; continues to close right eye to improve clarity (family reports previous difficulty due to macular degeneration) Gait velocity: slow and deliberate    ADL: ADL Overall ADL's : Needs assistance/impaired Eating/Feeding: Set up, Sitting Eating/Feeding Details (indicate cue type and reason): pt on liquid diet, poor appetite Grooming: Wash/dry hands, Wash/dry face, Sitting, Set up Upper Body Bathing: Minimal assitance, Sitting Lower Body Bathing: Minimal assistance, Sit to/from stand Upper Body Dressing : Minimal assistance, Sitting Lower Body Dressing: Minimal assistance, Sit to/from stand Lower Body Dressing Details (indicate cue type and reason): able to don and doff socks Toilet Transfer: Minimal assistance, Stand-pivot Toileting- Clothing Manipulation and Hygiene: Minimal assistance, Sitting/lateral lean, Sit to/from stand, Moderate assistance  Cognition: Cognition Overall Cognitive Status: Within Functional Limits for tasks assessed Orientation Level: Oriented to person, Oriented to place, Disoriented to time, Disoriented to situation Attention: Sustained Sustained Attention: Appears intact Awareness: Appears intact Cognition Arousal/Alertness: Awake/alert Behavior During Therapy: WFL for tasks assessed/performed Overall Cognitive Status: Within Functional Limits for tasks assessed Area of Impairment: Orientation, Attention, Following commands, Awareness, Safety/judgement Orientation Level: Disoriented to, Time, Situation Current Attention Level: Selective Memory: Decreased recall of precautions Following Commands: Follows multi-step commands inconsistently Awareness: Intellectual General Comments: Pt able to follow commands and answer most questions clearly. Cognition difficult to assess due to expressive aphasia. Difficult to assess due to: Impaired communication   Blood pressure 131/58, pulse 111, temperature 98.8 F (37.1 C),  temperature source Oral, resp. rate 18, height _0  (1.499 m), weight 46.72 kg (103 lb), SpO2 94 %. Physical Exam  Constitutional: She appears well-developed and well-nourished.  HENT:  Head: Normocephalic and atraumatic.  Mouth/Throat: Oropharynx is clear and moist.  Eyes: Conjunctivae are normal. Pupils are equal, round, and reactive to light.  Patch on right eye to help with visual deficits.   Neck: Neck supple.  Cardiovascular: Normal rate and regular rhythm.   Respiratory: Effort normal and breath sounds normal. No respiratory distress. She has no wheezes.  GI: She exhibits distension. Bowel sounds are decreased. There is tenderness in the right lower quadrant, periumbilical area and left lower quadrant. There is no rigidity.  Musculoskeletal: She exhibits no edema or tenderness.  Neurological: She is alert.  Oriented to self. Needed cues for place--"facility in [place]"  and situation. Told me month/year.  Expressive deficits and had difficulty answering basic Y/N questions.  Able to follow simple one and two step motor commands. Mild right sided weakness with dysmetria and mild ataxia noted with FTN. Denies differences in LT and Pain sense from left to right.   Skin: Skin is warm and dry.  Psychiatric:  Generally pleasant and cooperative. Mildly confused    Results for orders placed or performed during the hospital encounter of 02/13/15 (from the past 48 hour(s))  CBC     Status: Abnormal  Collection Time: 02/22/15  5:34 AM  Result Value Ref Range   WBC 14.6 (H) 4.0 - 10.5 K/uL   RBC 3.24 (L) 3.87 - 5.11 MIL/uL   Hemoglobin 10.5 (L) 12.0 - 15.0 g/dL   HCT 31.6 (L) 36.0 - 46.0 %   MCV 97.5 78.0 - 100.0 fL   MCH 32.4 26.0 - 34.0 pg   MCHC 33.2 30.0 - 36.0 g/dL   RDW 13.1 11.5 - 15.5 %   Platelets 506 (H) 150 - 400 K/uL  Comprehensive metabolic panel     Status: Abnormal   Collection Time: 02/22/15  5:34 AM  Result Value Ref Range   Sodium 134 (L) 135 - 145 mmol/L   Potassium  4.5 3.5 - 5.1 mmol/L   Chloride 101 101 - 111 mmol/L   CO2 24 22 - 32 mmol/L   Glucose, Bld 92 65 - 99 mg/dL   BUN 7 6 - 20 mg/dL   Creatinine, Ser 0.68 0.44 - 1.00 mg/dL   Calcium 9.0 8.9 - 10.3 mg/dL   Total Protein 5.9 (L) 6.5 - 8.1 g/dL   Albumin 2.5 (L) 3.5 - 5.0 g/dL   AST 70 (H) 15 - 41 U/L   ALT 33 14 - 54 U/L   Alkaline Phosphatase 77 38 - 126 U/L   Total Bilirubin 0.3 0.3 - 1.2 mg/dL   GFR calc non Af Amer >60 >60 mL/min   GFR calc Af Amer >60 >60 mL/min    Comment: (NOTE) The eGFR has been calculated using the CKD EPI equation. This calculation has not been validated in all clinical situations. eGFR's persistently <60 mL/min signify possible Chronic Kidney Disease.    Anion gap 9 5 - 15   No results found.     Medical Problem List and Plan: 1. 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  DVT Prophylaxis/Anticoagulation: Pharmaceutical: Lovenox 3. Spinal stenosis/Pain Management: Hydrocodone constipating --continue Tylenol prn for pain. Avoid narcotics. Will add K pad.  4. Mood: LCSW to follow for evaluation and support.  5. Neuropsych: This patient is not capable of making decisions on her own behalf. 6. Skin/Wound Care: Routine pressure relief measures. Offer supplements between meals.  7. Fluids/Electrolytes/Nutrition: Monitor I/O.  Continue supplements between meals.  8. HTN: Monitor BID. Avoid hypotension. Will d/c IVF and encourage po intake.  9. Colonic lesion with near obstruction: Continue daily Miralax with second dose prn if no BM that day to prevent obstruction. Diet has been advanced to soft but reported to have difficulty swallowing this am--will downgrade to dysphagia 2 and monitor. May need to be adjust further if she has difficulty with tolerance or recurrent abdominal pain. Will check KUB.  10. Hypokalemia: Has resolved with supplement. Will d/c Kdur and monitor. 11.   Leucocytosis: Has had increase in WBC to 14.6. Monitor of signs of infection. Will check CXR with reports of SOB in the past 24 hours.  12. Thrombocytosis: Recheck in am.  13. Iron deficiency anemia: Avoiding po iron for now due to GI symptoms.  14. Code status: Discussed--Patient and family desire Full Code.    Post Admission Physician Evaluation: 1. Functional deficits secondary  to bilateral infarcts affecting left thalamus left paramedian pons and right cerebellum 2. Patient is admitted to receive collaborative, interdisciplinary care between the physiatrist, rehab nursing staff, and therapy team. 3. Patient's level of medical complexity and substantial therapy needs in context of that medical necessity cannot be  provided at a lesser intensity of care such as a SNF. 4. Patient has experienced substantial functional loss from his/her baseline which was documented above under the "Functional History" and "Functional Status" headings.  Judging by the patient's diagnosis, physical exam, and functional history, the patient has potential for functional progress which will result in measurable gains while on inpatient rehab.  These gains will be of substantial and practical use upon discharge  in facilitating mobility and self-care at the household level. 5. Physiatrist will provide 24 hour management of medical needs as well as oversight of the therapy plan/treatment and provide guidance as appropriate regarding the interaction of the two. 6. 24 hour rehab nursing will assist with bladder management, bowel management, safety, skin/wound care, disease management, medication administration, pain management and patient education  and help integrate therapy concepts, techniques,education, etc. 7. PT will assess and treat for/with: Lower extremity strength, range of motion, stamina, balance, functional mobility, safety, adaptive techniques and equipment, visual-spatial awareness, cognitive perceptual  awareness, family ed.   Goals are: supervision. 8. OT will assess and treat for/with: ADL's, functional mobility, safety, upper extremity strength, adaptive techniques and equipment, NMR, cognitive perceptual awareness, community reintegration, caregiver ed, stroke ed.   Goals are: supervision. Therapy may proceed with showering this patient. 9. SLP will assess and treat for/with: language, cognition, communication, swallowing.  Goals are: supervision to min assist. 10. Case Management and Social Worker will assess and treat for psychological issues and discharge planning. 11. Team conference will be held weekly to assess progress toward goals and to determine barriers to discharge. 12. Patient will receive at least 3 hours of therapy per day at least 5 days per week. 13. ELOS: 7-11 days       14. Prognosis:  excellent     Meredith Staggers, MD, [facility] 02/22/2015   02/22/2015

## 2015-02-22 NOTE — PMR Pre-admission (Signed)
PMR Admission Coordinator Pre-Admission Assessment  Patient: Katherine Cooke is an 81 y.o., female MRN: [id number] DOB: 10-Aug-1925 Height: '4\' 11"'$  (149.9 cm) Weight: 46.72 kg (103 lb)              Insurance Information HMO: yes    PPO:      PCP:      IPA:      80/20:      OTHER: medicare replacement policy PRIMARY: A[employer] Medicare       Policy#: [id number]      Subscriber: pt CM Name: Jenny Reichmann      Phone#: [phone]     Fax#: [facility] online Pre-Cert#: [id number]      Employer: retired approved for 7 days f/u with Eulah Citizen phone [phone] fax EMR access Benefits:  Phone #: [phone]     Name: 02/22/15 Eff. Date: 11/04/14     Deduct: none      Out of Pocket Max: $4500      Life Max: none CIR: $345 per day days 1-5 then covers 100%      SNF: no copay days 1-10; $160 copay per day days 21-49; no copay days 50-100 Outpatient: $40 copay per visit     Co-Pay: no visit limit Home Health: 100%      Co-Pay: no visit limit DME: 80%     Co-Pay: 20% Providers: in network  SECONDARY: none       Medicaid Application Date:       Case Manager:  Disability Application Date:       Case Worker:   Emergency Facilities manager Information    Name Relation Home Work [place] Son [phone]  [place]  [phone]       Current Medical History  Patient Admitting Diagnosis: bilateral infarcts  History of Present Illness: Katherine Cooke is a 81 y.o. female with history of HTN, glaucoma, macular degeneration, BCC, abdominal mass X years who was admitted on 02/13/15 with progressive abdominal pain X 1 week and constipation. She was found to have extensive left colonic diverticulosis with complex mas 7.6 X 7.6 x 6.3 cm , 1.4 cm liver lesion and small pelvic and retroperitoneal nodes. She was started on clear liquids/antibiotics due to leucocytosis and concerns of diverticulitis and sigmoidoscopy with biopsy positive for poorly differentiated adenocarcinoma which was likely  arising from outside the colon. Pathology with possibility of lung or thyroid primary. She has been evaluated by [facility] and Dr. Earlie Server for input. Patient to be presented at GI conference for input and to continue on liquid diet as patient not fully obstructed. On 09/13, patient was noted to have confusion with difficulty talking. MRI brain done revealing multifocal areas of acute infarct affecting cerebellum, left paramedian pons, left thalamus, left posterior parietal and occipital regions question embolic from posterior circulation.Neurology consulted and recommended full work up with CTA brain/neck done revealing acute/subacute infarcts left thalamus and right cerebellum and moderate stenosis at origins of B-VA worse on right. 2D echo with EF 50-55% with moderate mitral calcification and grade 1 diastolic dysfunction.   Therapeutic dose of Lovenox added due to hypercoagulable state and neurology recommends holding off on urgent surgical procedure for 2 weeks and elective procedure for 6 months. Patient placed on ASA 220 mg for embolic stroke likely due to hypercoagulopathy from advanced malignancy and to avoid hypotension. She continues on clear liquids and surgery has signed off. CT chest positive for COPD, negative for malignancy and incidental note of  moderate CM with severe LAD atherosclerosis. Dr. Marin Olp consulted for input and questioned ovarian primary and recommends surgery for resection of tumor to avoid complete obstruction. Patient is not a candidate for Chemo or XRT. Continues to have leucocytosis with question subclinical aspiration but afebrile and being monitored. CIR recommended for follow up therapy. Patient with resultant impaired balance, visual deficits, decreased activity tolerance, expressive aphasia requiring min to moderate assistance to respond to open ended questions.   Total: 5 NIH    Past Medical History  Past Medical History  Diagnosis Date  . Hypertension   .  Spinal stenosis   . Basal cell carcinoma of nose 2005  . Squamous cell carcinoma of left hip 2005  . Colon cancer dx'd 02/16/15  . Diabetes mellitus without complication   . Glaucoma   . Macular degeneration    Family History  family history includes Breast cancer in her maternal aunt; COPD in her mother; Heart disease in her father; Ovarian cancer in her sister; Ulcers in her father.  Prior Rehab/Hospitalizations:  Has the patient had major surgery during 100 days prior to admission? No  Current Medications   Current facility-administered medications:  .  0.9 %  sodium chloride infusion, , Intravenous, Continuous, Debbe Odea, MD, Last Rate: 75 mL/hr at 02/17/15 1856 .  acetaminophen (TYLENOL) tablet 650 mg, 650 mg, Oral, Q6H PRN, Debbe Odea, MD, 650 mg at 02/20/15 1135 .  aspirin tablet 325 mg, 325 mg, Oral, Daily, Kelvin Cellar, MD, 325 mg at 02/22/15 1020 .  cycloSPORINE (RESTASIS) 0.05 % ophthalmic emulsion 1 drop, 1 drop, Both Eyes, BID, Reubin Milan, MD, 1 drop at 02/22/15 1021 .  diphenhydrAMINE (BENADRYL) 12.5 MG/5ML elixir 12.5 mg, 12.5 mg, Oral, Q8H PRN, Gardiner Barefoot, NP .  docusate sodium (COLACE) capsule 200 mg, 200 mg, Oral, QHS, Debbe Odea, MD, 200 mg at 02/21/15 2146 .  enoxaparin (LOVENOX) injection 45 mg, 1 mg/kg, Subcutaneous, Q24H, Cecilio Asper Batchelder, RPH, 45 mg at 02/21/15 1512 .  feeding supplement (ENSURE ENLIVE) (ENSURE ENLIVE) liquid 237 mL, 237 mL, Oral, BID BM, Reanne J Barbato, RD, 237 mL at 02/22/15 1020 .  montelukast (SINGULAIR) tablet 10 mg, 10 mg, Oral, QHS, Reubin Milan, MD, 10 mg at 02/21/15 2146 .  morphine 4 MG/ML injection 1 mg, 1 mg, Intravenous, Q2H PRN, Reubin Milan, MD, 1 mg at 02/22/15 0839 .  oxyCODONE (Oxy IR/ROXICODONE) immediate release tablet 5 mg, 5 mg, Oral, Q6H PRN, Debbe Odea, MD, 5 mg at 02/21/15 1814 .  polyethylene glycol (MIRALAX / GLYCOLAX) packet 17 g, 17 g, Oral, Daily, Nita Sells, MD,  17 g at 02/22/15 1021 .  polyvinyl alcohol (LIQUIFILM TEARS) 1.4 % ophthalmic solution 1 drop, 1 drop, Both Eyes, PRN, Ritta Slot, NP, 1 drop at 02/20/15 2230 .  potassium chloride SA (K-DUR,KLOR-CON) CR tablet 40 mEq, 40 mEq, Oral, Daily, Nita Sells, MD, 40 mEq at 02/22/15 1022 .  saccharomyces boulardii (FLORASTOR) capsule 250 mg, 250 mg, Oral, BID, Ronald Lobo, MD, 250 mg at 02/22/15 1020  Patients Current Diet: DIET SOFT Room service appropriate?: Yes; Fluid consistency:: Thin  Precautions / Restrictions Precautions Precautions: Fall Precaution Comments: R vision loss, maintains R eye closed Restrictions Weight Bearing Restrictions: No   Has the patient had 2 or more falls or a fall with injury in the past year?No  Prior Activity Level Community (5-7x/wk): very active and driving pta Pt very active going out daily and driving to visit children and  friends, to movies, Production designer, theatre/television/film, Social research officer, government.  Development worker, international aid / [place] Devices/Equipment: Grab bars in shower, Shower chair without back Home Equipment: Grab bars - tub/shower, Grab bars - toilet  Prior Device Use: Indicate devices/aids used by the patient prior to current illness, exacerbation or injury? None of the above  Prior Functional Level Prior Function Level of Independence: Independent Comments: Pt living on her own, ambulating to/from dining hall, driving.  Self Care: Did the patient need help bathing, dressing, using the toilet or eating?  Independent  Indoor Mobility: Did the patient need assistance with walking from room to room (with or without device)? Independent  Stairs: Did the patient need assistance with internal or external stairs (with or without device)? Independent  Functional Cognition: Did the patient need help planning regular tasks such as shopping or remembering to take medications? Independent  Current Functional Level Cognition  Overall Cognitive Status: Within  Functional Limits for tasks assessed Difficult to assess due to: Impaired communication Current Attention Level: Selective Orientation Level: Oriented to person, Oriented to place, Disoriented to time, Disoriented to situation Following Commands: Follows multi-step commands inconsistently General Comments: pt does remain slightly impulsive trying to get up without assist Attention: Sustained Sustained Attention: Appears intact Awareness: Appears intact    Extremity Assessment (includes Sensation/Coordination)  Upper Extremity Assessment: Generalized weakness (did not note particularly weakness on one side)  Lower Extremity Assessment: Defer to PT evaluation RLE Deficits / Details: R hip flex 3+/5, knee ext 4+/5, knee flex 4/5, ankle DF 1/5, PF 1/5 RLE Sensation: decreased proprioception (based on function) RLE Coordination: decreased gross motor    ADLs  Overall ADL's : Needs assistance/impaired Eating/Feeding: Set up, Sitting Eating/Feeding Details (indicate cue type and reason): pt on liquid diet, poor appetite Grooming: Wash/dry hands, Wash/dry face, Sitting, Set up Upper Body Bathing: Minimal assitance, Sitting Lower Body Bathing: Minimal assistance, Sit to/from stand Upper Body Dressing : Minimal assistance, Sitting Lower Body Dressing: Minimal assistance, Sit to/from stand Lower Body Dressing Details (indicate cue type and reason): able to don and doff socks Toilet Transfer: Minimal assistance, Stand-pivot Toileting- Clothing Manipulation and Hygiene: Minimal assistance, Sitting/lateral lean, Sit to/from stand, Moderate assistance    Mobility  Overal bed mobility: Needs Assistance Bed Mobility: Supine to Sit Supine to sit: Min assist General bed mobility comments: pt up in chair    Transfers  Overall transfer level: Needs assistance Equipment used: 1 person hand held assist Transfers: Sit to/from Stand Sit to Stand: Min assist Stand pivot transfers: Min assist Squat  pivot transfers: Min assist, Mod assist General transfer comment: max v/c's for safe hand placement, v/c's/tactile cues for anterior weight shift, pt posted legs up against chair    Ambulation / Gait / Stairs / Wheelchair Mobility  Ambulation/Gait Ambulation/Gait assistance: Museum/gallery curator (Feet): 150 Feet Assistive device: Rolling walker (2 wheeled) Gait Pattern/deviations: Step-through pattern General Gait Details: slow, guarded, shuffled, extremely narrow base of support, easily off balance however pt with vision deficit as well and maintaining R eye closed. pt with improved stabilty with RW however con't to minA for walker management when turning and around obstacles Gait velocity: slow and deliberate Gait velocity interpretation: <1.8 ft/sec, indicative of risk for recurrent falls    Posture / Balance Dynamic Sitting Balance Sitting balance - Comments: no LOB with LB dressing in sitting Balance Overall balance assessment: Needs assistance Sitting-balance support: No upper extremity supported Sitting balance-Leahy Scale: Fair Sitting balance - Comments: no LOB with LB dressing  in sitting Postural control: Right lateral lean Standing balance support: Single extremity supported Standing balance-Leahy Scale: Poor Standing balance comment: UE support needed for balance Standardized Balance Assessment Standardized Balance Assessment : Dynamic Gait Index Dynamic Gait Index Level Surface: Moderate Impairment Change in Gait Speed: Moderate Impairment Gait with Horizontal Head Turns: Moderate Impairment Gait with Vertical Head Turns: Moderate Impairment Gait and Pivot Turn: Moderate Impairment Step Over Obstacle: Moderate Impairment Step Around Obstacles: Moderate Impairment Steps: Moderate Impairment Total Score: 8    Special needs/care consideration Bowel mgmt: LBM 9/18 continetn Bladder mgmt: continent New dx colon ca 02/16/15. Surgery postponed due to  CVA. Oncology is Dr. Marin Olp Dr. Zenaida Niece is surgeon   Previous Home Environment Living Arrangements: Alone  Lives With: Alone Available Help at Discharge: Other (Comment) ([facility] for 7 yrs; can go to ALF or SNF at [facility] ) Type of Home: Independent living facility Home Layout: One level Home Access: Level entry Bathroom Shower/Tub: Multimedia programmer: Handicapped height Bathroom Accessibility: Yes How Accessible: Accessible via walker [place]: No Additional Comments: Lived [facility] for 7 yrs. 5 yrs since her spouse died  Discharge Living Setting Plans for Discharge Living Setting: Other (Comment) (Likely will need ALF at [facility] at d/c. Pt is aware and re) Type of Home at Discharge: [place] Name at Discharge: [facility] retirement facility Discharge Home Layout: One level Discharge Home Access: Level entry Discharge Bathroom Shower/Tub: Walk-in shower Discharge Bathroom Toilet: Handicapped height Discharge Bathroom Accessibility: Yes How Accessible: Accessible via walker Does the patient have any problems obtaining your medications?: No [name] spouse had admission at CIR 6 yrs ago. He is now deceased.  Social/Family/Support Systems Patient Roles: Parent (three children. Son and dtr local and one son in [place]) [place] Information: Lanice Schwab, Local son Anticipated Caregiver: likely will need ALF at d/c Anticipated Caregiver's Contact Information: see above Ability/Limitations of Caregiver: son and dtr local and very supprotive and involved daily  Caregiver Availability: Other (Comment) (24/7 supervision at ALF) Discharge Plan Discussed with Primary Caregiver: Yes Is Caregiver In Agreement with Plan?: Yes Does Caregiver/Family have Issues with Lodging/Transportation while Pt is in Rehab?: No  Goals/Additional Needs Patient/Family Goal for Rehab: supervision with PT, OT, and SLP Expected  length of stay: [facility] 12-16 days Special Service Needs: Colon surgery postponed due to CVA while preparing for colon surgery. Likely d/c home prior to surgery Pt/Family Agrees to Admission and willing to participate: Yes Program Orientation Provided & Reviewed with Pt/Caregiver Including Roles  & Responsibilities: Yes  Decrease burden of Care through IP rehab admission: n/a  Possible need for SNF placement upon discharge:pt from [facility]. She is aware that ALF there at [facility] likely necessary at d.c/  Patient Condition: This patient's medical and functional status has changed since the consult dated: 02/19/15 in which the Rehabilitation Physician determined and documented that the patient's condition is appropriate for intensive rehabilitative care in an inpatient rehabilitation facility. See "History of Present Illness" (above) for medical update. Functional changes are: overall min assist. Patient's medical and functional status update has been discussed with the Rehabilitation physician and patient remains appropriate for inpatient rehabilitation. Will admit to inpatient rehab today.  Preadmission Screen Completed By:  Cleatrice Burke, 02/22/2015 12:52 PM ______________________________________________________________________   Discussed status with Dr. Naaman Plummer on 02/22/15 at  1252 and received telephone approval for admission today.  Admission Coordinator:  Cleatrice Burke, time 0932 Date 02/22/2015

## 2015-02-22 NOTE — Progress Notes (Signed)
An inpt rehab bed is available today Pending [employer] approval which I initiated Friday. Pt and her son , [name], are aware and in agreement to admission if insurance approves. RN CM and SW are aware. I will follow up today. [phone]

## 2015-02-22 NOTE — Progress Notes (Signed)
Physical Therapy Treatment Patient Details Name: Katherine Cooke MRN: [id number] DOB: 1925/08/07 Today's Date: 02/22/2015    History of Present Illness Pt admitted to [facility] with colonic mass. Developed facial droop, vision and speech changes.  MRI revealed multifocal areas of acute infarct affecting cerebellum, L pons, L thalamus, L parietal and B occipital lobes. Transferred to [facility].    PT Comments    Pt very pleasant and motivated return to baseline. Pt con't to present with R sided weakness, decreased insight to deficits and impaired balance. Pt at increased falls risk as indicated by score of 8 on DGI. Con't to recommend CIR upon d/c to maximize functional recovery!!   Follow Up Recommendations  CIR;Supervision/Assistance - 24 hour     Equipment Recommendations  Rolling walker with 5" wheels    Recommendations for Other Services Rehab consult;OT consult;Speech consult     Precautions / Restrictions Precautions Precautions: Fall Precaution Comments: R vision loss, maintains R eye closed Restrictions Weight Bearing Restrictions: No    Mobility  Bed Mobility               General bed mobility comments: pt up in chair  Transfers Overall transfer level: Needs assistance Equipment used: 1 person hand held assist Transfers: Sit to/from Stand Sit to Stand: Min assist         General transfer comment: max v/c's for safe hand placement, v/c's/tactile cues for anterior weight shift, pt posted legs up against chair  Ambulation/Gait Ambulation/Gait assistance: Min assist Ambulation Distance (Feet): 150 Feet Assistive device: Rolling walker (2 wheeled) Gait Pattern/deviations: Step-through pattern Gait velocity: slow and deliberate Gait velocity interpretation: <1.8 ft/sec, indicative of risk for recurrent falls General Gait Details: slow, guarded, shuffled, extremely narrow base of support, easily off balance however pt with vision deficit as well and maintaining R eye  closed. pt with improved stabilty with RW however con't to minA for walker management when turning and around obstacles   Stairs            Wheelchair Mobility    Modified Rankin (Stroke Patients Only) Modified Rankin (Stroke Patients Only) Pre-Morbid Rankin Score: No symptoms Modified Rankin: Moderately severe disability     Balance           Standing balance support: Single extremity supported Standing balance-Leahy Scale: Poor                      Cognition Arousal/Alertness: Awake/alert Behavior During Therapy: WFL for tasks assessed/performed Overall Cognitive Status: Within Functional Limits for tasks assessed                 General Comments: pt does remain slightly impulsive trying to get up without assist    Exercises      General Comments General comments (skin integrity, edema, etc.): pt assisted to commode, able to complete hygiene with superv      Pertinent Vitals/Pain Pain Assessment: No/denies pain    Home Living                      Prior Function            PT Goals (current goals can now be found in the care plan section) Progress towards PT goals: Progressing toward goals    Frequency  Min 4X/week    PT Plan Current plan remains appropriate    Co-evaluation             End of Session Equipment  Utilized During Treatment: Gait belt Activity Tolerance: Patient limited by fatigue Patient left: in chair;with call bell/phone within reach;with family/visitor present     Time: 3299-2426 PT Time Calculation (min) (ACUTE ONLY): 29 min  Charges:  $Gait Training: 23-37 mins                    G Codes:      Katherine Cooke 02/22/2015, 12:19 PM   Katherine Cooke, PT, DPT Pager #: [phone] Office #: [phone]

## 2015-02-22 NOTE — Interval H&P Note (Signed)
Katherine Cooke was admitted today to Inpatient Rehabilitation with the diagnosis of embolic bilateral CVA's.  The patient's history has been reviewed, patient examined, and there is no change in status.  Patient continues to be appropriate for intensive inpatient rehabilitation.  I have reviewed the patient's chart and labs.  Questions were answered to the patient's satisfaction. The PAPE has been reviewed and assessment remains appropriate.  SWARTZ,ZACHARY T 02/22/2015, 4:54 PM

## 2015-02-22 NOTE — H&P (View-Only) (Signed)
Physical Medicine and Rehabilitation Admission H&P    Chief Complaint  Patient presents with  . Left sided weakness, speech difficulties, visual deficits.    HPI: Katherine Cooke is a 81 y.o. female with history of HTN, glaucoma, macular degeneration, BCC, abdominal mass X years who was admitted on 02/13/15 with progressive abdominal pain X 1 week and constipation. She was found to have extensive left colonic diverticulosis with complex mas 7.6 X 7.6 x 6.3 cm , 1.4 cm liver lesion and small pelvic and retroperitoneal nodes. She was started on clear liquids/antibiotics due to leucocytosis and concerns of diverticulitis and sigmoidoscopy with biopsy positive for poorly differentiated adenocarcinoma which was likely arising from outside the colon. Pathology with possibility of lung or thyroid primary. She has been evaluated by CCS and Dr. Earlie Server for input. Patient to be presented at GI conference for input and to continue on liquid diet as patient not fully obstructed. On 09/13, patient was noted to have confusion with difficulty talking. MRI brain done revealing multifocal areas of acute infarct affecting cerebellum, left paramedian pons, left thalamus, left posterior parietal and occipital regions question embolic from posterior circulation.Neurology consulted and recommended full work up with CTA brain/neck done revealing acute/subacute infarcts left thalamus and right cerebellum and moderate stenosis at origins of B-VA worse on right. 2D echo with EF 50-55% with moderate mitral calcification and grade 1 diastolic dysfunction.   Therapeutic dose of Lovenox added due to hypercoagulable state and neurology recommends holding off on urgent surgical procedure for 2 weeks and elective procedure for 6 months. Patient placed on ASA 481 mg for embolic stroke likely due to hypercoagulopathy from advanced malignancy and to avoid hypotension. She continues on clear liquids and surgery has signed off. CT  chest positive for COPD, negative for malignancy and incidental note of moderate CM with severe LAD atherosclerosis. Dr. Marin Olp consulted for input and questioned ovarian primary and recommends surgery for resection of tumor to avoid complete obstruction. Patient is not a candidate for Chemo or XRT.   Continues to have leucocytosis with question subclinical aspiration but afebrile and being monitored.  CIR recommended for follow up therapy. Patient with resultant impaired balance, visual deficits, decreased activity tolerance, expressive aphasia requiring min to moderate assistance to respond to open ended questions. CIR recommended for follow up therapy. s.    Review of Systems  HENT: Negative for hearing loss.   Eyes: Positive for blurred vision (Has very limited vision in right eye. ).  Respiratory: Positive for shortness of breath (Last evening and this am). Negative for cough.   Cardiovascular: Negative for chest pain and leg swelling.  Gastrointestinal: Positive for heartburn, nausea and abdominal pain. Negative for vomiting.  Genitourinary: Negative for dysuria and urgency.       Occasional difficulty voiding.   Musculoskeletal: Positive for myalgias and back pain (chronic back pain radiating to right buttock and RLE---affected activity tolerance).  Skin: Negative for itching and rash.  Neurological: Positive for speech change. Negative for dizziness, tingling and headaches.  Psychiatric/Behavioral: Negative for depression. The patient is not nervous/anxious.      Past Medical History  Diagnosis Date  . Hypertension   . Spinal stenosis   . Basal cell carcinoma of nose 2005  . Squamous cell carcinoma of left hip 2005  . Colon cancer dx'd 02/16/15  . Diabetes mellitus without complication   . Glaucoma   . Macular degeneration     Past Surgical History  Procedure Laterality Date  .  Breast biopsy  1950's ; 1952 ; 1954    x 3 - Benign  . Tonsillectomy    . Flexible sigmoidoscopy  N/A 02/15/2015    Procedure: FLEXIBLE SIGMOIDOSCOPY;  Surgeon: Ronald Lobo, MD;  Location: [facility];  Service: Endoscopy;  Laterality: N/A;    Family History  Problem Relation Age of Onset  . Breast cancer Maternal Aunt   . Ovarian cancer Sister   . Heart disease Father   . Ulcers Father   . COPD Mother     Social History:  Widowed. Lives in independent living at [place]. Has assistance with home management and goes to the dinning hall for most meals. She reports that she has never smoked. She has never used smokeless tobacco. She reports that she drinks alcohol daily. She reports that she does not use illicit drugs.    Allergies  Allergen Reactions  . Lyrica [Pregabalin] Shortness Of Breath    Medications Prior to Admission  Medication Sig Dispense Refill  . ALPRAZolam (XANAX) 0.25 MG tablet Take 0.25 mg by mouth daily as needed for anxiety or sleep.     . Ascorbic Acid (VITAMIN C PO) Take 1 tablet by mouth daily.     Marland Kitchen aspirin 81 MG tablet Take 81 mg by mouth daily.    . bisacodyl (DULCOLAX) 5 MG EC tablet Take 5 mg by mouth daily as needed for mild constipation or moderate constipation.    . Ca Carbonate-Mag Hydroxide (ROLAIDS PO) Take 2 tablets by mouth 2 (two) times daily as needed (indigestion).    . cholecalciferol (VITAMIN D) 1000 UNITS tablet Take 1,000 Units by mouth daily.    . cycloSPORINE (RESTASIS) 0.05 % ophthalmic emulsion Place 1 drop into both eyes 2 (two) times daily.    . diclofenac sodium (VOLTAREN) 1 % GEL Apply 4 g topically daily as needed (pain).     . ibandronate (BONIVA) 150 MG tablet Take 150 mg by mouth every 30 (thirty) days. Take in the morning with a full glass of water, on an empty stomach, and do not take anything else by mouth or lie down for the next 30 min.    Marland Kitchen losartan-hydrochlorothiazide (HYZAAR) 100-12.5 MG per tablet Take 1 tablet by mouth daily.     Marland Kitchen MAGNESIUM PO Take 1 tablet by mouth daily.     . montelukast (SINGULAIR) 10 MG  tablet Take 10 mg by mouth at bedtime.  3  . Multiple Vitamins-Minerals (EYE VITAMINS PO) Take by mouth.    . Omega-3 Fatty Acids (FISH OIL PO) Take 2 capsules by mouth 2 (two) times daily.     . polyethylene glycol (MIRALAX / GLYCOLAX) packet Take 17 g by mouth daily as needed for mild constipation or moderate constipation.    . triazolam (HALCION) 0.25 MG tablet Take 0.25 mg by mouth at bedtime as needed for sleep.       Home: Home Living Family/patient expects to be discharged to:: Private residence Living Arrangements: Alone Available Help at Discharge: Family Type of Home: Independent living facility Home Access: Level entry [place]: One level Bathroom Shower/Tub: Multimedia programmer: Handicapped height [place]: Grab bars - tub/shower, Grab bars - toilet Additional Comments: Pt lives in [place] at [place].  Lives With: Alone   Functional History: Prior Function Level of Independence: Independent Comments: Pt living on her own, ambulating to/from dining hall, driving.  Functional Status:  Mobility: Bed Mobility Overal bed mobility: Needs Assistance Bed Mobility: Supine to Sit Supine to  sit: Min assist General bed mobility comments: assist up from flat bed and no rails Transfers Overall transfer level: Needs assistance Equipment used: Rolling walker (2 wheeled) Transfers: Sit to/from Stand Sit to Stand: Min assist Stand pivot transfers: Min assist Squat pivot transfers: Min assist, Mod assist General transfer comment: assist for anterior weight shift and safety with hand placement Ambulation/Gait Ambulation/Gait assistance: Min assist Ambulation Distance (Feet): 150 Feet Assistive device: Rolling walker (2 wheeled) Gait Pattern/deviations: Step-through pattern, Decreased stride length, Decreased step length - right, Decreased dorsiflexion - left, Trunk flexed General Gait Details: cues for posture,visual scanning and left heel strike; also cues for  safety with turns and obstacle negotiation; continues to close right eye to improve clarity (family reports previous difficulty due to macular degeneration) Gait velocity: slow and deliberate    ADL: ADL Overall ADL's : Needs assistance/impaired Eating/Feeding: Set up, Sitting Eating/Feeding Details (indicate cue type and reason): pt on liquid diet, poor appetite Grooming: Wash/dry hands, Wash/dry face, Sitting, Set up Upper Body Bathing: Minimal assitance, Sitting Lower Body Bathing: Minimal assistance, Sit to/from stand Upper Body Dressing : Minimal assistance, Sitting Lower Body Dressing: Minimal assistance, Sit to/from stand Lower Body Dressing Details (indicate cue type and reason): able to don and doff socks Toilet Transfer: Minimal assistance, Stand-pivot Toileting- Clothing Manipulation and Hygiene: Minimal assistance, Sitting/lateral lean, Sit to/from stand, Moderate assistance  Cognition: Cognition Overall Cognitive Status: Within Functional Limits for tasks assessed Orientation Level: Oriented to person, Oriented to place, Disoriented to time, Disoriented to situation Attention: Sustained Sustained Attention: Appears intact Awareness: Appears intact Cognition Arousal/Alertness: Awake/alert Behavior During Therapy: WFL for tasks assessed/performed Overall Cognitive Status: Within Functional Limits for tasks assessed Area of Impairment: Orientation, Attention, Following commands, Awareness, Safety/judgement Orientation Level: Disoriented to, Time, Situation Current Attention Level: Selective Memory: Decreased recall of precautions Following Commands: Follows multi-step commands inconsistently Awareness: Intellectual General Comments: Pt able to follow commands and answer most questions clearly. Cognition difficult to assess due to expressive aphasia. Difficult to assess due to: Impaired communication   Blood pressure 131/58, pulse 111, temperature 98.8 F (37.1 C),  temperature source Oral, resp. rate 18, height _0  (1.499 m), weight 46.72 kg (103 lb), SpO2 94 %. Physical Exam  Constitutional: She appears well-developed and well-nourished.  HENT:  Head: Normocephalic and atraumatic.  Mouth/Throat: Oropharynx is clear and moist.  Eyes: Conjunctivae are normal. Pupils are equal, round, and reactive to light.  Patch on right eye to help with visual deficits.   Neck: Neck supple.  Cardiovascular: Normal rate and regular rhythm.   Respiratory: Effort normal and breath sounds normal. No respiratory distress. She has no wheezes.  GI: She exhibits distension. Bowel sounds are decreased. There is tenderness in the right lower quadrant, periumbilical area and left lower quadrant. There is no rigidity.  Musculoskeletal: She exhibits no edema or tenderness.  Neurological: She is alert.  Oriented to self. Needed cues for place--"facility in [place]"  and situation. Told me month/year.  Expressive deficits and had difficulty answering basic Y/N questions.  Able to follow simple one and two step motor commands. Mild right sided weakness with dysmetria and mild ataxia noted with FTN. Denies differences in LT and Pain sense from left to right.   Skin: Skin is warm and dry.  Psychiatric:  Generally pleasant and cooperative. Mildly confused    Results for orders placed or performed during the hospital encounter of 02/13/15 (from the past 48 hour(s))  CBC     Status: Abnormal  Collection Time: 02/22/15  5:34 AM  Result Value Ref Range   WBC 14.6 (H) 4.0 - 10.5 K/uL   RBC 3.24 (L) 3.87 - 5.11 MIL/uL   Hemoglobin 10.5 (L) 12.0 - 15.0 g/dL   HCT 31.6 (L) 36.0 - 46.0 %   MCV 97.5 78.0 - 100.0 fL   MCH 32.4 26.0 - 34.0 pg   MCHC 33.2 30.0 - 36.0 g/dL   RDW 13.1 11.5 - 15.5 %   Platelets 506 (H) 150 - 400 K/uL  Comprehensive metabolic panel     Status: Abnormal   Collection Time: 02/22/15  5:34 AM  Result Value Ref Range   Sodium 134 (L) 135 - 145 mmol/L   Potassium  4.5 3.5 - 5.1 mmol/L   Chloride 101 101 - 111 mmol/L   CO2 24 22 - 32 mmol/L   Glucose, Bld 92 65 - 99 mg/dL   BUN 7 6 - 20 mg/dL   Creatinine, Ser 0.68 0.44 - 1.00 mg/dL   Calcium 9.0 8.9 - 10.3 mg/dL   Total Protein 5.9 (L) 6.5 - 8.1 g/dL   Albumin 2.5 (L) 3.5 - 5.0 g/dL   AST 70 (H) 15 - 41 U/L   ALT 33 14 - 54 U/L   Alkaline Phosphatase 77 38 - 126 U/L   Total Bilirubin 0.3 0.3 - 1.2 mg/dL   GFR calc non Af Amer >60 >60 mL/min   GFR calc Af Amer >60 >60 mL/min    Comment: (NOTE) The eGFR has been calculated using the CKD EPI equation. This calculation has not been validated in all clinical situations. eGFR's persistently <60 mL/min signify possible Chronic Kidney Disease.    Anion gap 9 5 - 15   No results found.     Medical Problem List and Plan: 1. 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  DVT Prophylaxis/Anticoagulation: Pharmaceutical: Lovenox 3. Spinal stenosis/Pain Management: Hydrocodone constipating --continue Tylenol prn for pain. Avoid narcotics. Will add K pad.  4. Mood: LCSW to follow for evaluation and support.  5. Neuropsych: This patient is not capable of making decisions on her own behalf. 6. Skin/Wound Care: Routine pressure relief measures. Offer supplements between meals.  7. Fluids/Electrolytes/Nutrition: Monitor I/O.  Continue supplements between meals.  8. HTN: Monitor BID. Avoid hypotension. Will d/c IVF and encourage po intake.  9. Colonic lesion with near obstruction: Continue daily Miralax with second dose prn if no BM that day to prevent obstruction. Diet has been advanced to soft but reported to have difficulty swallowing this am--will downgrade to dysphagia 2 and monitor. May need to be adjust further if she has difficulty with tolerance or recurrent abdominal pain. Will check KUB.  10. Hypokalemia: Has resolved with supplement. Will d/c Kdur and monitor. 11.   Leucocytosis: Has had increase in WBC to 14.6. Monitor of signs of infection. Will check CXR with reports of SOB in the past 24 hours.  12. Thrombocytosis: Recheck in am.  13. Iron deficiency anemia: Avoiding po iron for now due to GI symptoms.  14. Code status: Discussed--Patient and family desire Full Code.    Post Admission Physician Evaluation: 1. Functional deficits secondary  to bilateral infarcts affecting left thalamus left paramedian pons and right cerebellum 2. Patient is admitted to receive collaborative, interdisciplinary care between the physiatrist, rehab nursing staff, and therapy team. 3. Patient's level of medical complexity and substantial therapy needs in context of that medical necessity cannot be  provided at a lesser intensity of care such as a SNF. 4. Patient has experienced substantial functional loss from his/her baseline which was documented above under the "Functional History" and "Functional Status" headings.  Judging by the patient's diagnosis, physical exam, and functional history, the patient has potential for functional progress which will result in measurable gains while on inpatient rehab.  These gains will be of substantial and practical use upon discharge  in facilitating mobility and self-care at the household level. 5. Physiatrist will provide 24 hour management of medical needs as well as oversight of the therapy plan/treatment and provide guidance as appropriate regarding the interaction of the two. 6. 24 hour rehab nursing will assist with bladder management, bowel management, safety, skin/wound care, disease management, medication administration, pain management and patient education  and help integrate therapy concepts, techniques,education, etc. 7. PT will assess and treat for/with: Lower extremity strength, range of motion, stamina, balance, functional mobility, safety, adaptive techniques and equipment, visual-spatial awareness, cognitive perceptual  awareness, family ed.   Goals are: supervision. 8. OT will assess and treat for/with: ADL's, functional mobility, safety, upper extremity strength, adaptive techniques and equipment, NMR, cognitive perceptual awareness, community reintegration, caregiver ed, stroke ed.   Goals are: supervision. Therapy may proceed with showering this patient. 9. SLP will assess and treat for/with: language, cognition, communication, swallowing.  Goals are: supervision to min assist. 10. Case Management and Social Worker will assess and treat for psychological issues and discharge planning. 11. Team conference will be held weekly to assess progress toward goals and to determine barriers to discharge. 12. Patient will receive at least 3 hours of therapy per day at least 5 days per week. 13. ELOS: 7-11 days       14. Prognosis:  excellent     Meredith Staggers, MD, [facility] 02/22/2015   02/22/2015

## 2015-02-22 NOTE — Care Management Important Message (Signed)
Important Message  Patient Details  Name: Katherine Cooke MRN: [id number] Date of Birth: Oct 05, 1925   Medicare Important Message Given:  Yes-fourth notification given    [name] 02/22/2015, 9:57 AM

## 2015-02-22 NOTE — Progress Notes (Signed)
ANTICOAGULATION CONSULT NOTE - Follow Up  Pharmacy Consult for Enoxaparin Indication: Hypercoag state due to metastatic cancer  Allergies  Allergen Reactions  . Lyrica [Pregabalin] Shortness Of Breath   Patient Measurements: Height: '4\' 11"'$  (149.9 cm) Weight: 103 lb (46.72 kg) IBW/kg (Calculated) : 43.2  Vital Signs: Temp: 98.8 F (37.1 C) (09/19 0523) Temp Source: Oral (09/19 0523) BP: 131/58 mmHg (09/19 0523) Pulse Rate: 111 (09/19 0523)  Labs:  Recent Labs  02/20/15 0617 02/20/15 0944 02/22/15 0534  HGB  --  10.6* 10.5*  HCT  --  30.9* 31.6*  PLT  --  405* 506*  CREATININE 0.63 0.61 0.68   Estimated Creatinine Clearance: 33.8 mL/min (by C-G formula based on Cr of 0.68).  Medical History: Past Medical History  Diagnosis Date  . Hypertension   . Spinal stenosis   . Basal cell carcinoma of nose 2005  . Squamous cell carcinoma of left hip 2005  . Colon cancer dx'd 02/16/15  . Diabetes mellitus without complication   . Glaucoma   . Macular degeneration    Assessment: 81 yo F presents on 9/11 with abdominal pain. PMH includes basal cell carcinoma of nose, squamous cell carcinoma of hip. On 9/13 she had AMS and R sided facial droop. MRI of the brain revealed multifocal areas of acute infarction. Deemed to have hypercoagulability secondary to metastatic cancer as the probable etiology of her embolic strokes. Pharmacy consulted to dose enoxaparin for hypercoagulability and embolic stroke. SCr stable, CrCl ~55m/min. Weight is 47kg. Due to borderline renal function and borderline weight will use '1mg'$ /kg [place] Q24 dosing.  Her CBC is stable this morning with H/H of 10.5/31.6.  Her platelets are 506K.  She is without noted bleeding complications, currently receiving Aspirin and LMWH.  Noted plans to improve nutrition prior to any surgery.  Renal function remains stable.  Goal of Therapy:  Monitor platelets by anticoagulation protocol: Yes   Plan:  Continue enoxaparin '45mg'$  Roberts  Q24 Monitor CBC, s/s of bleed    NRober Minion PharmD., MS Clinical Pharmacist Pager:  [phone] you for allowing pharmacy to be part of this patients care team. 02/22/2015,9:00 AM

## 2015-02-22 NOTE — Progress Notes (Signed)
0800 Patient c/o chest pain but unable to described the type of pain she is experiencing. She verbalized that she can't really swallow this morning. Family at bedside. O2 sat at 98% RA. Ambulate patient in chair. IV pain med admin per request.    1000 Patient noted in much better spirit at this time. Able to eat. Family present. MD at bedside. Will continue to monitor.   Ave Filter, RN

## 2015-02-22 NOTE — Progress Notes (Signed)
I have insurance approval and bed available to admit pt to inpt rehab today. I have contacted MD, RN CM and SW and will make the arrangements to admit today. [phone]

## 2015-02-22 NOTE — Discharge Summary (Signed)
Physician Discharge Summary  Katherine Cooke BZJ:[id number] DOB: Jun 07, 1925 DOA: 02/13/2015  PCP: Idamae Schuller, MD  Admit date: 02/13/2015 Discharge date: 02/22/2015  Time spent: 45 minutes  Recommendations for Outpatient Follow-up:  1. Patient will need close follow-up with general surgery as an outpatient-I will forward a note to Dr. Rosendo Gros the patient should undergo consideration for colectomy at that time and will need to be bridged off of both aspirin 5 days prior to procedure and switched to IV heparin prior to that 2. Recommend continue MiraLAX 3 times a day 17 g to ensure one to 2 loose stools a day as she has partial obstruction and recommended very soft diet 3. Patient will need to continue both aspirin 325 as well as Lovenox weight dosed for hypercoagulable state. Please get a CBC and differential in about one week 4. Patient is being evaluated and will probably be discharged to [facility]   Discharge Diagnoses:  Principal Problem:   Colonic mass Active Problems:   Hypertension   Osteopenia   Leukocytosis   Anemia of chronic disease   CVA (cerebral infarction)   Stroke with cerebral ischemia   Malignancy   Discharge Condition: Fair  Diet recommendation: Heart healthy  Filed Weights   02/13/15 2116 02/15/15 1448  Weight: 48.535 kg (107 lb) 46.72 kg (103 lb)    History of present illness: 81?   hypertension,   spinal stenosis, admitted to the medicine service on 02/14/2015 presenting with complaints of lower abdominal pain. She reported having a small amount of blood morning of admission.    CT scan of abdomen and pelvis that showed masslike soft tissue involving the sigmoid colon that was concerning for malignancy. GI was consulted.   On 02/15/2015 she underwent sigmoidoscopy with biopsy. Pathology reported adenocarcinoma.   General surgery was consulted regarding possibility of surgical excision of mass.    02/16/2015 she had mental  status change + right-sided facial droop with associated right-sided weakness.  MRI of the brain was ordered which unfortunately revealed multifocal areas of acute infarction affecting cerebellum, left pons, left thalamus and left posterior parietal and occipital regions. Dr. Armida Sans of neurology who recommended starting aspirin 325 mg by mouth every daily and transferring patient to [facility] for further evaluation and treatment.   ultimately felt she might have hypercoagulability 2/2 to CVA and started on lovenox Weight based andd ASA 325 Felt in setting of acute CVa would be HI RISK for immediate surgery/colectoym and i had a discussion in detail with the family regarding this and risks of surgery I asked Dr. Marin Olp to weigh in from an Oncology perspective and he agreed best option was to hold off on surgery for now Patient will likely need skilled therapy care and potential rehabilitation   Hospital Course:    Acute CVA. -9/13 = mental status changes. CT scan of brain did not show acute intracranial abnormalities however further workup with MRI of brain showed multifocal areas of acute infarct. -CT angiogram done as well -results of transthoracic echocardiogram, carotid Dopplers, MRA, bilateral lower extremity venous Dopplers are all neg -neurology who recommended initiating aspirin 325 mg by mouth daily --It is possible underlying malignancy led to a hypercoagulable state, ultimately leading to stroke of thromboembolic phenomenon. -due to hypercoagulable state also on lovenox 45 U daily which she will contuinue ongoing  2.  Poorly diff Ca-Unclear if Thyroid -Patient diagnosed with adenocarcinoma affecting sigmoid during this hospitalization, undergoing a sigmoidoscopy on 02/15/2015 with biopsies of mass=poorly  differentiated Ca -transiently on ciprofloxacin and Flagyl given concerns for necrosis and infection within the area of mass - general surgery d/c Abx on 9/14 -Given  acute CVA surgery will need to be postponed -Gen surgery feels it is reasonable to feed patient as long as we can keep stools soft and i started a low fiber diet 9/16 and is passing stool comfortably -She will need OP follow up for consideration of surgery beyond 2 weeks from time of acute CVA -I will forward a note to Dr. Rosendo Gros for consideration of this  3.  Hyponatremia. -Likely secondary to prerenal azotemia -Sodium improved from 133-136  4. Leukocytosis + mild thromobocytosis Unclear etiology--could be subclinical aspiration Afebrile currently abx d/c 9/13  5.Hypertension. -Blood pressures are stable  6. AKI Initial creat 1.51 On saline-Continue and resolving to <5/0.69  Consultants:  Neurology  Gen surgery  GI Dr. Cristina Gong  Oncology Dr. Earlie Server   Antibiotics:  Ciprofloxacin  Flagyl   Discharge Exam: Filed Vitals:   02/22/15 0523  BP: 131/58  Pulse: 111  Temp: 98.8 F (37.1 C)  Resp: 18    General: Alert pleasant oriented no apparent distress Cardiovascular: S1-S2 no murmur rub or gallop Respiratory: Clinically clear Abdomen soft nontender nondistended Moving all 4 limbs equally without deficit   Discharge Instructions    Current Discharge Medication List    START taking these medications   Details  acetaminophen (TYLENOL) 325 MG tablet Take 2 tablets (650 mg total) by mouth every 6 (six) hours as needed for moderate pain (headache).    diphenhydrAMINE (BENADRYL) 12.5 MG/5ML elixir Take 5 mLs (12.5 mg total) by mouth every 8 (eight) hours as needed for itching. Qty: 120 mL, Refills: 0    enoxaparin (LOVENOX) 150 MG/ML injection Inject 0.31 mLs (45 mg total) into the skin daily. Qty: 1 Syringe, Refills: 0    feeding supplement, ENSURE ENLIVE, (ENSURE ENLIVE) LIQD Take 237 mLs by mouth 2 (two) times daily between meals. Qty: 237 mL, Refills: 12    polyvinyl alcohol (LIQUIFILM TEARS) 1.4 % ophthalmic solution Place 1 drop into both eyes as  needed for dry eyes. Qty: 15 mL, Refills: 0      CONTINUE these medications which have CHANGED   Details  aspirin 325 MG tablet Take 1 tablet (325 mg total) by mouth daily.    polyethylene glycol (MIRALAX / GLYCOLAX) packet Take 17 g by mouth 2 (two) times daily. Qty: 28 each, Refills: 0      CONTINUE these medications which have NOT CHANGED   Details  Ascorbic Acid (VITAMIN C PO) Take 1 tablet by mouth daily.     bisacodyl (DULCOLAX) 5 MG EC tablet Take 5 mg by mouth daily as needed for mild constipation or moderate constipation.    Ca Carbonate-Mag Hydroxide (ROLAIDS PO) Take 2 tablets by mouth 2 (two) times daily as needed (indigestion).    cholecalciferol (VITAMIN D) 1000 UNITS tablet Take 1,000 Units by mouth daily.    cycloSPORINE (RESTASIS) 0.05 % ophthalmic emulsion Place 1 drop into both eyes 2 (two) times daily.    diclofenac sodium (VOLTAREN) 1 % GEL Apply 4 g topically daily as needed (pain).     ibandronate (BONIVA) 150 MG tablet Take 150 mg by mouth every 30 (thirty) days. Take in the morning with a full glass of water, on an empty stomach, and do not take anything else by mouth or lie down for the next 30 min.    MAGNESIUM PO Take 1 tablet  by mouth daily.     montelukast (SINGULAIR) 10 MG tablet Take 10 mg by mouth at bedtime. Refills: 3    Multiple Vitamins-Minerals (EYE VITAMINS PO) Take by mouth.    Omega-3 Fatty Acids (FISH OIL PO) Take 2 capsules by mouth 2 (two) times daily.       STOP taking these medications     ALPRAZolam (XANAX) 0.25 MG tablet      losartan-hydrochlorothiazide (HYZAAR) 100-12.5 MG per tablet      triazolam (HALCION) 0.25 MG tablet        Allergies  Allergen Reactions  . Lyrica [Pregabalin] Shortness Of Breath   Follow-up Information    Follow up with [facility], [place]. Go on 02/24/2015.   Specialty:  Internal Medicine   Why:  at 2:30pm   For Post Hospitalization Followup, You will see Lillia Carmel information:    [address] [place] [address] [phone]       Follow up with Pedro Earls, MD On 03/12/2015.   Specialty:  General Surgery   Why:  11:45am, arrive no later than 11:15am for paperwork   Contact information:   [place] [id number] [phone]        The results of significant diagnostics from this hospitalization (including imaging, microbiology, ancillary and laboratory) are listed below for reference.    Significant Diagnostic Studies: Ct Angio Head W/cm &/or Wo Cm  02/17/2015   CLINICAL DATA:  Stroke with cerebral ischemia. Difficulty speaking. Recent diagnosis of colon cancer.  EXAM: CT ANGIOGRAPHY HEAD AND NECK  TECHNIQUE: Multidetector CT imaging of the head and neck was performed using the standard protocol during bolus administration of intravenous contrast. Multiplanar CT image reconstructions and MIPs were obtained to evaluate the vascular anatomy. Carotid stenosis measurements (when applicable) are obtained utilizing NASCET criteria, using the distal internal carotid diameter as the denominator.  CONTRAST:  42m OMNIPAQUE IOHEXOL 350 MG/ML SOLN  COMPARISON:  MRI of the brain 02/17/2015.  FINDINGS: CT HEAD  Brain: The acute left thalamic infarct is less well seen by CT. Mild periventricular white matter changes are again noted bilaterally. No acute cortical infarct or hemorrhage is present. The ventricles are stable. No significant extra-axial fluid collection is present. Acute and remote lacunar infarcts are again noted in the cerebellum.  Calvarium and skull base: Within normal limits.  Paranasal sinuses: Cleared  Orbits: Bilateral lens replacements are noted. The globes and orbits are otherwise intact.  CTA NECK  Aortic arch: Atherosclerotic calcifications are present at the origins of the left common carotid artery and the left subclavian artery without significant stenosis. Minimal calcification is present along the undersurface of the aortic  arch.  Right carotid system: Moderate tortuosity is present in the right common carotid artery without significant stenosis. Dense calcifications are present at the right carotid bifurcation. There is mild narrowing, less than 50% relative to the more distal vessel. The cervical right ICA is also tortuous without significant stenosis.  Left carotid system: The left common carotid artery is tortuous without significant stenosis. Dense atherosclerotic calcifications are present at the carotid bifurcation without significant stenosis. There is significant tortuosity of the cervical left ICA without significant stenosis.  Vertebral arteries:A 50% stenosis is present in the right subclavian artery just beyond the bifurcation of the carotid artery. Dense calcifications are present at the origins of the vertebral arteries bilaterally. Mild to moderate stenoses are worse on the right. There is significant tortuosity of the proximal vertebral  arteries bilaterally without additional significant stenoses in the cervical vertebral arteries. The left vertebral artery is slightly dominant to the right.  Skeleton: Bone windows demonstrate multilevel endplate degenerative change. There is chronic loss of height at C5-6 and C6-7. Grade 1 anterolisthesis is degenerative at C3-4 and C4-5. No focal lytic or blastic lesions are present.  Other neck: Mild dependent atelectasis is present at the lung apices. No significant cervical adenopathy is present. Salivary glands are within normal limits. The thyroid is unremarkable.  CTA HEAD  Anterior circulation: There is mild ectasia of the internal carotid arteries bilaterally from the high cervical segments through the ICA termini. The A1 and M1 segments are within normal limits. The anterior communicating artery is patent. MCA bifurcations are intact bilaterally. There is some attenuation of distal MCA branch vessels bilaterally without significant proximal stenosis or occlusion.  Posterior  circulation: The left vertebral artery is the dominant vessel. PICA origins are visualized and within normal limits. The basilar artery is within normal limits. Both posterior cerebral arteries are of fetal type with small contribution from the P1 segments.  Venous sinuses: The dural sinuses are patent. The straight sinus and deep cerebral veins are patent.  Anatomic variants: Bilateral fetal type posterior cerebral arteries with small P1 segments.  Delayed phase: No pathologic enhancement is present.  IMPRESSION: 1. Acute/subacute infarcts of the left thalamus and right cerebellum. 2. Moderate stenoses at the origins of the vertebral arteries bilaterally, worse on the right. 3. Tortuosity of the vertebral arteries without other focal stenoses. 4. The posterior cerebral arteries are of fetal type with a small P1 segments. 5. Marked tortuosity of the common and internal carotid arteries bilaterally without significant stenoses. 6. Dense atherosclerotic calcifications at the carotid bifurcations bilaterally without significant stenosis. 7. Multilevel spondylosis of the cervical spine.   Electronically Signed   By: San Morelle M.D.   On: 02/17/2015 18:34   Dg Chest 1 View  02/17/2015   CLINICAL DATA:  Delirium and weakness. 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 History of colon carcinoma.  EXAM: CHEST  1 VIEW  COMPARISON:  12/28/2010  FINDINGS: Cardiac silhouette is mildly enlarged. No mediastinal or hilar masses or evidence of adenopathy.  Lungs are clear.  No pleural effusion or pneumothorax.  Bony thorax is demineralized but grossly intact.  IMPRESSION: No active disease.   Electronically Signed   By: Lajean Manes M.D.   On: 02/17/2015 10:41   Ct Head Wo Contrast  02/17/2015   CLINICAL DATA:  New onset confusion tonight with right facial droop and blurred vision right eye. Recent diagnosis of colon cancer.  EXAM: CT  HEAD WITHOUT CONTRAST  TECHNIQUE: Contiguous axial images were obtained from the base of the skull through the vertex without intravenous contrast.  COMPARISON:  02/23/2005  FINDINGS: Ventricles, cisterns and other CSF spaces are within normal. Mild bilateral basal ganglia calcifications are present. There is no mass, mass effect, shift of midline structures or acute hemorrhage. There is no evidence to suggest acute infarction. Remaining bones and soft tissues are within normal.  IMPRESSION: No acute intracranial findings.   Electronically Signed   By: Marin Olp M.D.   On: 02/17/2015 00:00   Ct Angio Neck W/cm &/or Wo/cm  02/17/2015   CLINICAL DATA:  Stroke with cerebral ischemia. Difficulty speaking. Recent diagnosis of colon cancer.  EXAM: CT ANGIOGRAPHY HEAD AND NECK  TECHNIQUE: Multidetector CT imaging of the  head and neck was performed using the standard protocol during bolus administration of intravenous contrast. Multiplanar CT image reconstructions and MIPs were obtained to evaluate the vascular anatomy. Carotid stenosis measurements (when applicable) are obtained utilizing NASCET criteria, using the distal internal carotid diameter as the denominator.  CONTRAST:  69m OMNIPAQUE IOHEXOL 350 MG/ML SOLN  COMPARISON:  MRI of the brain 02/17/2015.  FINDINGS: CT HEAD  Brain: The acute left thalamic infarct is less well seen by CT. Mild periventricular white matter changes are again noted bilaterally. No acute cortical infarct or hemorrhage is present. The ventricles are stable. No significant extra-axial fluid collection is present. Acute and remote lacunar infarcts are again noted in the cerebellum.  Calvarium and skull base: Within normal limits.  Paranasal sinuses: Cleared  Orbits: Bilateral lens replacements are noted. The globes and orbits are otherwise intact.  CTA NECK  Aortic arch: Atherosclerotic calcifications are present at the origins of the left common carotid artery and the left subclavian  artery without significant stenosis. Minimal calcification is present along the undersurface of the aortic arch.  Right carotid system: Moderate tortuosity is present in the right common carotid artery without significant stenosis. Dense calcifications are present at the right carotid bifurcation. There is mild narrowing, less than 50% relative to the more distal vessel. The cervical right ICA is also tortuous without significant stenosis.  Left carotid system: The left common carotid artery is tortuous without significant stenosis. Dense atherosclerotic calcifications are present at the carotid bifurcation without significant stenosis. There is significant tortuosity of the cervical left ICA without significant stenosis.  Vertebral arteries:A 50% stenosis is present in the right subclavian artery just beyond the bifurcation of the carotid artery. Dense calcifications are present at the origins of the vertebral arteries bilaterally. Mild to moderate stenoses are worse on the right. There is significant tortuosity of the proximal vertebral arteries bilaterally without additional significant stenoses in the cervical vertebral arteries. The left vertebral artery is slightly dominant to the right.  Skeleton: Bone windows demonstrate multilevel endplate degenerative change. There is chronic loss of height at C5-6 and C6-7. Grade 1 anterolisthesis is degenerative at C3-4 and C4-5. No focal lytic or blastic lesions are present.  Other neck: Mild dependent atelectasis is present at the lung apices. No significant cervical adenopathy is present. Salivary glands are within normal limits. The thyroid is unremarkable.  CTA HEAD  Anterior circulation: There is mild ectasia of the internal carotid arteries bilaterally from the high cervical segments through the ICA termini. The A1 and M1 segments are within normal limits. The anterior communicating artery is patent. MCA bifurcations are intact bilaterally. There is some  attenuation of distal MCA branch vessels bilaterally without significant proximal stenosis or occlusion.  Posterior circulation: The left vertebral artery is the dominant vessel. PICA origins are visualized and within normal limits. The basilar artery is within normal limits. Both posterior cerebral arteries are of fetal type with small contribution from the P1 segments.  Venous sinuses: The dural sinuses are patent. The straight sinus and deep cerebral veins are patent.  Anatomic variants: Bilateral fetal type posterior cerebral arteries with small P1 segments.  Delayed phase: No pathologic enhancement is present.  IMPRESSION: 1. Acute/subacute infarcts of the left thalamus and right cerebellum. 2. Moderate stenoses at the origins of the vertebral arteries bilaterally, worse on the right. 3. Tortuosity of the vertebral arteries without other focal stenoses. 4. The posterior cerebral arteries are of fetal type with a small P1 segments. 5. Marked  tortuosity of the common and internal carotid arteries bilaterally without significant stenoses. 6. Dense atherosclerotic calcifications at the carotid bifurcations bilaterally without significant stenosis. 7. Multilevel spondylosis of the cervical spine.   Electronically Signed   By: San Morelle M.D.   On: 02/17/2015 18:34   Ct Chest W Contrast  02/19/2015   CLINICAL DATA:  81 year old who presented this admission with abdominal pain a constipation, found to have a soft tissue mass in the pelvis associated with the sigmoid colon, pathology demonstrating poorly differentiated adenocarcinoma with possible lung or thyroid primary. Staging CT to evaluate for possible primary lesion or metastatic disease.  EXAM: CT CHEST WITH CONTRAST  TECHNIQUE: Multidetector CT imaging of the chest was performed during intravenous contrast administration.  CONTRAST:  75 ml Omnipaque 300 IV.  COMPARISON:  No prior chest CT.  FINDINGS: Lungs: No pulmonary parenchymal nodules or  masses to suggest primary bronchogenic carcinoma or metastatic disease. Emphysematous changes throughout both lungs. Mild atelectasis involving the lower lobes. No confluent airspace consolidation. No evidence of interstitial lung disease.  Trachea/bronchi: Trachea and mainstem bronchi patent. Calcified tracheobronchial cartilages. Mucous plugging involving the right lower lobe segmental bronchi. No endobronchial soft tissue mass.  Pleura:  Small bilateral pleural effusions.  No pleural masses.  Mediastinum:  No mediastinal masses.  Heart and Vascular: Heart moderately enlarged. Extensive mitral annular calcification. Mild aortic valvular calcification. Severe LAD coronary atherosclerosis. No pericardial effusion. Moderate to severe atherosclerosis involving the thoracic and upper abdominal aorta without evidence of aneurysm or dissection.  Lymphatic: No pathologic mediastinal, hilar or axillary lymphadenopathy.  Other findings: None.  Visualized lower neck: Thyroid gland normal in appearance. No supraclavicular lymphadenopathy.  Visualized upper abdomen: No new finding since the CT abdomen and pelvis 02/13/2015.  Musculoskeletal: No evidence of osseous metastatic disease. Degenerative disc disease and spondylosis throughout the thoracic spine.  IMPRESSION: 1. No evidence of a primary malignancy or metastatic disease in the thorax. Specifically, no lung mass and no thyroid mass. 2. COPD/emphysema. Mild atelectasis in the lower lobes. Mucous plugging involving segmental right lower lobe bronchi. Small bilateral pleural effusions. No acute cardiopulmonary disease otherwise. 3. Moderate cardiomegaly. Extensive mitral annular calcification. Mild aortic valvular calcification. Severe LAD coronary atherosclerosis.   Electronically Signed   By: Evangeline Dakin M.D.   On: 02/19/2015 10:24   Mr Brain Wo Contrast  02/17/2015   CLINICAL DATA:  Confusion with slurred speech and difficulty with word finding. Blurred vision.  Symptoms began 02/16/2015.  EXAM: MRI HEAD WITHOUT CONTRAST  TECHNIQUE: Multiplanar, multiecho pulse sequences of the brain and surrounding structures were obtained without intravenous contrast.  COMPARISON:  CT head 02/16/2015.  FINDINGS: Multifocal areas of restricted diffusion representing acute infarction are observed. These are located in multiple vascular territories. Subcentimeter BILATERAL cerebellar cortical and subcortical infarcts are seen. Linear LEFT paramedian acute pontine infarct. Scattered medial LEFT parietal and lateral LEFT occipital cortical and subcortical acute infarcts. Largest area of infarction affects the LEFT thalamus, 10 x 14 mm. All of these acute infarcts lie within the distribution of the BILATERAL vertebral arteries and basilar artery, and felt to represent posterior circulation event.  No acute or chronic hemorrhage, mass lesion, hydrocephalus, or extra-axial fluid.  Generalized atrophy. Mild subcortical and periventricular T2 and FLAIR hyperintensities, likely chronic microvascular ischemic change. Flow voids are maintained in the carotid, basilar, and both distal vertebral arteries.  BILATERAL cataract extraction. Chronic sinus disease. No mastoid fluid. No osseous findings. No midline abnormality. Compared with prior CT, the infarcts are  not visible on that study.  IMPRESSION: Multifocal areas of acute infarction affecting the cerebellum, LEFT paramedian pons, LEFT thalamus, and LEFT posterior parietal and occipital regions. Posterior circulation event is suspected.  Generalized atrophy and mild small vessel disease, not unexpected for age.  These results will be called to the ordering clinician or representative by the Radiologist Assistant, and communication documented in the PACS or zVision Dashboard.   Electronically Signed   By: Staci Righter M.D.   On: 02/17/2015 11:01   Ct Abdomen Pelvis W Contrast  02/13/2015   CLINICAL DATA:  Left lower quadrant pain for 1 week. New  right lower quadrant pain. Leukocytosis. 20 pound weight loss over the past 1 year.  EXAM: CT ABDOMEN AND PELVIS WITH CONTRAST  TECHNIQUE: Multidetector CT imaging of the abdomen and pelvis was performed using the standard protocol following bolus administration of intravenous contrast.  CONTRAST:  41m OMNIPAQUE IOHEXOL 300 MG/ML SOLN, 224mOMNIPAQUE IOHEXOL 300 MG/ML SOLN  COMPARISON:  Abdominal radiographs 10/13/2014  FINDINGS: Minimal dependent atelectasis is present in the lung bases. There are several small right lower lobe lung nodules which measure up to 3 mm in size (series 7, image 1). Mitral annular calcification is partially visualized.  A 1.4 cm lesion in the lateral segment of the left hepatic lobe is hypoattenuating relative to adjacent liver but demonstrates attenuation greater water. Than there is minimal central intrahepatic biliary dilatation. The common bile duct is upper limits of normal in size, measuring 9 mm in diameter. The pancreatic duct is minimally prominent in the region of the head, measuring 3 mm. The gallbladder, spleen, and adrenal glands are unremarkable. Subcentimeter low-density lesions in the kidneys are too small to characterize but most likely represent cysts. There is a 2.4 cm cyst in the upper pole of the left kidney. There is also a 4 mm fat density lesion in the interpolar right kidney which may represent a tiny angiomyolipoma.  There is diffuse left-sided colonic diverticulosis. There is extensive masslike soft tissue thickening involving the sigmoid colon measuring approximately 7.6 x 7.7 x 6.3 cm in total, with a lower density component extending laterally and measuring 4.8 x 4.5 x 4.0 cm. There are multiple adjacent subcentimeter pericolonic lymph nodes. A larger left external iliac/ pericolonic lymph node measures 1.0 cm in short axis. Multiple mildly prominent left para-aortic lymph nodes measure up to 1.0 cm in short axis.  No intraperitoneal free air or free fluid  is seen. There is no evidence of bowel obstruction. There is rather extensive aortoiliac atherosclerotic calcification. The sigmoid colon the mass is inseparable from the uterine fundus and may involve the uterus directly. No suspicious lytic or blastic osseous lesions are identified. Advanced disc degeneration is present in the lower lumbar spine.  IMPRESSION: 1. Masslike soft tissue involving the sigmoid colon, most concerning for primary colonic malignancy with necrotic component or less likely abscess. While there is extensive left-sided colonic diverticulosis, this appears more complex and masslike than typical perforated diverticulitis and abscess formation. 2. Small pelvic and retroperitoneal lymph nodes, indeterminate but could represent nodal metastatic disease. 3. 1.4 cm hypoattenuating liver lesion, indeterminate although a metastasis is not excluded. 4. Tiny right lower lobe lung nodules measuring 2-3 mm in size. These results were called by telephone at the time of interpretation on 02/13/2015 at 5:51 pm to Dr. RATheotis Burrow who verbally acknowledged these results.   Electronically Signed   By: AlLogan Bores.D.   On: 02/13/2015 17:55  Microbiology: No results found for this or any previous visit (from the past 240 hour(s)).   Labs: Basic Metabolic Panel:  Recent Labs Lab 02/16/15 0421 02/17/15 0413 02/18/15 0718 02/20/15 0617 02/20/15 0944 02/22/15 0534  NA 133* 136 133*  --  135 134*  K 3.7 3.5 3.3*  --  4.1 4.5  CL 103 106 102  --  102 101  CO2 '24 23 24  '$ --  24 24  GLUCOSE 97 96 118*  --  96 92  BUN 7 <5* <5*  --  <5* 7  CREATININE 0.72 0.60 0.69 0.63 0.61 0.68  CALCIUM 8.0* 8.0* 8.2*  --  8.4* 9.0   Liver Function Tests:  Recent Labs Lab 02/20/15 0944 02/22/15 0534  AST 39 70*  ALT 12* 33  ALKPHOS 65 77  BILITOT 0.3 0.3  PROT 5.7* 5.9*  ALBUMIN 2.2* 2.5*   No results for input(s): LIPASE, AMYLASE in the last 168 hours. No results for input(s): AMMONIA in  the last 168 hours. CBC:  Recent Labs Lab 02/17/15 0413 02/18/15 0718 02/19/15 0608 02/20/15 0944 02/22/15 0534  WBC 12.2* 11.7* 12.7* 12.6* 14.6*  NEUTROABS  --   --   --  9.4*  --   HGB 9.4* 10.1* 11.3* 10.6* 10.5*  HCT 28.2* 30.6* 33.1* 30.9* 31.6*  MCV 98.3 96.8 97.1 97.2 97.5  PLT 404* 401* 432* 405* 506*   Cardiac Enzymes: No results for input(s): CKTOTAL, CKMB, CKMBINDEX, TROPONINI in the last 168 hours. BNP: BNP (last 3 results) No results for input(s): BNP in the last 8760 hours.  ProBNP (last 3 results) No results for input(s): PROBNP in the last 8760 hours.  CBG:  Recent Labs Lab 02/16/15 2243  GLUCAP 98       Signed:  Nita Sells  [facility] 02/22/2015, 9:47 AM

## 2015-02-22 NOTE — Progress Notes (Signed)
Physical Medicine and Rehabilitation Consult   Reason for Consult: Difficulty with speech and impaired balance Referring Physician: Dr. Verlon Au.    HPI: Katherine Cooke is a 81 y.o. female with history of HTN, glaucoma, macular degeneration, BCC, abdominal mass X years who was admitted on 02/13/15 with progressive abdominal pain X 1 week and constipation. She was found to have extensive left colonic diverticulosis with complex mas 7.6 X 7.6 x 6.3 cm , 1.4 cm liver lesion and small pelvic and retroperitoneal nodes. She was started on clear liquids/antibiotics due to leucocytosis and concerns of diverticulitis and sigmoidoscopy with biopsy positive for poorly differentiated adenocarcinoma which was likely arising from outside the colon. Pathology with possibility of lung or thyroid primary. She has been evaluated by [facility] and Dr. Earlie Server for input. Patient to be presented at GI conference for input and to continue on liquid diet as patient not fully obstructed. On 09/13, patient was noted to have confusion with difficulty talking. MRI brain done revealing multifocal areas of acute infarct affecting cerebellum, left paramedian pons, left thalamus, left posterior parietal and occipital regions question embolic from posterior circulation. neurology consulted and recommended full work up with CTA brain/neck done revealing acute/subacute infarcts left thalamus and right cerebellum and moderate stenosis at origins of B-VA worse on right. 2D echo with EF 50-55% with moderate mitral calcification and grade 1 diastolic dysfunction.   Therapeutic dose of Lovenox added due to hypercoagulable state and neurology recommends holding off on urgent surgical procedure for 2 weeks and elective procedure for 6 months. Patient placed on ASA 458 mg for embolic stroke likely due to hypercoagulopathy from advanced malignancy and to avoid hypotension. She continues on clear liquids and surgery has signed off. CT chest ordered for  work up and pending. Therapy evaluations completed yesterday and CIR recommended for follow up therapy. Patient with resultant impaired balance, visual deficits, decreased activity tolerance, expressive aphasia with deficits in following multi-step commands. Patient and family would prefer CIR then transition to [facility].     Review of Systems  Constitutional: Positive for malaise/fatigue.  HENT: Negative for hearing loss.  Eyes: Positive for blurred vision. Negative for pain.   Minimal vision in right eye due to glaucoma.  Respiratory: Negative for cough, shortness of breath and wheezing.  Cardiovascular: Negative for chest pain and palpitations.  Gastrointestinal: Positive for abdominal pain (on and off for months) and constipation. Negative for heartburn and nausea.  Musculoskeletal: Negative for myalgias, back pain and joint pain.  Skin: Negative for itching and rash.  Neurological: Positive for speech change and weakness. Negative for dizziness, tingling and headaches.      Past Medical History  Diagnosis Date  . Hypertension   . Spinal stenosis   . Basal cell carcinoma of nose 2005  . Squamous cell carcinoma of left hip 2005  . Colon cancer dx'd 02/16/15  . Diabetes mellitus without complication   . Glaucoma   . Macular degeneration      Past Surgical History  Procedure Laterality Date  . Breast biopsy  1950's ; 1952 ; 1954    x 3 - Benign  . Tonsillectomy    . Flexible sigmoidoscopy N/A 02/15/2015    Procedure: FLEXIBLE SIGMOIDOSCOPY; Surgeon: Ronald Lobo, MD; Location: [facility]; Service: Endoscopy; Laterality: N/A;     Family History  Problem Relation Age of Onset  . Breast cancer Maternal Aunt   . Ovarian cancer Sister   . Heart disease Father   . Ulcers Father   .  COPD Mother      Social History: Widowed. Lives in independent living at [place]. Has  assistance with home management and goes to the dinning hall for most meals. She reports that she has never smoked. She has never used smokeless tobacco. She reports that she drinks alcohol daily. She reports that she does not use illicit drugs.   Allergies  Allergen Reactions  . Lyrica [Pregabalin] Shortness Of Breath    Medications Prior to Admission  Medication Sig Dispense Refill  . ALPRAZolam (XANAX) 0.25 MG tablet Take 0.25 mg by mouth daily as needed for anxiety or sleep.     . Ascorbic Acid (VITAMIN C PO) Take 1 tablet by mouth daily.     Marland Kitchen aspirin 81 MG tablet Take 81 mg by mouth daily.    . bisacodyl (DULCOLAX) 5 MG EC tablet Take 5 mg by mouth daily as needed for mild constipation or moderate constipation.    . Ca Carbonate-Mag Hydroxide (ROLAIDS PO) Take 2 tablets by mouth 2 (two) times daily as needed (indigestion).    . cholecalciferol (VITAMIN D) 1000 UNITS tablet Take 1,000 Units by mouth daily.    . cycloSPORINE (RESTASIS) 0.05 % ophthalmic emulsion Place 1 drop into both eyes 2 (two) times daily.    . diclofenac sodium (VOLTAREN) 1 % GEL Apply 4 g topically daily as needed (pain).     . ibandronate (BONIVA) 150 MG tablet Take 150 mg by mouth every 30 (thirty) days. Take in the morning with a full glass of water, on an empty stomach, and do not take anything else by mouth or lie down for the next 30 min.    Marland Kitchen losartan-hydrochlorothiazide (HYZAAR) 100-12.5 MG per tablet Take 1 tablet by mouth daily.     Marland Kitchen MAGNESIUM PO Take 1 tablet by mouth daily.     . montelukast (SINGULAIR) 10 MG tablet Take 10 mg by mouth at bedtime.  3  . Multiple Vitamins-Minerals (EYE VITAMINS PO) Take by mouth.    . Omega-3 Fatty Acids (FISH OIL PO) Take 2 capsules by mouth 2 (two) times daily.     . polyethylene glycol (MIRALAX / GLYCOLAX) packet Take 17 g by mouth daily as needed for mild constipation or  moderate constipation.    . triazolam (HALCION) 0.25 MG tablet Take 0.25 mg by mouth at bedtime as needed for sleep.       Home: Home Living Family/patient expects to be discharged to:: Private residence Living Arrangements: Alone Available Help at Discharge: Family Type of Home: Independent living facility Home Access: Level entry [place]: One level Bathroom Shower/Tub: Multimedia programmer: Handicapped height [place]: Grab bars - tub/shower, Grab bars - toilet Additional Comments: Pt lives in [place] at [place]. Lives With: Alone  Functional History: Prior Function Level of Independence: Independent Comments: Pt living on her own, ambulating to/from dining hall, driving. Functional Status:  Mobility: Bed Mobility Overal bed mobility: Needs Assistance Bed Mobility: Supine to Sit Supine to sit: HOB elevated, Min guard General bed mobility comments: pt in chair Transfers Overall transfer level: Needs assistance Equipment used: 1 person hand held assist Transfers: Sit to/from Stand, Stand Pivot Transfers Sit to Stand: Min assist Stand pivot transfers: Min assist Squat pivot transfers: Min assist, Mod assist General transfer comment: pt reliant on UEs, stood slowly, required steadying assist, but no assist to rise, initially reluctant to release arms of chair in standing Ambulation/Gait Ambulation/Gait assistance: Mod assist, +2 safety/equipment (chair following) Ambulation Distance (Feet): 30  Feet Assistive device: Rolling walker (2 wheeled) Gait Pattern/deviations: Step-through pattern, Decreased stride length, Decreased step length - right, Decreased dorsiflexion - right, Trunk flexed General Gait Details: cues for posture, lifting right LE for clearance, for eyes ahead. assist for balance, walker safety/progression    ADL: ADL Overall ADL's : Needs assistance/impaired Eating/Feeding: Set up, Sitting Eating/Feeding Details (indicate cue  type and reason): pt on liquid diet, poor appetite Grooming: Wash/dry hands, Wash/dry face, Sitting, Set up Upper Body Bathing: Minimal assitance, Sitting Lower Body Bathing: Minimal assistance, Sit to/from stand Upper Body Dressing : Minimal assistance, Sitting Lower Body Dressing: Minimal assistance, Sit to/from stand Lower Body Dressing Details (indicate cue type and reason): able to don and doff socks Toilet Transfer: Minimal assistance, Stand-pivot Toileting- Clothing Manipulation and Hygiene: Minimal assistance, Sitting/lateral lean, Sit to/from stand, Moderate assistance  Cognition: Cognition Overall Cognitive Status: Difficult to assess Orientation Level: Oriented to person, Oriented to place, Disoriented to time, Disoriented to situation Attention: Sustained Sustained Attention: Appears intact Awareness: Appears intact Cognition Arousal/Alertness: Awake/alert Behavior During Therapy: WFL for tasks assessed/performed Overall Cognitive Status: Difficult to assess Area of Impairment: Orientation, Attention, Following commands, Awareness, Safety/judgement Orientation Level: Disoriented to, Time, Situation Current Attention Level: Selective Memory: Decreased recall of precautions Following Commands: Follows multi-step commands inconsistently Awareness: Intellectual General Comments: Pt able to follow commands and answer most questions clearly. Cognition difficult to assess due to expressive aphasia. Difficult to assess due to: Impaired communication   Blood pressure 132/72, pulse 98, temperature 98.4 F (36.9 C), temperature source Oral, resp. rate 20, height '4\' 11"'$  (1.499 m), weight 46.72 kg (103 lb), SpO2 97 %. Physical Exam  Nursing note and vitals reviewed. Constitutional: She appears well-developed and well-nourished. She is sleeping. She is easily aroused.  Fatigued appearing elderly female.  HENT:  Head: Normocephalic and atraumatic.  Eyes: Right eye exhibits  abnormal extraocular motion. Right pupil is not reactive.  Right exophthalmus.  Neck: Normal range of motion. Neck supple.  Cardiovascular: Normal rate and regular rhythm.  Murmur heard. Respiratory: Effort normal and breath sounds normal. No respiratory distress. She has no wheezes.  GI: Soft. Bowel sounds are normal. She exhibits no distension. There is tenderness (RLQ to palpation).  Musculoskeletal: She exhibits no edema.  Neurological: She is alert and easily aroused. A cranial nerve deficit is present.  Soft voice. Oriented to place "hospital in [place]" and situation as "mass in stomach". Right facial weakness. Soft voice with expressive deficits. Able to follow simple one and two step commands with minimal cues. RUE with pronator drift and decrease in fine motor control. Vision affecting exam and tended to close right eye for focus. Right sided weakness 4/5 UE/LE. Sensation intact.  Skin: Skin is warm and dry. No rash noted. She is not diaphoretic. No erythema.  Psychiatric: Her affect is blunt. Her speech is delayed. She is slowed. Cognition and memory are impaired.  Patient is able to name simple objects such as pen and ring and watch but is unable to name stethoscope. Has some ataxia when reaching for objects on the right side.   Lab Results Last 24 Hours    Results for orders placed or performed during the hospital encounter of 02/13/15 (from the past 24 hour(s))  CBC Status: Abnormal   Collection Time: 02/19/15 6:08 AM  Result Value Ref Range   WBC 12.7 (H) 4.0 - 10.5 K/uL   RBC 3.41 (L) 3.87 - 5.11 MIL/uL   Hemoglobin 11.3 (L) 12.0 - 15.0 g/dL  HCT 33.1 (L) 36.0 - 46.0 %   MCV 97.1 78.0 - 100.0 fL   MCH 33.1 26.0 - 34.0 pg   MCHC 34.1 30.0 - 36.0 g/dL   RDW 12.9 11.5 - 15.5 %   Platelets 432 (H) 150 - 400 K/uL     Dg Chest 1 View  02/17/2015 CLINICAL DATA: Delirium and weakness. History of hypertension, spinal  stenosis, basal cell carcinoma of the nose and squamous cell carcinoma of the left hip. History of colon carcinoma. EXAM: CHEST 1 VIEW COMPARISON: 12/28/2010 FINDINGS: Cardiac silhouette is mildly enlarged. No mediastinal or hilar masses or evidence of adenopathy. Lungs are clear. No pleural effusion or pneumothorax. Bony thorax is demineralized but grossly intact. IMPRESSION: No active disease. Electronically Signed By: Lajean Manes M.D. On: 02/17/2015 10:41    Ct Angio Neck W/cm &/or Wo/cm  02/17/2015 CLINICAL DATA: Stroke with cerebral ischemia. Difficulty speaking. Recent diagnosis of colon cancer. EXAM: CT ANGIOGRAPHY HEAD AND NECK TECHNIQUE: Multidetector CT imaging of the head and neck was performed using the standard protocol during bolus administration of intravenous contrast. Multiplanar CT image reconstructions and MIPs were obtained to evaluate the vascular anatomy. Carotid stenosis measurements (when applicable) are obtained utilizing NASCET criteria, using the distal internal carotid diameter as the denominator. CONTRAST: 61m OMNIPAQUE IOHEXOL 350 MG/ML SOLN COMPARISON: MRI of the brain 02/17/2015. FINDINGS: CT HEAD Brain: The acute left thalamic infarct is less well seen by CT. Mild periventricular white matter changes are again noted bilaterally. No acute cortical infarct or hemorrhage is present. The ventricles are stable. No significant extra-axial fluid collection is present. Acute and remote lacunar infarcts are again noted in the cerebellum. Calvarium and skull base: Within normal limits. Paranasal sinuses: Cleared Orbits: Bilateral lens replacements are noted. The globes and orbits are otherwise intact. CTA NECK Aortic arch: Atherosclerotic calcifications are present at the origins of the left common carotid artery and the left subclavian artery without significant stenosis. Minimal calcification is present along the undersurface of the aortic arch. Right  carotid system: Moderate tortuosity is present in the right common carotid artery without significant stenosis. Dense calcifications are present at the right carotid bifurcation. There is mild narrowing, less than 50% relative to the more distal vessel. The cervical right ICA is also tortuous without significant stenosis. Left carotid system: The left common carotid artery is tortuous without significant stenosis. Dense atherosclerotic calcifications are present at the carotid bifurcation without significant stenosis. There is significant tortuosity of the cervical left ICA without significant stenosis. Vertebral arteries:A 50% stenosis is present in the right subclavian artery just beyond the bifurcation of the carotid artery. Dense calcifications are present at the origins of the vertebral arteries bilaterally. Mild to moderate stenoses are worse on the right. There is significant tortuosity of the proximal vertebral arteries bilaterally without additional significant stenoses in the cervical vertebral arteries. The left vertebral artery is slightly dominant to the right. Skeleton: Bone windows demonstrate multilevel endplate degenerative change. There is chronic loss of height at C5-6 and C6-7. Grade 1 anterolisthesis is degenerative at C3-4 and C4-5. No focal lytic or blastic lesions are present. Other neck: Mild dependent atelectasis is present at the lung apices. No significant cervical adenopathy is present. Salivary glands are within normal limits. The thyroid is unremarkable. CTA HEAD Anterior circulation: There is mild ectasia of the internal carotid arteries bilaterally from the high cervical segments through the ICA termini. The A1 and M1 segments are within normal limits. The anterior communicating artery is patent.  MCA bifurcations are intact bilaterally. There is some attenuation of distal MCA branch vessels bilaterally without significant proximal stenosis or occlusion. Posterior circulation:  The left vertebral artery is the dominant vessel. PICA origins are visualized and within normal limits. The basilar artery is within normal limits. Both posterior cerebral arteries are of fetal type with small contribution from the P1 segments. Venous sinuses: The dural sinuses are patent. The straight sinus and deep cerebral veins are patent. Anatomic variants: Bilateral fetal type posterior cerebral arteries with small P1 segments. Delayed phase: No pathologic enhancement is present. IMPRESSION: 1. Acute/subacute infarcts of the left thalamus and right cerebellum. 2. Moderate stenoses at the origins of the vertebral arteries bilaterally, worse on the right. 3. Tortuosity of the vertebral arteries without other focal stenoses. 4. The posterior cerebral arteries are of fetal type with a small P1 segments. 5. Marked tortuosity of the common and internal carotid arteries bilaterally without significant stenoses. 6. Dense atherosclerotic calcifications at the carotid bifurcations bilaterally without significant stenosis. 7. Multilevel spondylosis of the cervical spine. Electronically Signed By: San Morelle M.D. On: 02/17/2015 18:34    Mr Brain Wo Contrast  02/17/2015 CLINICAL DATA: Confusion with slurred speech and difficulty with word finding. Blurred vision. Symptoms began 02/16/2015. EXAM: MRI HEAD WITHOUT CONTRAST TECHNIQUE: Multiplanar, multiecho pulse sequences of the brain and surrounding structures were obtained without intravenous contrast. COMPARISON: CT head 02/16/2015. FINDINGS: Multifocal areas of restricted diffusion representing acute infarction are observed. These are located in multiple vascular territories. Subcentimeter BILATERAL cerebellar cortical and subcortical infarcts are seen. Linear LEFT paramedian acute pontine infarct. Scattered medial LEFT parietal and lateral LEFT occipital cortical and subcortical acute infarcts. Largest area of infarction affects the LEFT  thalamus, 10 x 14 mm. All of these acute infarcts lie within the distribution of the BILATERAL vertebral arteries and basilar artery, and felt to represent posterior circulation event. No acute or chronic hemorrhage, mass lesion, hydrocephalus, or extra-axial fluid. Generalized atrophy. Mild subcortical and periventricular T2 and FLAIR hyperintensities, likely chronic microvascular ischemic change. Flow voids are maintained in the carotid, basilar, and both distal vertebral arteries. BILATERAL cataract extraction. Chronic sinus disease. No mastoid fluid. No osseous findings. No midline abnormality. Compared with prior CT, the infarcts are not visible on that study. IMPRESSION: Multifocal areas of acute infarction affecting the cerebellum, LEFT paramedian pons, LEFT thalamus, and LEFT posterior parietal and occipital regions. Posterior circulation event is suspected. Generalized atrophy and mild small vessel disease, not unexpected for age. These results will be called to the ordering clinician or representative by the Radiologist Assistant, and communication documented in the PACS or zVision Dashboard. Electronically Signed By: Staci Righter M.D. On: 02/17/2015 11:01    Assessment/Plan: Diagnosis: Right hemiparesis, Right hemiataxia and aphasia, bilateral infarcts affecting left thalamus left paramedian pons and right cerebellum 1. Does the need for close, 24 hr/day medical supervision in concert with the patient's rehab needs make it unreasonable for this patient to be served in a less intensive setting? Yes 2. Co-Morbidities requiring supervision/potential complications: Diabetes, hypertension, adenocarcinoma of unknown primary with extraluminal Colon Metastasis 3. Due to bladder management, bowel management, safety, skin/wound care, disease management, medication administration, pain management and patient education, does the patient require 24 hr/day rehab nursing? Yes 4. Does the patient  require coordinated care of a physician, rehab nurse, PT (1-2 hrs/day, 5 days/week), OT (1-2 hrs/day, 5 days/week) and SLP (0.5-1 hrs/day, 5 days/week) to address physical and functional deficits in the context of the above medical diagnosis(es)? Yes  Addressing deficits in the following areas: balance, endurance, locomotion, strength, transferring, bowel/bladder control, bathing, dressing, feeding, grooming, toileting, cognition, speech, language and psychosocial support 5. Can the patient actively participate in an intensive therapy program of at least 3 hrs of therapy per day at least 5 days per week? Yes 6. The potential for patient to make measurable gains while on inpatient rehab is good 7. Anticipated functional outcomes upon discharge from inpatient rehab are supervision with PT, supervision with OT, supervision with SLP. 8. Estimated rehab length of stay to reach the above functional goals is: 12-16 days 9. Does the patient have adequate social supports and living environment to accommodate these discharge functional goals? Yes 10. Anticipated D/C setting: Home 11. Anticipated post D/C treatments: [place] therapy 12. Overall Rehab/Functional Prognosis: good  RECOMMENDATIONS: This patient's condition is appropriate for continued rehabilitative care in the following setting: CIR Patient has agreed to participate in recommended program. Yes Note that insurance prior authorization may be required for reimbursement for recommended care.  Comment: Will likely need at least assisted-living level at [facility], Would go home and then follow up with surgery as an outpatient    02/19/2015       Revision History     Date/Time User Provider Type Action   02/19/2015 9:48 AM Charlett Blake, MD Physician Sign   02/19/2015 9:22 AM Bary Leriche, PA-C Physician Assistant Pend   View Details Report       Routing History     Date/Time From To Method   02/19/2015 9:48 AM Charlett Blake,  MD Charlett Blake, MD In Basket   02/19/2015 9:48 AM Charlett Blake, MD Loraine Leriche, MD Fax

## 2015-02-22 NOTE — Progress Notes (Signed)
Report given to Debra from 4W. Patient will be transferred to 4W25. All belongings sent with patient. Family will accompany. No other distress noted at this time.    Ave Filter, RN

## 2015-02-22 NOTE — Clinical Social Work Note (Signed)
Clinical Social Worker continues to follow patient and pt's family for disposition needs. Patient was accepted for CIR/IP REHAB and insurance authorization has been started. Per CIR Coordinator, bed available for today if insurance approves.   CSW has completed FL-2 and faxed clinicals to [place] at [facility] in the event insurance does not approve CIR. CSW has strongly communicated to [facility]'s admission director that patient will likely be admitted into IP REHAB today if insurance is approved and CSW will contact facility of any changes as needed.  FL-2 on chart for MD signature if patient needs SNF. CSW remains available as needed.   Glendon Axe, MSW, LCSWA [phone] 02/22/2015 11:02 AM

## 2015-02-22 NOTE — Progress Notes (Signed)
Katherine Cooke   DOB:[birth year]   WG#:[id number]   VXB#:[id number]  Patient Care Team: Loraine Leriche, MD as PCP - General (Internal Medicine)  Subjective: Patient seen and examined. No new issues since last seen on 9/16.  Denies fevers, chills, night sweats, vision changes, or mucositis. Denies any respiratory complaints. Denies any chest pain or palpitations. Denies lower extremity swelling. Denies nauseaAbdominal pain controlled.  Denies any dysuria. Denies abnormal skin rashes, or neuropathy. Denies any bleeding issues such as epistaxis, hematemesis, hematuria or hematochezia.  Scheduled Meds: . aspirin  325 mg Oral Daily  . cycloSPORINE  1 drop Both Eyes BID  . docusate sodium  200 mg Oral QHS  . enoxaparin (LOVENOX) injection  1 mg/kg Subcutaneous Q24H  . feeding supplement (ENSURE ENLIVE)  237 mL Oral BID BM  . montelukast  10 mg Oral QHS  . polyethylene glycol  17 g Oral Daily  . potassium chloride  40 mEq Oral Daily  . saccharomyces boulardii  250 mg Oral BID   Continuous Infusions: . sodium chloride 75 mL/hr at 02/17/15 1856   PRN Meds:.acetaminophen, diphenhydrAMINE, morphine, oxyCODONE, polyvinyl alcohol  Objective:  Filed Vitals:   02/22/15 0523  BP: 131/58  Pulse: 111  Temp: 98.8 F (37.1 C)  Resp: 18     No intake or output data in the 24 hours ending 02/22/15 1040   GENERAL:alert, no distress and comfortable, frail appearing.  SKIN: skin color, texture, turgor are normal, no rashes or significant lesions EYES: normal, conjunctiva are pink and non-injected, sclera clear OROPHARYNX:no exudate, no erythema and lips, buccal mucosa, and tongue normal  NECK: supple, thyroid normal size, non-tender, without nodularity LYMPH:  no palpable lymphadenopathy in the cervical, axillary or inguinal LUNGS: clear to auscultation and percussion with normal breathing effort HEART: regular rate & rhythm and no murmurs and no lower extremity edema ABDOMEN: soft, non-tender  and normal bowel sounds Musculoskeletal:no cyanosis of digits and no clubbing  PSYCH: alert & oriented x 3 with mildly slurred speech NEURO: facial droop improved.  Confusion improved. Strength normal.    CBG (last 3)  No results for input(s): GLUCAP in the last 72 hours.   Labs:   Recent Labs Lab 02/17/15 0413 02/18/15 0718 02/19/15 0608 02/20/15 0944 02/22/15 0534  WBC 12.2* 11.7* 12.7* 12.6* 14.6*  HGB 9.4* 10.1* 11.3* 10.6* 10.5*  HCT 28.2* 30.6* 33.1* 30.9* 31.6*  PLT 404* 401* 432* 405* 506*  MCV 98.3 96.8 97.1 97.2 97.5  MCH 32.8 32.0 33.1 33.3 32.4  MCHC 33.3 33.0 34.1 34.3 33.2  RDW 13.0 13.0 12.9 13.0 13.1  LYMPHSABS  --   --   --  1.9  --   MONOABS  --   --   --  0.8  --   EOSABS  --   --   --  0.4  --   BASOSABS  --   --   --  0.1  --      Chemistries:    Recent Labs Lab 02/16/15 0421 02/17/15 0413 02/18/15 0718 02/20/15 0617 02/20/15 0944 02/22/15 0534  NA 133* 136 133*  --  135 134*  K 3.7 3.5 3.3*  --  4.1 4.5  CL 103 106 102  --  102 101  CO2 '24 23 24  '$ --  24 24  GLUCOSE 97 96 118*  --  96 92  BUN 7 <5* <5*  --  <5* 7  CREATININE 0.72 0.60 0.69 0.63 0.61 0.68  CALCIUM 8.0*  8.0* 8.2*  --  8.4* 9.0  AST  --   --   --   --  39 70*  ALT  --   --   --   --  12* 33  ALKPHOS  --   --   --   --  65 77  BILITOT  --   --   --   --  0.3 0.3    GFR Estimated Creatinine Clearance: 33.8 mL/min (by C-G formula based on Cr of 0.68).  Liver Function Tests:  Recent Labs Lab 02/20/15 0944 02/22/15 0534  AST 39 70*  ALT 12* 33  ALKPHOS 65 77  BILITOT 0.3 0.3  PROT 5.7* 5.9*  ALBUMIN 2.2* 2.5*    Urine Studies     Component Value Date/Time   COLORURINE YELLOW 02/13/2015 1648   APPEARANCEUR CLEAR 02/13/2015 1648   LABSPEC 1.016 02/13/2015 1648   PHURINE 7.0 02/13/2015 1648   GLUCOSEU NEGATIVE 02/13/2015 1648   HGBUR NEGATIVE 02/13/2015 1648   BILIRUBINUR NEGATIVE 02/13/2015 1648   BILIRUBINUR neg 01/13/2015 1321   KETONESUR NEGATIVE  02/13/2015 1648   PROTEINUR NEGATIVE 02/13/2015 1648   PROTEINUR neg 01/13/2015 1321   UROBILINOGEN 0.2 02/13/2015 1648   UROBILINOGEN negative 01/13/2015 1321   NITRITE NEGATIVE 02/13/2015 1648   NITRITE neg 01/13/2015 1321   LEUKOCYTESUR SMALL* 02/13/2015 1648     Imaging Studies:  CTA head  1. Acute/subacute infarcts of the left thalamus and right cerebellum. 2. Moderate stenoses at the origins of the vertebral arteries bilaterally, worse on the right. 3. Tortuosity of the vertebral arteries without other focal stenoses. 4. The posterior cerebral arteries are of fetal type with a small P1 segments. 5. Marked tortuosity of the common and internal carotid arteries bilaterally without significant stenoses. 6. Dense atherosclerotic calcifications at the carotid bifurcations bilaterally without significant stenosis. 7. Multilevel spondylosis of the cervical spine.  Assessment/Plan: 81 y.o.   Colonic mass CT scan showed that she had a colonic mass with hepatic lesions.  She then underwent a sigmoidoscopy. Biopsies were taken. The pathologist, cannot tell us if this was a primary colon cancer.  this revealed poorly differentiated Ca -transiently on ciprofloxacin and Flagyl given concerns for necrosis and infection- general surgery discontinued Abx on 9/14 Her CA 19-9 was elevated at 54.  Her CEA-125 was elevated at 314 suggestive of ovarian primary  CEA was normal at  1.8.  Prealbumin is low She was supposed to have a resection because of the near obstructing nature of her cancer.  However, she then developed a embolic infarct of the brain She was transferred to rehab, and she is to be discharged soon to SNF prior to any surgical intervention, at least postponed for 2 weeks She may not be a candidate for aggressive treatment in the setting of CVA,and her overall clinical status improvement after SNF Will follow as outpatient if she is discharged.   Acute CVA In the setting of  malignancy On ASA and Lovenox  Anemia in neoplastic disease In the setting of Iron deficiency and malnutrition No transfusion is indicated at this time Monitor counts closely May need Iron  Transfuse blood to maintain a Hb of 8 g or if the patient is acutely bleeding  Malnutrition Due to malignancy and poor oral intake Will need nutrition evaluation as outpatient to help increase PAB and nutrients  Full Code   Other medical issues including hyponatremia, acute kidney injury, HTN as per admitting team     [facility]  E, PA-C 02/22/2015  10:40 AM  Hematology/Oncology Attending: The patient is seen and examined today. I agree with the above note. 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 CT scan of the chest performed recently showed no evidence of disease in the chest. Her CA-125 is elevated concerning for possibility of ovarian cancer. The patient was also recently diagnosed with a stroke and recovering well. She is expected to be transferred to inpatient rehabilitation later today. I had a lengthy discussion with the patient and her family about her current disease status and treatment options.  I still think the patient will benefit from surgical exploration and possible resection if needed. This can be done by Gen. surgery but evaluation by gynecologic surgery may also be beneficial for an opinion before resection. I will arrange for the patient follow-up appointment with me at the [facility] in 2 weeks for reevaluation after her surgical resection. The patient will continue with her current supportive care. Thank you for taking good care of Mrs. Cothron, I will continue to follow up the patient with you and assist in her management an as-needed basis.  Disclaimer: This note was dictated  with voice recognition software. Similar sounding words can inadvertently be transcribed and may be missed upon review.

## 2015-02-22 NOTE — Care Management Note (Signed)
Case Management Note  Patient Details  Name: Katherine Cooke MRN: [id number] Date of Birth: 03-Oct-1925  Subjective/Objective:                    Action/Plan: CM spoke with [facility], who states they are not able to extend a bed offer at this time.  This information was shared with [facility] liaison as well as attending MD.  Patient for probable discharge today. [facility] has opened the case and is awaiting word from patient's insurance. CM met with patient and family to update on discharge disposition.  They are aware that there is no bed offer from [facility] and that the discharge plan remains either [facility] or [facility] pending insurance and bed availability.  CSW aware of potential need for SNF placement at [facility].  CM will continue to follow.  Expected Discharge Date:  02/18/15               Expected Discharge Plan:  IP Rehab Facility  In-House Referral:  Clinical Social Work  Discharge planning Services  CM Consult  Post Acute Care Choice:    Choice offered to:  Patient, Adult Children  DME Arranged:    DME Agency:     HH Arranged:    [place] Agency:     Status of Service:  Completed, signed off  Medicare Important Message Given:  Yes-fourth notification given Date Medicare IM Given:    Medicare IM give by:    Date Additional Medicare IM Given:    Additional Medicare Important Message give by:     If discussed at [place] of Stay Meetings, dates discussed:    Additional Comments:  Rolm Baptise, RN 02/22/2015, 10:16 AM

## 2015-02-22 NOTE — Progress Notes (Signed)
PMR Admission Coordinator Pre-Admission Assessment  Patient: Katherine Cooke is an 81 y.o., female MRN: [id number] DOB: [birth year] Height: '4\' 11"'$  (149.9 cm) Weight: 46.72 kg (103 lb)  Insurance Information HMO: yes PPO: PCP: IPA: 80/20: OTHER: medicare replacement policy PRIMARY: A[employer] Medicare Policy#: [id number] Subscriber: pt CM Name: Jenny Reichmann Phone#: [phone] Fax#: [facility] online Pre-Cert#: [id number] Employer: retired approved for 7 days f/u with [facility] [employer] phone [phone] fax EMR access Benefits: Phone #: [phone] Name: 02/22/15 Eff. Date: 11/04/14 Deduct: none Out of Pocket Max: $4500 Life Max: none CIR: $345 per day days 1-5 then covers 100% SNF: no copay days 1-10; $160 copay per day days 21-49; no copay days 50-100 Outpatient: $40 copay per visit Co-Pay: no visit limit Home Health: 100% Co-Pay: no visit limit DME: 80% Co-Pay: 20% Providers: in network  SECONDARY: none   Medicaid Application Date: Case Manager:  Disability Application Date: Case Worker:   Emergency Facilities manager Information    Name Relation Home Work [place] Son [phone]  [place]  [phone]       Current Medical History  Patient Admitting Diagnosis: bilateral infarcts  History of Present Illness: Katherine Cooke is a 81 y.o. female with history of HTN, glaucoma, macular degeneration, BCC, abdominal mass X years who was admitted on 02/13/15 with progressive abdominal pain X 1 week and constipation. She was found to have extensive left colonic diverticulosis with complex mas 7.6 X 7.6 x 6.3 cm , 1.4 cm liver lesion and small pelvic and retroperitoneal nodes. She was started on clear  liquids/antibiotics due to leucocytosis and concerns of diverticulitis and sigmoidoscopy with biopsy positive for poorly differentiated adenocarcinoma which was likely arising from outside the colon. Pathology with possibility of lung or thyroid primary. She has been evaluated by [facility] and Dr. Earlie Server for input. Patient to be presented at GI conference for input and to continue on liquid diet as patient not fully obstructed. On 09/13, patient was noted to have confusion with difficulty talking. MRI brain done revealing multifocal areas of acute infarct affecting cerebellum, left paramedian pons, left thalamus, left posterior parietal and occipital regions question embolic from posterior circulation.Neurology consulted and recommended full work up with CTA brain/neck done revealing acute/subacute infarcts left thalamus and right cerebellum and moderate stenosis at origins of B-VA worse on right. 2D echo with EF 50-55% with moderate mitral calcification and grade 1 diastolic dysfunction.   Therapeutic dose of Lovenox added due to hypercoagulable state and neurology recommends holding off on urgent surgical procedure for 2 weeks and elective procedure for 6 months. Patient placed on ASA 627 mg for embolic stroke likely due to hypercoagulopathy from advanced malignancy and to avoid hypotension. She continues on clear liquids and surgery has signed off. CT chest positive for COPD, negative for malignancy and incidental note of moderate CM with severe LAD atherosclerosis. Dr. Marin Olp consulted for input and questioned ovarian primary and recommends surgery for resection of tumor to avoid complete obstruction. Patient is not a candidate for Chemo or XRT. Continues to have leucocytosis with question subclinical aspiration but afebrile and being monitored. CIR recommended for follow up therapy. Patient with resultant impaired balance, visual deficits, decreased activity tolerance, expressive aphasia requiring min to  moderate assistance to respond to open ended questions.   Total: 5 NIH    Past Medical History  Past Medical History  Diagnosis Date  . Hypertension   . Spinal stenosis   . Basal cell carcinoma of nose 2005  .  Squamous cell carcinoma of left hip 2005  . Colon cancer dx'd 02/16/15  . Diabetes mellitus without complication   . Glaucoma   . Macular degeneration    Family History  family history includes Breast cancer in her maternal aunt; COPD in her mother; Heart disease in her father; Ovarian cancer in her sister; Ulcers in her father.  Prior Rehab/Hospitalizations:  Has the patient had major surgery during 100 days prior to admission? No  Current Medications   Current facility-administered medications:  . 0.9 % sodium chloride infusion, , Intravenous, Continuous, Debbe Odea, MD, Last Rate: 75 mL/hr at 02/17/15 1856 . acetaminophen (TYLENOL) tablet 650 mg, 650 mg, Oral, Q6H PRN, Debbe Odea, MD, 650 mg at 02/20/15 1135 . aspirin tablet 325 mg, 325 mg, Oral, Daily, Kelvin Cellar, MD, 325 mg at 02/22/15 1020 . cycloSPORINE (RESTASIS) 0.05 % ophthalmic emulsion 1 drop, 1 drop, Both Eyes, BID, Reubin Milan, MD, 1 drop at 02/22/15 1021 . diphenhydrAMINE (BENADRYL) 12.5 MG/5ML elixir 12.5 mg, 12.5 mg, Oral, Q8H PRN, Gardiner Barefoot, NP . docusate sodium (COLACE) capsule 200 mg, 200 mg, Oral, QHS, Debbe Odea, MD, 200 mg at 02/21/15 2146 . enoxaparin (LOVENOX) injection 45 mg, 1 mg/kg, Subcutaneous, Q24H, Cecilio Asper Batchelder, RPH, 45 mg at 02/21/15 1512 . feeding supplement (ENSURE ENLIVE) (ENSURE ENLIVE) liquid 237 mL, 237 mL, Oral, BID BM, Reanne J Barbato, RD, 237 mL at 02/22/15 1020 . montelukast (SINGULAIR) tablet 10 mg, 10 mg, Oral, QHS, Reubin Milan, MD, 10 mg at 02/21/15 2146 . morphine 4 MG/ML injection 1 mg, 1 mg, Intravenous, Q2H PRN, Reubin Milan, MD, 1 mg at 02/22/15 0839 . oxyCODONE (Oxy IR/ROXICODONE)  immediate release tablet 5 mg, 5 mg, Oral, Q6H PRN, Debbe Odea, MD, 5 mg at 02/21/15 1814 . polyethylene glycol (MIRALAX / GLYCOLAX) packet 17 g, 17 g, Oral, Daily, Nita Sells, MD, 17 g at 02/22/15 1021 . polyvinyl alcohol (LIQUIFILM TEARS) 1.4 % ophthalmic solution 1 drop, 1 drop, Both Eyes, PRN, Ritta Slot, NP, 1 drop at 02/20/15 2230 . potassium chloride SA (K-DUR,KLOR-CON) CR tablet 40 mEq, 40 mEq, Oral, Daily, Nita Sells, MD, 40 mEq at 02/22/15 1022 . saccharomyces boulardii (FLORASTOR) capsule 250 mg, 250 mg, Oral, BID, Ronald Lobo, MD, 250 mg at 02/22/15 1020  Patients Current Diet: DIET SOFT Room service appropriate?: Yes; Fluid consistency:: Thin  Precautions / Restrictions Precautions Precautions: Fall Precaution Comments: R vision loss, maintains R eye closed Restrictions Weight Bearing Restrictions: No   Has the patient had 2 or more falls or a fall with injury in the past year?No  Prior Activity Level Community (5-7x/wk): very active and driving pta Pt very active going out daily and driving to visit children and friends, to movies, Production designer, theatre/television/film, etc.  Development worker, international aid / [place] Devices/Equipment: Grab bars in shower, Shower chair without back Home Equipment: Grab bars - tub/shower, Grab bars - toilet  Prior Device Use: Indicate devices/aids used by the patient prior to current illness, exacerbation or injury? None of the above  Prior Functional Level Prior Function Level of Independence: Independent Comments: Pt living on her own, ambulating to/from dining hall, driving.  Self Care: Did the patient need help bathing, dressing, using the toilet or eating? Independent  Indoor Mobility: Did the patient need assistance with walking from room to room (with or without device)? Independent  Stairs: Did the patient need assistance with internal or external stairs (with or without device)? Independent  Functional  Cognition:  Did the patient need help planning regular tasks such as shopping or remembering to take medications? Independent  Current Functional Level Cognition  Overall Cognitive Status: Within Functional Limits for tasks assessed Difficult to assess due to: Impaired communication Current Attention Level: Selective Orientation Level: Oriented to person, Oriented to place, Disoriented to time, Disoriented to situation Following Commands: Follows multi-step commands inconsistently General Comments: pt does remain slightly impulsive trying to get up without assist Attention: Sustained Sustained Attention: Appears intact Awareness: Appears intact   Extremity Assessment (includes Sensation/Coordination)  Upper Extremity Assessment: Generalized weakness (did not note particularly weakness on one side)  Lower Extremity Assessment: Defer to PT evaluation RLE Deficits / Details: R hip flex 3+/5, knee ext 4+/5, knee flex 4/5, ankle DF 1/5, PF 1/5 RLE Sensation: decreased proprioception (based on function) RLE Coordination: decreased gross motor    ADLs  Overall ADL's : Needs assistance/impaired Eating/Feeding: Set up, Sitting Eating/Feeding Details (indicate cue type and reason): pt on liquid diet, poor appetite Grooming: Wash/dry hands, Wash/dry face, Sitting, Set up Upper Body Bathing: Minimal assitance, Sitting Lower Body Bathing: Minimal assistance, Sit to/from stand Upper Body Dressing : Minimal assistance, Sitting Lower Body Dressing: Minimal assistance, Sit to/from stand Lower Body Dressing Details (indicate cue type and reason): able to don and doff socks Toilet Transfer: Minimal assistance, Stand-pivot Toileting- Clothing Manipulation and Hygiene: Minimal assistance, Sitting/lateral lean, Sit to/from stand, Moderate assistance    Mobility  Overal bed mobility: Needs Assistance Bed Mobility: Supine to Sit Supine to sit: Min assist General bed mobility comments: pt up  in chair    Transfers  Overall transfer level: Needs assistance Equipment used: 1 person hand held assist Transfers: Sit to/from Stand Sit to Stand: Min assist Stand pivot transfers: Min assist Squat pivot transfers: Min assist, Mod assist General transfer comment: max v/c's for safe hand placement, v/c's/tactile cues for anterior weight shift, pt posted legs up against chair    Ambulation / Gait / Stairs / Wheelchair Mobility  Ambulation/Gait Ambulation/Gait assistance: Museum/gallery curator (Feet): 150 Feet Assistive device: Rolling walker (2 wheeled) Gait Pattern/deviations: Step-through pattern General Gait Details: slow, guarded, shuffled, extremely narrow base of support, easily off balance however pt with vision deficit as well and maintaining R eye closed. pt with improved stabilty with RW however con't to minA for walker management when turning and around obstacles Gait velocity: slow and deliberate Gait velocity interpretation: <1.8 ft/sec, indicative of risk for recurrent falls    Posture / Balance Dynamic Sitting Balance Sitting balance - Comments: no LOB with LB dressing in sitting Balance Overall balance assessment: Needs assistance Sitting-balance support: No upper extremity supported Sitting balance-Leahy Scale: Fair Sitting balance - Comments: no LOB with LB dressing in sitting Postural control: Right lateral lean Standing balance support: Single extremity supported Standing balance-Leahy Scale: Poor Standing balance comment: UE support needed for balance Standardized Balance Assessment Standardized Balance Assessment : Dynamic Gait Index Dynamic Gait Index Level Surface: Moderate Impairment Change in Gait Speed: Moderate Impairment Gait with Horizontal Head Turns: Moderate Impairment Gait with Vertical Head Turns: Moderate Impairment Gait and Pivot Turn: Moderate Impairment Step Over Obstacle: Moderate Impairment Step Around Obstacles:  Moderate Impairment Steps: Moderate Impairment Total Score: 8    Special needs/care consideration Bowel mgmt: LBM 9/18 continetn Bladder mgmt: continent New dx colon ca 02/16/15. Surgery postponed due to CVA. Oncology is Dr. Marin Olp Dr. Zenaida Niece is surgeon   Previous Home Environment Living Arrangements: Alone Lives With: Alone Available Help at Discharge:  Other (Comment) ([facility] for 7 yrs; can go to ALF or SNF at [facility] ) Type of Home: Independent living facility Home Layout: One level Home Access: Level entry Bathroom Shower/Tub: Multimedia programmer: Handicapped height Bathroom Accessibility: Yes How Accessible: Accessible via walker [place]: No Additional Comments: Lived [facility] for 7 yrs. 5 yrs since her spouse died  Discharge Living Setting Plans for Discharge Living Setting: Other (Comment) (Likely will need ALF at [facility] at d/c. Pt is aware and re) Type of Home at Discharge: [place] Name at Discharge: [facility] facility Discharge Home Layout: One level Discharge Home Access: Level entry Discharge Bathroom Shower/Tub: Walk-in shower Discharge Bathroom Toilet: Handicapped height Discharge Bathroom Accessibility: Yes How Accessible: Accessible via walker Does the patient have any problems obtaining your medications?: No [name] spouse had admission at [facility] 6 yrs ago. He is now deceased.  Social/Family/Support Systems Patient Roles: Parent (three children. Son and dtr local and one son in [place]) [place] Information: Lanice Schwab, Local son Anticipated Caregiver: likely will need ALF at d/c Anticipated Caregiver's Contact Information: see above Ability/Limitations of Caregiver: son and dtr local and very supprotive and involved daily  Caregiver Availability: Other (Comment) (24/7 supervision at ALF) Discharge Plan Discussed with Primary Caregiver: Yes Is Caregiver In  Agreement with Plan?: Yes Does Caregiver/Family have Issues with Lodging/Transportation while Pt is in Rehab?: No  Goals/Additional Needs Patient/Family Goal for Rehab: supervision with PT, OT, and SLP Expected length of stay: [facility] 12-16 days Special Service Needs: Colon surgery postponed due to CVA while preparing for colon surgery. Likely d/c home prior to surgery Pt/Family Agrees to Admission and willing to participate: Yes Program Orientation Provided & Reviewed with Pt/Caregiver Including Roles & Responsibilities: Yes  Decrease burden of Care through IP rehab admission: n/a  Possible need for SNF placement upon discharge:pt from [place] at [facility]. She is aware that ALF there at [facility] likely necessary at d.c/  Patient Condition: This patient's medical and functional status has changed since the consult dated: 02/19/15 in which the Rehabilitation Physician determined and documented that the patient's condition is appropriate for intensive rehabilitative care in an inpatient rehabilitation facility. See "History of Present Illness" (above) for medical update. Functional changes are: overall min assist. Patient's medical and functional status update has been discussed with the Rehabilitation physician and patient remains appropriate for inpatient rehabilitation. Will admit to inpatient rehab today.  Preadmission Screen Completed By: Cleatrice Burke, 02/22/2015 12:52 PM ______________________________________________________________________  Discussed status with Dr. Naaman Plummer on 02/22/15 at 1252 and received telephone approval for admission today.  Admission Coordinator: Cleatrice Burke, time 2725 Date 02/22/2015          Cosigned by: Meredith Staggers, MD at 02/22/2015 1:15 PM  Revision History     Date/Time User Provider Type Action   02/22/2015 1:15 PM Meredith Staggers, MD Physician Cosign   02/22/2015 12:52 PM Cristina Gong, RN Rehab Admission Coordinator Sign

## 2015-02-23 ENCOUNTER — Inpatient Hospital Stay (HOSPITAL_COMMUNITY): Payer: Medicare Other | Admitting: Speech Pathology

## 2015-02-23 ENCOUNTER — Inpatient Hospital Stay (HOSPITAL_COMMUNITY): Payer: Medicare Other | Admitting: Physical Therapy

## 2015-02-23 ENCOUNTER — Inpatient Hospital Stay (HOSPITAL_COMMUNITY): Payer: Medicare Other

## 2015-02-23 LAB — CBC WITH DIFFERENTIAL/PLATELET
BASOS ABS: 0.1 10*3/uL (ref 0.0–0.1)
Basophils Relative: 1 %
EOS ABS: 0.4 10*3/uL (ref 0.0–0.7)
EOS PCT: 3 %
HCT: 30.6 % — ABNORMAL LOW (ref 36.0–46.0)
Hemoglobin: 10.2 g/dL — ABNORMAL LOW (ref 12.0–15.0)
LYMPHS PCT: 18 %
Lymphs Abs: 2.3 10*3/uL (ref 0.7–4.0)
MCH: 32.3 pg (ref 26.0–34.0)
MCHC: 33.3 g/dL (ref 30.0–36.0)
MCV: 96.8 fL (ref 78.0–100.0)
Monocytes Absolute: 0.8 10*3/uL (ref 0.1–1.0)
Monocytes Relative: 6 %
Neutro Abs: 8.9 10*3/uL — ABNORMAL HIGH (ref 1.7–7.7)
Neutrophils Relative %: 72 %
PLATELETS: 549 10*3/uL — AB (ref 150–400)
RBC: 3.16 MIL/uL — AB (ref 3.87–5.11)
RDW: 13.4 % (ref 11.5–15.5)
WBC: 12.4 10*3/uL — AB (ref 4.0–10.5)

## 2015-02-23 LAB — HEMOGLOBIN A1C
Hgb A1c MFr Bld: 5.7 % — ABNORMAL HIGH (ref 4.8–5.6)
Mean Plasma Glucose: 117 mg/dL

## 2015-02-23 LAB — COMPREHENSIVE METABOLIC PANEL
ALT: 27 U/L (ref 14–54)
AST: 41 U/L (ref 15–41)
Albumin: 2.4 g/dL — ABNORMAL LOW (ref 3.5–5.0)
Alkaline Phosphatase: 76 U/L (ref 38–126)
Anion gap: 9 (ref 5–15)
BUN: 14 mg/dL (ref 6–20)
CHLORIDE: 99 mmol/L — AB (ref 101–111)
CO2: 27 mmol/L (ref 22–32)
CREATININE: 0.7 mg/dL (ref 0.44–1.00)
Calcium: 9.2 mg/dL (ref 8.9–10.3)
GFR calc Af Amer: >60 mL/min (ref >60)
GFR calc non Af Amer: >60 mL/min (ref >60)
GLUCOSE: 99 mg/dL (ref 65–99)
Potassium: 4.9 mmol/L (ref 3.5–5.1)
SODIUM: 135 mmol/L (ref 135–145)
Total Bilirubin: 0.3 mg/dL (ref 0.3–1.2)
Total Protein: 5.9 g/dL — ABNORMAL LOW (ref 6.5–8.1)

## 2015-02-23 LAB — URINE CULTURE: SPECIAL REQUESTS: NORMAL

## 2015-02-23 MED ORDER — FLUTICASONE PROPIONATE 50 MCG/ACT NA SUSP
1.0000 | Freq: Every day | NASAL | Status: DC
  Administered 2015-02-23 – 2015-03-02 (×8): 1 via NASAL
  Filled 2015-02-23 (×2): qty 16

## 2015-02-23 NOTE — Progress Notes (Signed)
81 y.o. female with history of HTN, glaucoma, macular degeneration, BCC, abdominal mass X years who was admitted on 02/13/15 with progressive abdominal pain X 1 week and constipation. She was found to have extensive left colonic diverticulosis with complex mas 7.6 X 7.6 x 6.3 cm , 1.4 cm liver lesion and small pelvic and retroperitoneal nodes. She was started on clear liquids/antibiotics due to leucocytosis and concerns of diverticulitis and sigmoidoscopy with biopsy positive for poorly differentiated adenocarcinoma which was likely arising from outside the colon. Pathology with possibility of lung or thyroid primary. She has been evaluated by CCS and Dr. Earlie Server for input. Patient to be presented at GI conference for input and to continue on liquid diet as patient not fully obstructed. On 09/13, patient was noted to have confusion with difficulty talking. MRI brain done revealing multifocal areas of acute infarct affecting cerebellum, left paramedian pons, left thalamus, left posterior parietal and occipital regions question embolic from posterior circulation.Neurology consulted and recommended full work up with CTA brain/neck done revealing acute/subacute infarcts left thalamus and right cerebellum and moderate stenosis at origins of B-VA worse on right. 2D echo with EF 50-55% with moderate mitral calcification and grade 1 diastolic dysfunction  Subjective/Complaints: No issues overnite Discussed with OT, pt required little physical assist with ADLs ROS- unable to assess due to aphasia Objective: Vital Signs: Blood pressure 130/55, pulse 91, temperature 98.7 F (37.1 C), temperature source Oral, resp. rate 17, height _0  (1.575 m), SpO2 92 %. Dg Chest 2 View  02/22/2015   CLINICAL DATA:  Generalized weakness. Stroke several days ago. No chest pain or shortness of breath. History of hypertension.  EXAM: CHEST  2 VIEW  COMPARISON:  02/19/2015, 02/17/2015  FINDINGS: Heart is mildly enlarged. There is  perihilar peribronchial thickening. There are no focal consolidations or effusions. No pulmonary edema.  IMPRESSION: 1. Mild cardiomegaly.  No pulmonary edema. 2. Bronchitic changes.  No focal acute pulmonary abnormality.   Electronically Signed   By: Nolon Nations M.D.   On: 02/22/2015 16:10   Dg Abd 1 View  02/22/2015   CLINICAL DATA:  Generalized weakness, stroke several days ago, no abdominal pain.  EXAM: ABDOMEN - 1 VIEW  COMPARISON:  Abdominal plain film dated 10/13/2014 and abdominal CT dated 02/13/2015.  FINDINGS: Bowel gas pattern is nonobstructive. No evidence of soft tissue mass or abnormal fluid collection seen, although mass versus abscess was described in the region of the sigmoid colon on earlier CT. No free intraperitoneal air seen.  Degenerative changes noted within the scoliotic thoracolumbar spine. No acute - appearing osseous abnormality. Scarring versus atelectasis noted at each lung base, partially obscured on the right by an elevated hemidiaphragm.  IMPRESSION: Nonobstructive bowel gas pattern and no evidence of acute intra-abdominal or intrapelvic abnormality.  Please note that earlier CT of 02/13/2015 showed a colonic malignancy versus abscess in the left pelvis.   Electronically Signed   By: Franki Cabot M.D.   On: 02/22/2015 16:13   Results for orders placed or performed during the hospital encounter of 02/22/15 (from the past 72 hour(s))  Culture, Urine     Status: None (Preliminary result)   Collection Time: 02/22/15  6:47 PM  Result Value Ref Range   Specimen Description URINE, CLEAN CATCH    Special Requests Normal    Culture TOO YOUNG TO READ    Report Status PENDING   Urinalysis, Routine w reflex microscopic (not at [facility])     Status: None   Collection  Time: 02/22/15  6:47 PM  Result Value Ref Range   Color, Urine YELLOW YELLOW   APPearance CLEAR CLEAR   Specific Gravity, Urine 1.018 1.005 - 1.030   pH 7.0 5.0 - 8.0   Glucose, UA NEGATIVE NEGATIVE mg/dL   Hgb  urine dipstick NEGATIVE NEGATIVE   Bilirubin Urine NEGATIVE NEGATIVE   Ketones, ur NEGATIVE NEGATIVE mg/dL   Protein, ur NEGATIVE NEGATIVE mg/dL   Urobilinogen, UA 0.2 0.0 - 1.0 mg/dL   Nitrite NEGATIVE NEGATIVE   Leukocytes, UA NEGATIVE NEGATIVE    Comment: MICROSCOPIC NOT DONE ON URINES WITH NEGATIVE PROTEIN, BLOOD, LEUKOCYTES, NITRITE, OR GLUCOSE <1000 mg/dL.  CBC WITH DIFFERENTIAL     Status: Abnormal   Collection Time: 02/23/15  4:04 AM  Result Value Ref Range   WBC 12.4 (H) 4.0 - 10.5 K/uL   RBC 3.16 (L) 3.87 - 5.11 MIL/uL   Hemoglobin 10.2 (L) 12.0 - 15.0 g/dL   HCT 30.6 (L) 36.0 - 46.0 %   MCV 96.8 78.0 - 100.0 fL   MCH 32.3 26.0 - 34.0 pg   MCHC 33.3 30.0 - 36.0 g/dL   RDW 13.4 11.5 - 15.5 %   Platelets 549 (H) 150 - 400 K/uL   Neutrophils Relative % 72 %   Neutro Abs 8.9 (H) 1.7 - 7.7 K/uL   Lymphocytes Relative 18 %   Lymphs Abs 2.3 0.7 - 4.0 K/uL   Monocytes Relative 6 %   Monocytes Absolute 0.8 0.1 - 1.0 K/uL   Eosinophils Relative 3 %   Eosinophils Absolute 0.4 0.0 - 0.7 K/uL   Basophils Relative 1 %   Basophils Absolute 0.1 0.0 - 0.1 K/uL  Comprehensive metabolic panel     Status: Abnormal   Collection Time: 02/23/15  4:04 AM  Result Value Ref Range   Sodium 135 135 - 145 mmol/L   Potassium 4.9 3.5 - 5.1 mmol/L   Chloride 99 (L) 101 - 111 mmol/L   CO2 27 22 - 32 mmol/L   Glucose, Bld 99 65 - 99 mg/dL   BUN 14 6 - 20 mg/dL   Creatinine, Ser 0.70 0.44 - 1.00 mg/dL   Calcium 9.2 8.9 - 10.3 mg/dL   Total Protein 5.9 (L) 6.5 - 8.1 g/dL   Albumin 2.4 (L) 3.5 - 5.0 g/dL   AST 41 15 - 41 U/L   ALT 27 14 - 54 U/L   Alkaline Phosphatase 76 38 - 126 U/L   Total Bilirubin 0.3 0.3 - 1.2 mg/dL   GFR calc non Af Amer >60 >60 mL/min   GFR calc Af Amer >60 >60 mL/min    Comment: (NOTE) The eGFR has been calculated using the CKD EPI equation. This calculation has not been validated in all clinical situations. eGFR's persistently <60 mL/min signify possible Chronic  Kidney Disease.    Anion gap 9 5 - 15     HEENT: left eye ptosis Cardio: RRR and no murmur Resp: CTA B/L and unlabored GI: BS positive and non tender Extremity:  Pulses positive and No Edema Skin:   Intact Neuro: Alert/Oriented, Cranial Nerve Abnormalities Left CN 3, Abnormal Sensory difficult to assess secondary to aphasia, Abnormal Motor 4/5 in BUE and BLE, Abnormal FMC Ataxic/ dec FMC, Dysarthric and Aphasic Musc/Skel:  Other no pain with UE or LE ROM Gen NAD Left hemiataxia  Assessment/Plan: 1. 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 Physiatrist is providing close team supervision and 24 hour management of active medical problems listed below. Physiatrist and rehab team continue to assess barriers to discharge/monitor patient progress toward functional and medical goals.  Good mobility, anticipate short LOS Will need to D/C to home or SNF  Then f/u with Gen Surgery FIM:       Function - Toileting Toileting steps completed by patient: Adjust clothing prior to toileting, Performs perineal hygiene, Adjust clothing after toileting Toileting Assistive Devices: Grab bar or rail  Function - Air cabin crew transfer assistive device: Grab bar Assist level to toilet: Touching or steadying assistance (Pt > 75%) Assist level from toilet: Touching or steadying assistance (Pt > 75%)        Function - Comprehension Comprehension: Auditory Comprehension assist level: Understands basic 25 - 49% of the time/ requires cueing 50 - 75% of the time  Function - Expression Expression: Verbal Expression assist level: Expresses basic 25 - 49% of the time/requires cueing 50 - 75% of the time. Uses single words/gestures.  Function - Social Interaction Social Interaction assist level: Interacts  appropriately 75 - 89% of the time - Needs redirection for appropriate language or to initiate interaction.  Function - Problem Solving Problem solving assist level: Solves basic 25 - 49% of the time - needs direction more than half the time to initiate, plan or complete simple activities  Function - Memory Memory assist level: Recognizes or recalls 25 - 49% of the time/requires cueing 50 - 75% of the time  Medical Problem List and Plan: 1. 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 DVT Prophylaxis/Anticoagulation: Pharmaceutical: Lovenox 3. Spinal stenosis/Pain Management: Hydrocodone constipating --continue Tylenol prn for pain. Avoid narcotics. Will add K pad.  4. Mood: LCSW to follow for evaluation and support.  5. Neuropsych: This patient is not capable of making decisions on her own behalf. 6. Skin/Wound Care: Routine pressure relief measures. Offer supplements between meals.  7. Fluids/Electrolytes/Nutrition: Monitor I/O. Continue supplements between meals.  8. HTN: Monitor BID. Avoid hypotension. Will d/c IVF and encourage po intake.  9. Colonic lesion with near obstruction: Continue daily Miralax with second dose prn if no BM that day to prevent obstruction. Diet has been advanced to soft but reported to have difficulty swallowing SLP eval today. Will check KUB.  10. Hypokalemia: Has resolved with supplement. Will d/c Kdur and monitor. 11. Leucocytosis: Has had increase in WBC to 14.6. Monitor of signs of infection. Negative 9/19 CXR 12. Thrombocytosis: Recheck in am.  13. Iron deficiency anemia: Avoiding po iron for now due to GI symptoms.  14. Code status: Discussed--Patient and family desire Full Code.    LOS (Days) 1 A FACE TO FACE EVALUATION WAS PERFORMED  KIRSTEINS,ANDREW E 02/23/2015, 8:42 AM

## 2015-02-23 NOTE — Evaluation (Signed)
Physical Therapy Assessment and Plan  Patient Details  Name: Katherine Cooke MRN: [id number] Date of Birth: 02-01-1926  PT Diagnosis: Ataxia, Cognitive deficits, Difficulty walking, Hemiparesis dominant, Muscle weakness and Pain in abdomen Rehab Potential: Good ELOS: 7-10 days   Today's Date: 02/23/2015 PT Individual Time: 9:07-10:07 (60 min)     Problem List:  Patient Active Problem List   Diagnosis Date Noted  . Embolic stroke involving cerebellar artery 02/22/2015  . Ataxia 02/22/2015  . Stroke with cerebral ischemia   . Malignancy   . CVA (cerebral infarction) 02/17/2015  . Anemia of chronic disease 02/15/2015  . Leukocytosis   . Colonic mass 02/13/2015  . Osteopenia 01/13/2015  . Hypertension   . Basal cell carcinoma of nose   . Squamous cell carcinoma of left hip     Past Medical History:  Past Medical History  Diagnosis Date  . Hypertension   . Spinal stenosis   . Basal cell carcinoma of nose 2005  . Squamous cell carcinoma of left hip 2005  . Colon cancer dx'd 02/16/15  . Diabetes mellitus without complication   . Glaucoma   . Macular degeneration    Past Surgical History:  Past Surgical History  Procedure Laterality Date  . Breast biopsy  1950's ; 1952 ; 1954    x 3 - Benign  . Tonsillectomy    . Flexible sigmoidoscopy N/A 02/15/2015    Procedure: FLEXIBLE SIGMOIDOSCOPY;  Surgeon: Ronald Lobo, MD;  Location: [facility];  Service: Endoscopy;  Laterality: N/A;    Assessment & Plan Clinical Impression: Patient is a 81 y.o. female with history of HTN, glaucoma, macular degeneration, BCC, abdominal mass X years who was admitted on 02/13/15 with progressive abdominal pain X 1 week and constipation. She was found to have extensive left colonic diverticulosis with complex mas 7.6 X 7.6 x 6.3 cm , 1.4 cm liver lesion and small pelvic and retroperitoneal nodes. She was started on clear liquids/antibiotics due to leucocytosis and concerns of diverticulitis and  sigmoidoscopy with biopsy positive for poorly differentiated adenocarcinoma which was likely arising from outside the colon. Pathology with possibility of lung or thyroid primary. She has been evaluated by CCS and Dr. Earlie Server for input. Patient to be presented at GI conference for input and to continue on liquid diet as patient not fully obstructed. On 09/13, patient was noted to have confusion with difficulty talking. MRI brain done revealing multifocal areas of acute infarct affecting cerebellum, left paramedian pons, left thalamus, left posterior parietal and occipital regions question embolic from posterior circulation.Neurology consulted and recommended full work up with CTA brain/neck done revealing acute/subacute infarcts left thalamus and right cerebellum and moderate stenosis at origins of B-VA worse on right. 2D echo with EF 50-55% with moderate mitral calcification and grade 1 diastolic dysfunction.   Therapeutic dose of Lovenox added due to hypercoagulable state and neurology recommends holding off on urgent surgical procedure for 2 weeks and elective procedure for 6 months. Patient placed on ASA 528 mg for embolic stroke likely due to hypercoagulopathy from advanced malignancy and to avoid hypotension. She continues on clear liquids and surgery has signed off. CT chest positive for COPD, negative for malignancy and incidental note of moderate CM with severe LAD atherosclerosis. Dr. Marin Olp consulted for input and questioned ovarian primary and recommends surgery for resection of tumor to avoid complete obstruction. Patient is not a candidate for Chemo or XRT. Continues to have leucocytosis with question subclinical aspiration but afebrile and being monitored.  Patient transferred to CIR on 02/22/2015 .   Patient currently requires min with mobility secondary to muscle weakness, impaired timing and sequencing and decreased coordination, decreased visual acuity, decreased memory and delayed  processing and decreased standing balance, decreased postural control, decreased balance strategies and hemiparesis.  Prior to hospitalization, patient was independent  with mobility and lived with Alone in a Independent living facility home.  Home access is  Level entry.  Patient will benefit from skilled PT intervention to maximize safe functional mobility and minimize fall risk for planned discharge home with intermittent assist.  Anticipate patient will benefit from follow up [facility] at discharge.  PT - End of Session Activity Tolerance: Improving Endurance Deficit: No PT Assessment Rehab Potential (ACUTE/IP ONLY): Good PT Patient demonstrates impairments in the following area(s): Balance;Motor;Pain;Other (comment) (vision) PT Transfers Functional Problem(s): Bed to Chair;Car;Furniture PT Locomotion Functional Problem(s): Ambulation;Stairs PT Plan PT Intensity: Minimum of 1-2 x/day ,45 to 90 minutes PT Frequency: 5 out of 7 days PT Duration Estimated Length of Stay: 7-10 days PT Treatment/Interventions: Ambulation/gait training;Balance/vestibular training;Cognitive remediation/compensation;Community reintegration;Discharge planning;Disease management/prevention;DME/adaptive equipment instruction;Functional mobility training;Neuromuscular re-education;Pain management;Patient/family education;Psychosocial support;Stair training;Therapeutic Activities;Therapeutic Exercise;UE/LE Strength taining/ROM;UE/LE Coordination activities;Visual/perceptual remediation/compensation PT Transfers Anticipated Outcome(s): Mod I PT Locomotion Anticipated Outcome(s): Mod I PT Recommendation Recommendations for Other Services: Neuropsych consult Follow Up Recommendations: Home health PT Patient destination: Assisted Living (vs. ILF) Equipment Recommended: None recommended by PT  Skilled Therapeutic Intervention Pt participated in skilled PT evaluation.  Reviewed car transfer passenger side with pt performing  stand pivot min A and able to balance on RLE to place LLE into car.  Also performed gait training in controlled environment with min A with focus on vertical and horizontal head turns to compensate for new visual deficits and cues for increased gait velocity.  Returned to room and pt assisted back to bed with min A; discussed LOS and goals with pt and son.  Pt verbalized agreement.  Pt left with all items within reach.  PT Evaluation Precautions/Restrictions Precautions Precautions: Fall Precaution Comments: Visual deficits; glaucoma, macular degeneration, blurring of vision R eye, metastatic cancer, spinal stenosis Restrictions Weight Bearing Restrictions: No General Chart Reviewed: Yes Response to Previous Treatment: Patient with no complaints from previous session. Family/Caregiver Present: Yes (youngest son)  Pain Pain Assessment Pain Assessment: Faces Pain Score: 0-No pain Faces Pain Scale: Hurts even more Pain Type: Acute pain Pain Location: Abdomen Pain Orientation: Mid Pain Descriptors / Indicators: Sore;Squeezing Pain Onset: On-going Pain Intervention(s): Other (Comment) (lowered waist band on pants) Home Living/Prior Functioning Home Living Available Help at Discharge: Available PRN/intermittently;Personal care attendant (paid caregivers) Type of Home: Independent living facility Home Access: Level entry Home Layout: One level Additional Comments: Lived ILF at [facility] for 7 yrs. 5 yrs since her spouse died  Lives With: Alone Prior Function Level of Independence: Independent with gait;Independent with transfers;Independent with basic ADLs  Able to Take Stairs?: Yes Driving: Yes Vocation: Retired Comments: Pt living on her own, ambulating to/from [employer], driving. Vision/Perception   Blurring of vision R eye; h/o glaucoma and macular degeneration Cognition Orientation Level: Oriented to person;Oriented to place; oriented to time; oriented to  situation Sensation Sensation Light Touch: Appears Intact Stereognosis: Appears Intact Hot/Cold: Appears Intact Proprioception: Appears Intact Coordination Gross Motor Movements are Fluid and Coordinated: No Coordination and Movement Description: Slightly ataxic movements in RUE and RLE Motor  Motor Motor: Other (comment) (hemi paresis) Motor - Skilled Clinical Observations: R UE and LE weakness   Mobility Bed  Mobility Bed Mobility: Rolling Right;Rolling Left;Supine to Sit Rolling Right: With rail;5: Supervision Rolling Left: 5: Supervision Supine to Sit: 5: Supervision Transfers Sit to Stand: 4: Min assist;With upper extremity assist;From bed;With armrests Locomotion  Ambulation Ambulation Distance (Feet): 200 Feet Gait Gait: Yes Gait Pattern: Impaired Gait Pattern: Step-through pattern;Narrow base of support (LOB to R) Stairs / Additional Locomotion Stairs: Yes Stairs Assistance: 4: Min assist Stair Management Technique: Two rails;Alternating pattern;Forwards Number of Stairs: 4 Height of Stairs: 6 Wheelchair Mobility Wheelchair Mobility: No  Trunk/Postural Assessment  Cervical Assessment Cervical Assessment: Within Functional Limits Thoracic Assessment Thoracic Assessment: Within Functional Limits Lumbar Assessment Lumbar Assessment: Exceptions to [facility] (reports spinal stenosis with radiculopathy) Postural Control Postural Control: Deficits on evaluation (impaired balance reactions)  Balance Balance Balance Assessed: Yes Static Sitting Balance Static Sitting - Level of Assistance: 7: Independent Dynamic Sitting Balance Dynamic Sitting - Level of Assistance: 6: Modified independent (Device/Increase time) Static Standing Balance Static Standing - Balance Support: No upper extremity supported Static Standing - Level of Assistance: 4: Min assist Dynamic Standing Balance Dynamic Standing - Balance Support: No upper extremity supported Dynamic Standing - Level of  Assistance: 4: Min assist Extremity Assessment      RLE Assessment RLE Assessment: Exceptions to [facility] RLE Strength RLE Overall Strength: Deficits RLE Overall Strength Comments: 4/5; slightly ataxic LLE Assessment LLE Assessment: Within Functional Limits   See Function Navigator for Current Functional Status.   Refer to Care Plan for Long Term Goals  Recommendations for other services: Neuropsych  Discharge Criteria: Patient will be discharged from PT if patient refuses treatment 3 consecutive times without medical reason, if treatment goals not met, if there is a change in medical status, if patient makes no progress towards goals or if patient is discharged from hospital.  The above assessment, treatment plan, treatment alternatives and goals were discussed and mutually agreed upon: by patient and by family  Malachy Mood 02/23/2015, 10:27 PM

## 2015-02-23 NOTE — Evaluation (Signed)
Occupational Therapy Assessment and Plan  Patient Details  Name: Katherine Cooke MRN: [id number] Date of Birth: 1926/02/09  OT Diagnosis: altered mental status and muscle weakness (generalized) Rehab Potential: Rehab Potential (ACUTE ONLY): Excellent ELOS: 7-10 days   Today's Date: 02/23/2015 OT Individual Time: 7893-8101 OT Individual Time Calculation (min): 60 min     Problem List:  Patient Active Problem List   Diagnosis Date Noted  . Embolic stroke involving cerebellar artery 02/22/2015  . Ataxia 02/22/2015  . Stroke with cerebral ischemia   . Malignancy   . CVA (cerebral infarction) 02/17/2015  . Anemia of chronic disease 02/15/2015  . Leukocytosis   . Colonic mass 02/13/2015  . Osteopenia 01/13/2015  . Hypertension   . Basal cell carcinoma of nose   . Squamous cell carcinoma of left hip     Past Medical History:  Past Medical History  Diagnosis Date  . Hypertension   . Spinal stenosis   . Basal cell carcinoma of nose 2005  . Squamous cell carcinoma of left hip 2005  . Colon cancer dx'd 02/16/15  . Diabetes mellitus without complication   . Glaucoma   . Macular degeneration    Past Surgical History:  Past Surgical History  Procedure Laterality Date  . Breast biopsy  1950's ; 1952 ; 1954    x 3 - Benign  . Tonsillectomy    . Flexible sigmoidoscopy N/A 02/15/2015    Procedure: FLEXIBLE SIGMOIDOSCOPY;  Surgeon: Ronald Lobo, MD;  Location: [facility];  Service: Endoscopy;  Laterality: N/A;    Assessment & Plan Clinical Impression: Patient is a 81 y.o. year old female with history of  HTN, glaucoma, macular degeneration, BCC, abdominal mass X years who was admitted on 02/13/15 with progressive abdominal pain X 1 week and constipation. She was found to have extensive left colonic diverticulosis with complex mas 7.6 X 7.6 x 6.3 cm , 1.4 cm liver lesion and small pelvic and retroperitoneal nodes.  On 09/13, patient was noted to have confusion with difficulty talking  with MRI brain done revealing multifocal areas of acute infarct affecting cerebellum, left paramedian pons, left thalamus, left posterior parietal and occipital regions question embolic from posterior circulation.Neurology consulted and recommended full work up with CTA brain/neck done revealing acute/subacute infarcts left thalamus and right cerebellum and moderate stenosis at origins of B-VA worse on right. 2D echo with EF 50-55% with moderate mitral calcification and grade 1 diastolic dysfunction.   Patient placed on ASA 751 mg for embolic stroke likely due to hypercoagulopathy from advanced malignancy and to avoid hypotension. She continues on clear liquids and surgery has signed off. CT chest positive for COPD, negative for malignancy and incidental note of moderate CM with severe LAD atherosclerosis. Dr. Marin Olp consulted for input and questioned ovarian primary and recommends surgery for resection of tumor to avoid complete obstruction. Patient is not a candidate for Chemo or XRT.  Patient transferred to CIR on 02/22/2015.    Patient currently requires min with basic self-care skills secondary to muscle weakness and decreased awareness, decreased problem solving and decreased memory.  Prior to hospitalization, patient could complete BADL independently and relied on intermittent assist with community mobility and errands.  Patient will benefit from skilled intervention to increase independence with basic self-care skills prior to discharge home independently.  Anticipate patient will require intermittent supervision and follow up outpatient.  OT - End of Session Activity Tolerance: Tolerates 30+ min activity with multiple rests OT Assessment Rehab Potential (ACUTE ONLY): Excellent  OT Patient demonstrates impairments in the following area(s): Cognition;Safety;Endurance;Motor;Balance OT Basic ADL's Functional Problem(s): Bathing;Dressing;Toileting OT Transfers Functional Problem(s):  Toilet;Tub/Shower OT Additional Impairment(s): None OT Plan OT Intensity: Minimum of 1-2 x/day, 45 to 90 minutes OT Frequency: 5 out of 7 days OT Duration/Estimated Length of Stay: 7-10 days OT Treatment/Interventions: Medical illustrator training;Patient/family education;Functional mobility training;Therapeutic Activities;Therapeutic Exercise;Discharge planning;Cognitive remediation/compensation OT Self Feeding Anticipated Outcome(s): Mod I OT Basic Self-Care Anticipated Outcome(s): Mod I OT Toileting Anticipated Outcome(s): Mod I OT Bathroom Transfers Anticipated Outcome(s): Supervision OT Recommendation Patient destination: Assisted Living Follow Up Recommendations: None Equipment Recommended: To be determined   Skilled Therapeutic Intervention OT 1:1 evaluation completed with treatment provided to emphasize safe transfers, functional mobility, orientation, memory, methods/goals of treatment and adapted bathing/dressing skills using DME.   Pt demo'd memory impaired and word-finding deficits during eval possibly d/t fatigue but completed self-care, bathing and dressing with overall min assist and min vc.  OT Evaluation Precautions/Restrictions  Precautions Precautions: Fall Precaution Comments: R vision loss, maintains R eye closed Restrictions Weight Bearing Restrictions: No  General Chart Reviewed: Yes Family/Caregiver Present: No  Vital Signs Therapy Vitals Temp: 98.7 F (37.1 C) Temp Source: Oral Pulse Rate: 91 Resp: 17 BP: (!) 130/55 mmHg Patient Position (if appropriate): Lying Oxygen Therapy SpO2: 92 % O2 Device: Not Delivered  Pain No/denies pain  Home Living/Prior Functioning Home Living Available Help at Discharge: Other (Comment) Type of Home: Independent living facility Home Access: Level entry Home Layout: One level Bathroom Shower/Tub: Multimedia programmer: Handicapped height Bathroom Accessibility: Yes Additional Comments: Lived ILF  at [facility] for 7 yrs. 5 yrs since her spouse died  Lives With: Alone IADL History Homemaking Responsibilities: Yes Meal Prep Responsibility: No Laundry Responsibility: No Cleaning Responsibility: No Bill Paying/Finance Responsibility: Primary Shopping Responsibility: No Child Care Responsibility: No Current License: Yes Mode of Transportation: Car Education: HS + some college Leisure and Hobbies: irregular vistor to [employer] Prior Function Level of Independence: Independent with basic ADLs  Able to Take Stairs?: Yes Driving: Yes Vocation: Other (comment) Vocation Requirements: prior homemaker aftrer college Comments: Pt living on her own, ambulating to/from dining hall, driving.  ADL ADL ADL Comments: see Functional Assessment  Vision/Perception  Vision- History Baseline Vision/History: Macular Degeneration Patient Visual Report: No change from baseline (severly impaired at right eye, wears patch intermittently) Vision- Assessment Vision Assessment?: Yes Additional Comments: History of macular degeneration, severe visual impairment at right eye Perception Comments: R/L discriminiation impaired   Cognition Overall Cognitive Status: Impaired/Different from baseline Arousal/Alertness: Awake/alert Orientation Level: Person;Situation Person: Oriented Place: Disoriented Situation: Oriented Year: 2020 (incorrect for year, month and day) Day of Week: Incorrect Memory: Impaired Memory Impairment: Decreased short term memory Immediate Memory Recall: Sock;Blue;Bed Memory Recall: Sock (1 of 3 items recalled; 2 unable even with cues) Memory Recall Blue: Without Cue Attention: Sustained Focused Attention: Appears intact Sustained Attention: Appears intact Awareness: Appears intact Problem Solving: Impaired Problem Solving Impairment: Verbal complex;Functional complex Safety/Judgment: Appears intact  Sensation Sensation Light Touch: Appears Intact Stereognosis:  Appears Intact Hot/Cold: Appears Intact Proprioception: Appears Intact Coordination Gross Motor Movements are Fluid and Coordinated: Yes Fine Motor Movements are Fluid and Coordinated: Yes  Motor  Motor Motor: Within Functional Limits  Mobility  Bed Mobility Bed Mobility: Rolling Right;Sitting - Scoot to Edge of Bed Rolling Right: With rail;5: Supervision Rolling Right Details: Visual cues for safe use of DME/AD;Verbal cues for technique Sitting - Scoot to Edge of Bed: 5: Supervision Sitting - Scoot to [employer]  of Bed Details: Verbal cues for technique Transfers Transfers: Sit to Stand Sit to Stand: 4: Min guard;With upper extremity assist   Trunk/Postural Assessment  Cervical Assessment Cervical Assessment: Within Functional Limits Thoracic Assessment Thoracic Assessment: Within Functional Limits Lumbar Assessment Lumbar Assessment: Within Functional Limits Postural Control Postural Control: Within Functional Limits   Balance Dynamic Sitting Balance Sitting balance - Comments: no LOB with LB dressing in sitting   Extremity/Trunk Assessment RUE Assessment RUE Assessment: Within Functional Limits LUE Assessment LUE Assessment: Within Functional Limits   See Function Navigator for Current Functional Status.   Refer to Care Plan for Long Term Goals  Recommendations for other services: None  Discharge Criteria: Patient will be discharged from OT if patient refuses treatment 3 consecutive times without medical reason, if treatment goals not met, if there is a change in medical status, if patient makes no progress towards goals or if patient is discharged from hospital.  The above assessment, treatment plan, treatment alternatives and goals were discussed and mutually agreed upon: by patient   Second session: Time: 1300-1330 Time Calculation (min): 30 min  Pain Assessment: No/denies pain  Skilled Therapeutic Interventions: ADL-retraining,15 min, with focus on  attention, problem-solving and dynamic standing balance.   Pt brushed her teeth standing at the sink with 2 cues required to problem-solve: pt attempted to apply roll-on deoderant to toothbrush and then required instruction on removing toothpaste cap.  Therapeutic exercise with focus on general endurance.  Pt completed 252 steps using NuStep, level 1, seat/arm position #5.    Pt tolerated 5 minutes of therex and requested rest and return to her room.   Pt assisted to recliner and remained in her room with her son attending.   OT educated son on pt's progress through treatments this date.   See FIM for current functional status  Therapy/Group: Individual Therapy  [place] 02/23/2015, 2:52 PM

## 2015-02-23 NOTE — Evaluation (Signed)
Speech Language Pathology Assessment and Plan  Patient Details  Name: Katherine Cooke MRN: [id number] Date of Birth: [birth year]  SLP Diagnosis: Speech and Language deficits;Cognitive Impairments  Rehab Potential: Good ELOS: 7-10 days     Today's Date: 02/23/2015 SLP Individual Time: 1110-1205 SLP Individual Time Calculation (min): 55 min   Problem List:  Patient Active Problem List   Diagnosis Date Noted  . Embolic stroke involving cerebellar artery 02/22/2015  . Ataxia 02/22/2015  . Stroke with cerebral ischemia   . Malignancy   . CVA (cerebral infarction) 02/17/2015  . Anemia of chronic disease 02/15/2015  . Leukocytosis   . Colonic mass 02/13/2015  . Osteopenia 01/13/2015  . Hypertension   . Basal cell carcinoma of nose   . Squamous cell carcinoma of left hip    Past Medical History:  Past Medical History  Diagnosis Date  . Hypertension   . Spinal stenosis   . Basal cell carcinoma of nose 2005  . Squamous cell carcinoma of left hip 2005  . Colon cancer dx'd 02/16/15  . Diabetes mellitus without complication   . Glaucoma   . Macular degeneration    Past Surgical History:  Past Surgical History  Procedure Laterality Date  . Breast biopsy  1950's ; 1952 ; 1954    x 3 - Benign  . Tonsillectomy    . Flexible sigmoidoscopy N/A 02/15/2015    Procedure: FLEXIBLE SIGMOIDOSCOPY;  Surgeon: Ronald Lobo, MD;  Location: [facility];  Service: Endoscopy;  Laterality: N/A;    Assessment / Plan / Recommendation Clinical Impression   Katherine Cooke is a 81 y.o. female with history of HTN, glaucoma, macular degeneration, BCC, abdominal mass X years who was admitted on 02/13/15 with progressive abdominal pain X 1 week and constipation. She was found to have extensive left colonic diverticulosis with complex mass, liver lesion, and small pelvic and retroperitoneal nodes. She was started on clear liquids/antibiotics due to leucocytosis and concerns of diverticulitis and  sigmoidoscopy with biopsy positive for poorly differentiated adenocarcinoma which was likely arising from outside the colon. Pathology with possibility of lung or thyroid primary. On 09/13, patient was noted to have confusion with difficulty talking. MRI brain done revealing multifocal areas of acute infarct affecting cerebellum, left paramedian pons, left thalamus, left posterior parietal and occipital regions question embolic from posterior circulation.  Pt admitted to CIR on 02/22/2015.  SLP evaluation completed on 02/23/2015 with the following results:  Pt presents with no overt s/s of aspiration with regular solids or liquids when assessed at bedside.  No areas of focal weakness were noted on exam and pt demonstrated what appeared to be adequate hyolaryngeal excursion and swift swallow initiation per palpation.   Given that pt has demonstrated adequate toleration of regular solids and thin liquids during trials, recommend that pt's diet be advanced as tolerated per MD and ST will sign off for swallowing goals at this time.   Pt also presents with mild-moderate word finding deficits in conversations in addition to intermittent semantic and phonemic paraphasic errors and perseveration.  Repetition was mildly impaired at the sentence level. Pt is aware of verbal errors but requires cues to correct them in the moment.  Additionally, pt reports mild cognitive "slowing" since CVA and would benefit from more formal evaluation of cognitive function as she was relatively independent prior to admission.   The abovementioned deficits impact pt's ability to complete familiar self care and/or home management tasks safely.  As a result, pt would benefit  from skilled ST while inpatient in order to maximize functional independence and reduce burden of care prior to discharge.  Anticipate that pt would benefit from intermittent supervision for medication and financial management in addition to ST follow up at next level of  care.    Skilled Therapeutic Interventions          Cognitive-linguistic and bedside swallowing evaluation completed with results and recommendations reviewed with patient and family.     SLP Assessment  Patient will need skilled [facility] during CIR admission    Recommendations  Patient destination: Home (versus assisted living ) Follow up Recommendations: Home Health SLP;Outpatient SLP;Other (comment) (intermittent supervision for medications and finances ) Equipment Recommended: None recommended by SLP    SLP Frequency 3 to 5 out of 7 days   SLP Treatment/Interventions Cognitive remediation/compensation;Cueing hierarchy;Functional tasks;Patient/family education;Internal/external aids;Speech/Language facilitation;Environmental controls;Multimodal communication approach   Pain Pain Assessment Pain Assessment: No/denies pain Pain Score: 0-No pain Prior Functioning Cognitive/Linguistic Baseline: Within functional limits Type of Home: Independent living facility  Lives With: Alone Available Help at Discharge: Other (Comment) (paid caregivers ) Education: HS + some college Vocation: Retired  Function:  Eating Eating   Modified Consistency Diet: Yes Eating Assist Level: Supervision or verbal cues           Cognition Comprehension Comprehension assist level: Follows basic conversation/direction with extra time/assistive device  Expression   Expression assist level: Expresses basic 75 - 89% of the time/requires cueing 10 - 24% of the time. Needs helper to occlude trach/needs to repeat words.  Social Interaction Social Interaction assist level: Interacts appropriately with others with medication or extra time (anti-anxiety, antidepressant).  Problem Solving Problem solving assist level: Solves basic 75 - 89% of the time/requires cueing 10 - 24% of the time  Memory Memory assist level: Recognizes or recalls 50 - 74% of the time/requires cueing 25 - 49% of  the time   Short Term Goals: Week 1: SLP Short Term Goal 1 (Week 1): STG=LTG due to ELOS   Refer to Care Plan for Long Term Goals  Recommendations for other services: None  Discharge Criteria: Patient will be discharged from SLP if patient refuses treatment 3 consecutive times without medical reason, if treatment goals not met, if there is a change in medical status, if patient makes no progress towards goals or if patient is discharged from hospital.  The above assessment, treatment plan, treatment alternatives and goals were discussed and mutually agreed upon: by patient and by family  Page, Selinda Orion 02/23/2015, 3:55 PM

## 2015-02-23 NOTE — Progress Notes (Signed)
Initial Nutrition Assessment  DOCUMENTATION CODES:   Not applicable  INTERVENTION:   Continue Ensure Enlive po BID, each supplement provides 350 kcal and 20 grams of protein.  Encourage adequate PO intake.  NUTRITION DIAGNOSIS:   Inadequate oral intake related to poor appetite as evidenced by  (varied meal completion 33-10%).  GOAL:   Patient will meet greater than or equal to 90% of their needs  MONITOR:   PO intake, Supplement acceptance, Weight trends, Labs, I & O's, Diet advancement  REASON FOR ASSESSMENT:   Malnutrition Screening Tool    ASSESSMENT:   81 y.o. female with history of HTN, glaucoma, macular degeneration, BCC, abdominal mass X years who was admitted on 02/13/15 with progressive abdominal pain X 1 week and constipation. She was found to have extensive left colonic diverticulosis with complex mas 7.6 X 7.6 x 6.3 cm , 1.4 cm liver lesion and small pelvic and retroperitoneal nodes. biopsy positive for poorly differentiated adenocarcinoma which was likely arising from outside the colon. MRI brain done revealing multifocal areas of acute infarct affecting cerebellum, left paramedian pons, left thalamus, left posterior parietal and occipital regions question embolic from posterior circulation.    Pt reports appetite has been decreased since admission. Meal completion has been varied from 33-100%. PTA pt reports eating well with no other difficulties. Pt does endorse weight loss. Per Epic weight records, pt with a 4.5% weight loss in 1 month (not found significant). Pt currently has Ensure ordered and has been consuming them. RD to continue with current orders. Pt was encouraged to eat her food at meals and to drink her supplements.   Nutrition-Focused physical exam completed. Findings are no fat depletion, severe muscle depletion, and no edema.   Labs and medications reviewed.   Diet Order:  DIET DYS 2 Room service appropriate?: Yes; Fluid consistency:: Thin  Skin:   Reviewed, no issues  Last BM:  9/18  Height:   Ht Readings from Last 1 Encounters:  02/22/15 '5\' 2"'$  (1.575 m)    Weight:   Wt Readings from Last 1 Encounters:  02/23/15 107 lb 6.4 oz (48.716 kg)    Ideal Body Weight:  50 kg  BMI:  Body mass index is 19.64 kg/(m^2).  Estimated Nutritional Needs:   Kcal:  1500-1700  Protein:  70-80 grams  Fluid:  >/= 1.5 L/day  EDUCATION NEEDS:   No education needs identified at this time  Corrin Parker, MS, RD, LDN Pager # [phone] After hours/ weekend pager # [phone]

## 2015-02-23 NOTE — Progress Notes (Signed)
Patient information reviewed and entered into eRehab system by Marie Noel, RN, CRRN, PPS Coordinator.  Information including medical coding and functional independence measure will be reviewed and updated through discharge.     Per nursing patient was given "Data Collection Information Summary for Patients in Inpatient Rehabilitation Facilities with attached "Privacy Act Statement-Health Care Records" upon admission.  

## 2015-02-24 ENCOUNTER — Inpatient Hospital Stay (HOSPITAL_COMMUNITY): Payer: Medicare Other

## 2015-02-24 ENCOUNTER — Inpatient Hospital Stay (HOSPITAL_COMMUNITY): Payer: Medicare Other | Admitting: Physical Therapy

## 2015-02-24 ENCOUNTER — Inpatient Hospital Stay (HOSPITAL_COMMUNITY): Payer: Medicare Other | Admitting: Speech Pathology

## 2015-02-24 MED ORDER — ENOXAPARIN SODIUM 60 MG/0.6ML ~~LOC~~ SOLN
50.0000 mg | SUBCUTANEOUS | Status: DC
  Administered 2015-02-24 – 2015-03-01 (×6): 50 mg via SUBCUTANEOUS
  Filled 2015-02-24 (×6): qty 0.6

## 2015-02-24 NOTE — Progress Notes (Signed)
Speech Language Pathology Daily Session Note  Patient Details  Name: Katherine Cooke MRN: [id number] Date of Birth: May 21, 1926  Today's Date: 02/24/2015 SLP Individual Time: 1405-1507 SLP Individual Time Calculation (min): 62 min  Short Term Goals: Week 1: SLP Short Term Goal 1 (Week 1): STG=LTG due to ELOS   Skilled Therapeutic Interventions: Skilled treatment session focused on addressing cognitive-linguistic goals. SLP facilitated session by providing sentence completion and phonemic cues during a structured picture naming task.  Patient also required Mod assist cues to self-monitor and correct anomia, paraphasic errors, and verbal perseverations.  Patient reported being fatigued today which appeared to impact expressive communication fluency and accuracy.  SLP also facilitated session with Mod level choice of three cues to facilitate recall of new information.  SLP also initiated education with son and daughter regarding limiting number of visitors and time as well as environmental distractions to maximize successful communication.  Recommend to continue education with patient's involvement and discuss posting guidelines for family and friends.  Continue with current plan of care.   Function:  Cognition Comprehension Comprehension assist level: Understands basic 90% of the time/cues < 10% of the time  Expression   Expression assist level: Expresses basic 75 - 89% of the time/requires cueing 10 - 24% of the time. Needs helper to occlude trach/needs to repeat words.  Social Interaction Social Interaction assist level: Interacts appropriately with others with medication or extra time (anti-anxiety, antidepressant).  Problem Solving Problem solving assist level: Solves basic 75 - 89% of the time/requires cueing 10 - 24% of the time  Memory Memory assist level: Recognizes or recalls 50 - 74% of the time/requires cueing 25 - 49% of the time    Pain Pain Assessment Pain Assessment:  (dry eye,  RN notified)  Therapy/Group: Individual Therapy  Carmelia Roller., CCC-SLP [phone]  [place] 02/24/2015, 4:20 PM

## 2015-02-24 NOTE — Progress Notes (Signed)
Subjective/Complaints: Continent bowel movement this morning 2 Patient requested to be toileted, CNA took the patient to the bathroom. Patient ambulated out with supervision using rolling walker. Patient still a phasic difficulty expressing, indicates that the eye patch seems to help with her vision. ROS- unable to assess due to aphasia Objective: Vital Signs: Blood pressure 110/55, pulse 85, temperature 98.5 F (36.9 C), temperature source Oral, resp. rate 16, height 5\' 2"  (1.575 m), weight 48.716 kg (107 lb 6.4 oz), SpO2 94 %. Dg Chest 2 View  02/22/2015   CLINICAL DATA:  Generalized weakness. Stroke several days ago. No chest pain or shortness of breath. History of hypertension.  EXAM: CHEST  2 VIEW  COMPARISON:  02/19/2015, 02/17/2015  FINDINGS: Heart is mildly enlarged. There is perihilar peribronchial thickening. There are no focal consolidations or effusions. No pulmonary edema.  IMPRESSION: 1. Mild cardiomegaly.  No pulmonary edema. 2. Bronchitic changes.  No focal acute pulmonary abnormality.   Electronically Signed   By: 02/19/2015 M.D.   On: 02/22/2015 16:10   Dg Abd 1 View  02/22/2015   CLINICAL DATA:  Generalized weakness, stroke several days ago, no abdominal pain.  EXAM: ABDOMEN - 1 VIEW  COMPARISON:  Abdominal plain film dated 10/13/2014 and abdominal CT dated 02/13/2015.  FINDINGS: Bowel gas pattern is nonobstructive. No evidence of soft tissue mass or abnormal fluid collection seen, although mass versus abscess was described in the region of the sigmoid colon on earlier CT. No free intraperitoneal air seen.  Degenerative changes noted within the scoliotic thoracolumbar spine. No acute - appearing osseous abnormality. Scarring versus atelectasis noted at each lung base, partially obscured on the right by an elevated hemidiaphragm.  IMPRESSION: Nonobstructive bowel gas pattern and no evidence of acute intra-abdominal or intrapelvic abnormality.  Please note that earlier CT of  02/13/2015 showed a colonic malignancy versus abscess in the left pelvis.   Electronically Signed   By: 04/15/2015 M.D.   On: 02/22/2015 16:13   Results for orders placed or performed during the hospital encounter of 02/22/15 (from the past 72 hour(s))  Culture, Urine     Status: None   Collection Time: 02/22/15  6:47 PM  Result Value Ref Range   Specimen Description URINE, CLEAN CATCH    Special Requests Normal    Culture MULTIPLE SPECIES PRESENT, SUGGEST RECOLLECTION    Report Status 02/23/2015 FINAL   Urinalysis, Routine w reflex microscopic (not at [facility])     Status: None   Collection Time: 02/22/15  6:47 PM  Result Value Ref Range   Color, Urine YELLOW YELLOW   APPearance CLEAR CLEAR   Specific Gravity, Urine 1.018 1.005 - 1.030   pH 7.0 5.0 - 8.0   Glucose, UA NEGATIVE NEGATIVE mg/dL   Hgb urine dipstick NEGATIVE NEGATIVE   Bilirubin Urine NEGATIVE NEGATIVE   Ketones, ur NEGATIVE NEGATIVE mg/dL   Protein, ur NEGATIVE NEGATIVE mg/dL   Urobilinogen, UA 0.2 0.0 - 1.0 mg/dL   Nitrite NEGATIVE NEGATIVE   Leukocytes, UA NEGATIVE NEGATIVE    Comment: MICROSCOPIC NOT DONE ON URINES WITH NEGATIVE PROTEIN, BLOOD, LEUKOCYTES, NITRITE, OR GLUCOSE <1000 mg/dL.  CBC WITH DIFFERENTIAL     Status: Abnormal   Collection Time: 02/23/15  4:04 AM  Result Value Ref Range   WBC 12.4 (H) 4.0 - 10.5 K/uL   RBC 3.16 (L) 3.87 - 5.11 MIL/uL   Hemoglobin 10.2 (L) 12.0 - 15.0 g/dL   HCT 02/25/15 (L) 79.6 - 41.8 %  MCV 96.8 78.0 - 100.0 fL   MCH 32.3 26.0 - 34.0 pg   MCHC 33.3 30.0 - 36.0 g/dL   RDW 13.4 11.5 - 15.5 %   Platelets 549 (H) 150 - 400 K/uL   Neutrophils Relative % 72 %   Neutro Abs 8.9 (H) 1.7 - 7.7 K/uL   Lymphocytes Relative 18 %   Lymphs Abs 2.3 0.7 - 4.0 K/uL   Monocytes Relative 6 %   Monocytes Absolute 0.8 0.1 - 1.0 K/uL   Eosinophils Relative 3 %   Eosinophils Absolute 0.4 0.0 - 0.7 K/uL   Basophils Relative 1 %   Basophils Absolute 0.1 0.0 - 0.1 K/uL  Comprehensive  metabolic panel     Status: Abnormal   Collection Time: 02/23/15  4:04 AM  Result Value Ref Range   Sodium 135 135 - 145 mmol/L   Potassium 4.9 3.5 - 5.1 mmol/L   Chloride 99 (L) 101 - 111 mmol/L   CO2 27 22 - 32 mmol/L   Glucose, Bld 99 65 - 99 mg/dL   BUN 14 6 - 20 mg/dL   Creatinine, Ser 0.70 0.44 - 1.00 mg/dL   Calcium 9.2 8.9 - 10.3 mg/dL   Total Protein 5.9 (L) 6.5 - 8.1 g/dL   Albumin 2.4 (L) 3.5 - 5.0 g/dL   AST 41 15 - 41 U/L   ALT 27 14 - 54 U/L   Alkaline Phosphatase 76 38 - 126 U/L   Total Bilirubin 0.3 0.3 - 1.2 mg/dL   GFR calc non Af Amer >60 >60 mL/min   GFR calc Af Amer >60 >60 mL/min    Comment: (NOTE) The eGFR has been calculated using the CKD EPI equation. This calculation has not been validated in all clinical situations. eGFR's persistently <60 mL/min signify possible Chronic Kidney Disease.    Anion gap 9 5 - 15     HEENT: left eye ptosis Cardio: RRR and no murmur Resp: CTA B/L and unlabored GI: BS positive and non tender Extremity:  Pulses positive and No Edema Skin:   Intact Neuro: Alert/Oriented, Cranial Nerve Abnormalities Left CN 3, Abnormal Sensory difficult to assess secondary to aphasia, Abnormal Motor 4/5 in BUE and BLE, Abnormal FMC Ataxic/ dec FMC, Dysarthric and Aphasic Musc/Skel:  Other no pain with UE or LE ROM Gen NAD Left hemiataxia  Assessment/Plan: 1. 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 Physiatrist is providing close team supervision and 24 hour management of active medical problems listed below. Physiatrist and rehab team continue to assess barriers to discharge/monitor patient progress toward functional and medical goals.  Team conference today please see physician documentation under team conference tab, met with team face-to-face to discuss  problems,progress, and goals. Formulized individual treatment plan based on medical history, underlying problem and comorbidities. FIM: Function - Bathing Position: Shower Body parts bathed by patient: Right arm, Left arm, Chest, Abdomen, Front perineal area, Buttocks, Right upper leg, Left upper leg, Right lower leg, Left lower leg Body parts bathed by helper: Back Bathing not applicable: Right arm, Left arm, Chest, Abdomen, Front perineal area, Buttocks, Right upper leg, Left upper leg, Right lower leg, Left lower leg Assist Level: Touching or steadying assistance(Pt > 75%)  Function- Upper Body Dressing/Undressing What is the patient wearing?: Bra, Pull over shirt/dress Bra - Perfomed by patient: Thread/unthread right bra strap, Thread/unthread  left bra strap Bra - Perfomed by helper: Hook/unhook bra (pull down sports bra) Pull over shirt/dress - Perfomed by patient: Thread/unthread right sleeve, Thread/unthread left sleeve, Put head through opening, Pull shirt over trunk Button up shirt - Perfomed by patient: Thread/unthread right sleeve, Thread/unthread left sleeve, Pull shirt around back, Button/unbutton shirt Assist Level: Touching or steadying assistance(Pt > 75%) Function - Lower Body Dressing/Undressing What is the patient wearing?: Underwear, Pants, Socks, Shoes Position: Wheelchair/chair at [employer] - Performed by patient: Thread/unthread right underwear leg, Thread/unthread left underwear leg, Pull underwear up/down Pants- Performed by patient: Thread/unthread right pants leg, Thread/unthread left pants leg, Pull pants up/down, Fasten/unfasten pants Socks - Performed by patient: Don/doff right sock, Don/doff left sock Shoes - Performed by patient: Don/doff right shoe, Don/doff left shoe, Fasten right, Fasten left Assist Level: Touching or steadying assistance (Pt > 75%)  Function - Toileting Toileting steps completed by patient: Adjust clothing prior to toileting,  Performs perineal hygiene, Adjust clothing after toileting Toileting Assistive Devices: Grab bar or rail  Function - Air cabin crew transfer assistive device: Environmental consultant, Grab bar Assist level to toilet: Touching or steadying assistance (Pt > 75%) Assist level from toilet: Touching or steadying assistance (Pt > 75%)  Function - Chair/bed transfer Chair/bed transfer method: Ambulatory, Stand pivot Chair/bed transfer assist level: Touching or steadying assistance (Pt > 75%) Chair/bed transfer assistive device: Armrests Chair/bed transfer details: Verbal cues for precautions/safety, Verbal cues for technique  Function - Locomotion: Wheelchair Will patient use wheelchair at discharge?: No Type: Manual Max wheelchair distance: 100 Assist Level: Total assistance (Pt < 25%) Assist Level: Total assistance (Pt < 25%) Wheel 150 feet activity did not occur: N/A Function - Locomotion: Ambulation Assistive device: No device Max distance: 200 Assist level: Touching or steadying assistance (Pt > 75%) Assist level: Touching or steadying assistance (Pt > 75%) Assist level: Touching or steadying assistance (Pt > 75%) Assist level: Touching or steadying assistance (Pt > 75%) Assist level: Touching or steadying assistance (Pt > 75%)  Function - Comprehension Comprehension: Auditory Comprehension assist level: Understands basic 90% of the time/cues < 10% of the time  Function - Expression Expression: Verbal Expression assist level: Expresses basic 75 - 89% of the time/requires cueing 10 - 24% of the time. Needs helper to occlude trach/needs to repeat words.  Function - Social Interaction Social Interaction assist level: Interacts appropriately with others - No medications needed.  Function - Problem Solving Problem solving assist level: Solves basic 75 - 89% of the time/requires cueing 10 - 24% of the time  Function - Memory Memory assist level: Recognizes or recalls 75 - 89% of the  time/requires cueing 10 - 24% of the time  Medical Problem List and Plan: 1. 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 DVT Prophylaxis/Anticoagulation: Pharmaceutical: Lovenox 3. Spinal stenosis/Pain Management: Hydrocodone constipating --continue Tylenol prn for pain. Avoid narcotics. Will add K pad.  4. Mood: LCSW to follow for evaluation and support.  5. Neuropsych: This patient is not capable of making decisions on her own behalf. 6. Skin/Wound Care: Routine pressure relief measures. Offer supplements between meals.  7. Fluids/Electrolytes/Nutrition: Monitor I/O. Continue supplements between meals. Variable meal intake 8. HTN: Monitor BID. Avoid hypotension. By mouth intake is improving 9. Colonic lesion with near obstruction: Continue daily Miralax with second dose prn if no BM that day to prevent obstruction. Diet has been advanced to soft but reported to have difficulty swallowing  SLP eval today. Will check KUB.  10. Hypokalemia: Has resolved with supplement. Will d/c Kdur and monitor. 11. Leucocytosis: Has had increase in WBC to 14.6. Monitor of signs of infection. Negative 9/19 CXR 12. Thrombocytosis: Recheck in am.  13. Iron deficiency anemia: Avoiding po iron for now due to GI symptoms.  14. Code status: Discussed--Patient and family desire Full Code.    LOS (Days) 2 A FACE TO FACE EVALUATION WAS PERFORMED  KIRSTEINS,ANDREW E 02/24/2015, 10:19 AM

## 2015-02-24 NOTE — Progress Notes (Signed)
Physical Therapy Session Note  Patient Details  Name: Katherine Cooke MRN: [id number] Date of Birth: [birth year]  Today's Date: 02/24/2015 PT Individual Time: 1030-1130 Treatment Session 2: 1330-1400 PT Individual Time Calculation (min): 60 min  Treatment Session 2: 30 min  Short Term Goals: Week 1:  PT Short Term Goal 1 (Week 1): = LTG due to short LOS  Skilled Therapeutic Interventions/Progress Updates:    Treatment Session 1: Pt received up in recliner, requesting to use bathroom. Therapeutic Activity: See function tab for toileting details. Gait Training - See function tab for details - PT instructs pt in ambulating without AD in middle of hallway to avoid tendency of furniture walking. PT measures gait speed without AD req light CGA for safety x 3 trials: 14.11 sec, 12.44 sec, and 11.53 seconds, instructed to walk at comfortable speed. 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 Less than 4.37 ft/sec indicates "decreased functional efficiency". PT instructs pt in gait speed test at fast speed (CGA for safety, no AD) x 3 trials scoring: 8.24 sec, 7.36 sec, and 7.32 sec; AVG: 7.64, indicating 4.29 ft/sec.  Neuromuscular Reeducation: PT administers the [facility] Test and pt scores 39/56, indicating significant falls risk. Pt practices half of PT session with eye patch, then the 2nd half without it. [name] balance is continuing to improve.  Treatment Session 2: Functional Gait Assessment (FGA) Requirements: A marked 6-m (20-ft) walkway that is marked with a 30.48-cm (12-in) width.  1__ 1. GAIT LEVEL SURFACE: 7.95 sec Instructions: Walk at your normal speed from here to the next mark (6 m[20 ft]). Grading: [place] the highest category that applies. (3) Normal-Walks 6 m (20 ft) in less than 5.5 seconds, no assistive devices, good speed, no evidence for imbalance, normal gait pattern, deviates no more than 15.24 cm (6 in) outside of the 30.48-cm  (12-in) walkway width. (2) Mild impairment-Walks 6 m (20 ft) in less than 7 seconds but greater than 5.5 seconds, uses assistive device, slower speed, mild gait deviations, or deviates 15.24-25.4 cm (6-10 in) outside of the 30.48-cm (12-in) walkway width. (1) Moderate impairment-Walks 6 m (20 ft), slow speed, abnormal gait pattern, evidence for imbalance, or deviates 25.4-38.1 cm (10-15 in) outside of the 30.48-cm (12-in) walkway width. Requires more than 7 seconds to ambulate 6 m (20 ft). (0) Severe impairment-Cannot walk 6 m (20 ft) without assistance,severe gait deviations or imbalance, deviates greater than 38.1 cm (15 in) outside of the 30.48-cm (12-in) walkway width or reaches and touches the wall.  _2_ 2. CHANGE IN GAIT SPEED Instructions: Begin walking at your normal pace (for 1.5 m [5 ft]). When I tell you "go," walk as fast as you can (for 1.5 m [5 ft]). When I tell you "slow," walk as slowly as you can (for 1.5 m [5 ft]). Grading: [place] the highest category that applies. (3) Normal-Able to smoothly change walking speed without loss of balance or gait deviation. Shows a significant difference in walking speeds between normal, fast, and slow speeds. Deviates no more than 15.24 cm (6 in) outside of the 30.48-cm (12-in) walkway width. (2) Mild impairment-Is able to change speed but demonstrates mild gait deviations, deviates 15.24-25.4 cm (6-10 in) outside of the 30.48-cm (12-in) walkway width, or no gait deviations but unable to achieve a significant change in velocity, or uses an assistive device. (1) Moderate impairment-Makes only minor adjustments to walking speed, or accomplishes a change in speed with significant gait deviations, deviates  25.4-38.1 cm (10-15 in) outside the 30.48-cm (12-in) walkway width, or changes speed but loses balance but is able to recover and continue walking. (0) Severe impairment-Cannot change speeds, deviates greater than 38.1 cm (15 in) outside 30.48-cm (12-in)  walkway width, or loses balance and has to reach for wall or be caught.  _1_ 3. GAIT WITH HORIZONTAL HEAD TURNS Instructions: Walk from here to the next mark 6 m (20 ft) away. Begin walking at your normal pace. Keep walking straight; after 3 steps, turn your head to the right and keep walking straight while looking to the right. After 3 more steps, turn your head to the left and keep walking straight while looking left. Continue alternating looking right and left every 3 steps until you have completed 2 repetitions in each direction. Grading: [place] the highest category that applies. (3) Normal-Performs head turns smoothly with no change in gait. Deviates no more than 15.24 cm (6 in) outside 30.48-cm (12-in) walkway width. (2) Mild impairment-Performs head turns smoothly with slight change in gait velocity (eg, minor disruption to smooth gait path), deviates 15.24-25.4 cm (6-10 in) outside 30.48-cm (12-in) walkway width, or uses an assistive device.  (1) Moderate impairment-Performs head turns with moderate change in gait velocity, slows down, deviates 25.4-38.1 cm (10-15 in) outside 30.48-cm (12-in) walkway width but recovers, can continue to walk. (0) Severe impairment-Performs task with severe disruption of gait (eg, staggers 38.1 cm [15 in] outside 30.48-cm (12-in) walkway width, loses balance, stops, or reaches for wall).  _2_ 4. GAIT WITH VERTICAL HEAD TURNS Instructions: Walk from here to the next mark (6 m [20 ft]). Begin walking at your normal pace. Keep walking straight; after 3 steps, tip your head up and keep walking straight while looking up. After 3 more steps, tip your head down, keep walking straight while looking down. Continue alternating looking up and down every 3 steps until you have completed 2 repetitions in each direction. Grading: [place] the highest category that applies. (3) Normal-Performs head turns with no change in gait. Deviates no more than 15.24 cm (6 in) outside 30.48-cm  (12-in) walkway width. (2) Mild impairment-Performs task with slight change in gait velocity (eg, minor disruption to smooth gait path), deviates 15.24-25.4 cm (6-10 in) outside 30.48-cm (12-in) walkway width or uses assistive device. (1) Moderate impairment-Performs task with moderate change in gait velocity, slows down, deviates 25.4-38.1 cm (10-15 in) outside 30.48-cm (12-in) walkway width but recovers, can continue to walk. (0) Severe impairment-Performs task with severe disruption of gait (eg, staggers 38.1 cm [15 in] outside 30.48-cm (12-in) walkway width, loses balance, stops, reaches for wall).  _1_ 5. GAIT AND PIVOT TURN Instructions: Begin with walking at your normal pace. When I tell you, "turn and stop," turn as quickly as you can to face the opposite direction and stop. Grading: [place] the highest category that applies. (3) Normal-Pivot turns safely within 3 seconds and stops quickly with no loss of balance. (2) Mild impairment-Pivot turns safely in _3 seconds and stops with no loss of balance, or pivot turns safely within 3 seconds and stops with mild imbalance, requires small steps to catch balance. (1) Moderate impairment-Turns slowly, requires verbal cueing, or requires several small steps to catch balance following turn and stop. (0) Severe impairment-Cannot turn safely, requires assistance to turn and stop.  _0_ 6. STEP OVER OBSTACLE Instructions: Begin walking at your normal speed. When you come to the shoe box, step over it, not around it, and keep walking. Grading: [place] the  highest category that applies. (3) Normal-Is able to step over 2 stacked shoe boxes taped together (22.86 cm [9 in] total height) without changing gait speed; no evidence of imbalance. (2) Mild impairment-Is able to step over one shoe box (11.43 cm [4.5 in] total height) without changing gait speed; no evidence of imbalance. (1) Moderate impairment-Is able to step over one shoe box (11.43 cm [4.5 in] total  height) but must slow down and adjust steps to clear box safely. May require verbal cueing. (0) Severe impairment-Cannot perform without assistance.  _0_ 7. GAIT WITH NARROW BASE OF SUPPORT Instructions: Walk on the floor with arms folded across the chest, feet aligned heel to toe in tandem for a distance of 3.6 m [12 ft]. The number of steps taken in a straight line are counted for a maximum of 10 steps. Grading: [place] the highest category that applies. (3) Normal-Is able to ambulate for 10 steps heel to toe with no staggering. (2) Mild impairment-Ambulates 7-9 steps. (1) Moderate impairment-Ambulates 4-7 steps. (0) Severe impairment-Ambulates less than 4 steps heel to toe or cannot perform without assistance.  _0_ 8. GAIT WITH EYES CLOSED Instructions: Walk at your normal speed from here to the next mark (6 m [20 ft]) with your eyes closed. Grading: [place] the highest category that applies. (3) Normal-Walks 6 m (20 ft), no assistive devices, good speed, no evidence of imbalance, normal gait pattern, deviates no more than 15.24 cm (6 in) outside 30.48-cm (12-in) walkway width. Ambulates 6 m (20 ft) in less than 7 seconds. (2) Mild impairment-Walks 6 m (20 ft), uses assistive device, slower speed, mild gait deviations, deviates 15.24-25.4 cm (6-10 in) outside 30.48-cm (12-in) walkway width. Ambulates 6 m (20 ft) in less than 9 seconds but greater than 7 seconds. (1) Moderate impairment-Walks 6 m (20 ft), slow speed, abnormal gait pattern, evidence for imbalance, deviates 25.4-38.1 cm (10-15 in) outside 30.48-cm (12-in) walkway width. Requires more than 9 seconds to ambulate 6 m (20 ft). (0) Severe impairment-Cannot walk 6 m (20 ft) without assistance, severe gait deviations or imbalance, deviates greater than 38.1 cm (15 in) outside 30.48-cm (12-in) walkway width or will not attempt task.  _1_ 9. AMBULATING BACKWARDS Instructions: Walk backwards until I tell you to stop. Grading: [place] the highest  category that applies. (3) Normal-Walks 6 m (20 ft), no assistive devices, good speed, no evidence for imbalance, normal gait pattern, deviates no more than 15.24 cm (6 in) outside 30.48-cm (12-in) walkway width. (2) Mild impairment-Walks 6 m (20 ft), uses assistive device, slower speed, mild gait deviations, deviates 15.24-25.4 cm (6-10 in) outside 30.48-cm (12-in) walkway width. (1) Moderate impairment-Walks 6 m (20 ft), slow speed, abnormal gait pattern, evidence for imbalance, deviates 25.4-38.1 cm (10-15 in) outside 30.48-cm (12-in) walkway width. (0) Severe impairment-Cannot walk 6 m (20 ft) without assistance, severe gait deviations or imbalance, deviates greater than 38.1 cm (15 in) outside 30.48-cm (12-in) walkway width or will not attempt task.  _2_ 10. STEPS Instructions: Walk up these stairs as you would at home (ie, using the rail if necessary). At the top turn around and walk down. Grading: [place] the highest category that applies. (3) Normal-Alternating feet, no rail. (2) Mild impairment-Alternating feet, must use rail. (1) Moderate impairment-Two feet to a stair; must use rail. (0) Severe impairment-Cannot do safely.  TOTAL SCORE: _10_____ /30 (MAXIMUM SCORE=30)  Scores of ? 22/30 on the FGA were found to be effective in predicting falls, Sensitivity 85%, Specificity 86% Scores of ?  20/30 on the FGA were optimal to predict older adults residing in community dwellings who would sustain unexplained falls in the next 6 months, Sensitivity 100%, Specificity 76% ([place], 2010; aged 43 to 71, Older Adults) [place]: 4.2 points for CVA Augustin Coupe et al, 2010) [facility]: 8 points for Balance and Vestibular Disorders Marjorie Smolder and Augustin Coupe, 2014)  Pt participates well during second session and this high level balance test continues to reveal pt's balance deficit components. Continue per PT POC.   Therapy Documentation Precautions:  Precautions Precautions: Fall Precaution Comments: Visual  deficits; glaucoma, macular degeneration, blurring of vision R eye, metastatic cancer, spinal stenosis Restrictions Weight Bearing Restrictions: No Pain: Pain Assessment Pain Assessment: No/denies pain Treatment Session 2: Pt denies pain.   Balance: Balance Balance Assessed: Yes Standardized Balance Assessment Standardized Balance Assessment: Berg Balance Test Berg Balance Test Sit to Stand: Able to stand without using hands and stabilize independently Standing Unsupported: Able to stand safely 2 minutes Sitting with Back Unsupported but Feet Supported on Floor or Stool: Able to sit safely and securely 2 minutes Stand to Sit: Sits safely with minimal use of hands Transfers: Able to transfer with verbal cueing and /or supervision Standing Unsupported with Eyes Closed: Able to stand 10 seconds safely Standing Ubsupported with Feet Together: Able to place feet together independently and stand 1 minute safely From Standing, Reach Forward with Outstretched Arm: Can reach confidently >25 cm (10") From Standing Position, Pick up Object from Floor: Able to pick up shoe, needs supervision From Standing Position, Turn to Look Behind Over each Shoulder: Looks behind from both sides and weight shifts well Turn 360 Degrees: Needs close supervision or verbal cueing Standing Unsupported, Alternately Place Feet on Step/Stool: Able to complete >2 steps/needs minimal assist Standing Unsupported, One Foot in Front: Loses balance while stepping or standing Standing on One Leg: Unable to try or needs assist to prevent fall Total Score: 39   See Function Navigator for Current Functional Status.   Therapy/Group: Individual Therapy  HYSLOP,AMANDA M 02/24/2015, 10:38 AM

## 2015-02-24 NOTE — Progress Notes (Signed)
Occupational Therapy Session Note  Patient Details  Name: EMSLEY CUSTER MRN: [id number] Date of Birth: December 02, 1925  Today's Date: 02/24/2015 OT Individual Time: 0900-1000 OT Individual Time Calculation (min): 60 min    Short Term Goals: Week 1:  OT Short Term Goal 1 (Week 1): STG=LTG d/t short LOS  Skilled Therapeutic Interventions/Progress Updates:    Pt engaged in BADL retraining including bathing at shower level and dressing with sit<>stand from sink.  Pt could not recall if she showered the previous day but agreeable to bathing at shower level this morning.  Pt amb with RW to bathroom for shower and completed bathing with sit<>stand from shower seat.  Pt required assistance fastening bra but otherwise completed all bathing/dressing tasks with supervision/set up.  Pt requires more than a reasonable amount of time to complete tasks with multiple rest breaks.  Pt expressed increased fatigue at end of session.  Pt continues to c/o blurring of vision in R eye and requested use of eye patch.  Focus on functional amb with RW, sit<>stand, standing balance, task initiation, sequencing, and safety awareness.  Therapy Documentation Precautions:  Precautions Precautions: Fall Precaution Comments: Visual deficits; glaucoma, macular degeneration, blurring of vision R eye, metastatic cancer, spinal stenosis Restrictions Weight Bearing Restrictions: No  Pain: Pain Assessment Pain Assessment: No/denies pain ADL: ADL ADL Comments: see Functional Assessment  See Function Navigator for Current Functional Status.   Therapy/Group: Individual Therapy  Leroy Libman 02/24/2015, 10:02 AM

## 2015-02-25 ENCOUNTER — Inpatient Hospital Stay (HOSPITAL_COMMUNITY): Payer: Medicare Other

## 2015-02-25 ENCOUNTER — Inpatient Hospital Stay (HOSPITAL_COMMUNITY): Payer: Medicare Other | Admitting: Physical Therapy

## 2015-02-25 ENCOUNTER — Inpatient Hospital Stay (HOSPITAL_COMMUNITY): Payer: Medicare Other | Admitting: Speech Pathology

## 2015-02-25 NOTE — Progress Notes (Signed)
ANTICOAGULATION CONSULT NOTE - Follow Up  Pharmacy Consult for Enoxaparin Indication: Hypercoag state due to metastatic cancer  Allergies  Allergen Reactions  . Lyrica [Pregabalin] Shortness Of Breath   Patient Measurements: Height: '5\' 2"'$  (157.5 cm) Weight: 113 lb 8.6 oz (51.5 kg) IBW/kg (Calculated) : 50.1  Vital Signs: Temp: 97.6 F (36.4 C) (09/22 0554) Temp Source: Oral (09/22 0554) BP: 135/52 mmHg (09/22 0554) Pulse Rate: 86 (09/22 0554)  Labs:  Recent Labs  02/23/15 0404  HGB 10.2*  HCT 30.6*  PLT 549*  CREATININE 0.70   Estimated Creatinine Clearance: 39.2 mL/min (by C-G formula based on Cr of 0.7).  Medical History: Past Medical History  Diagnosis Date  . Hypertension   . Spinal stenosis   . Basal cell carcinoma of nose 2005  . Squamous cell carcinoma of left hip 2005  . Colon cancer dx'd 02/16/15  . Diabetes mellitus without complication   . Glaucoma   . Macular degeneration    Assessment: 81 yo F on Lovenox for hypercoagulable state d/t metastatic cancer with emoblic stroke. Hgb 10.2, plt 549 on 9/20, stable. Scr 0.7, est. crcl = 35-40 ml/min. Will continue '1mg'$ /kg Q 24 hrs dosing given borderline renal function and borderline weight. She is also on Aspirin 325 mg daily. No bleeding complications noted.  Goal of Therapy:  Monitor platelets by anticoagulation protocol: Yes   Plan:  Continue enoxaparin '50mg'$  [place] Q24 Monitor CBC, s/s of bleed    Maryanna Shape, PharmD, BCPS  Clinical Pharmacist  Pager: [phone]  Thank you for allowing pharmacy to be part of this patients care team. 02/25/2015,8:08 AM

## 2015-02-25 NOTE — Progress Notes (Signed)
Speech Language Pathology Daily Session Note  Patient Details  Name: BANITA LEHN MRN: [id number] Date of Birth: 1926/03/15  Today's Date: 02/25/2015 SLP Individual Time: 0254-2706 SLP Individual Time Calculation (min): 32 min  Short Term Goals: Week 1: SLP Short Term Goal 1 (Week 1): STG=LTG due to ELOS   Skilled Therapeutic Interventions:  Pt was seen for skilled ST targeting ongoing pt and family education.  Upon arrival, pt was partially reclined in bed, asleep, but easily awakened to voice and light touch and agreeable to participate in [place].  SLP provided skilled education with pt and pt's son regarding communication techniques to facilitate pt's ability to convey her needs and wants independently.  SLP emphasized limiting visitors to 1-2 people at a time, limiting distractions, keeping visits with family and friends brief, and allowing pt extra time to respond.  Pt was in agreement with the abovementioned recommendations and was able to verbally dictate a simplified list of recommendations with mod assist verbal cues.  Sign was placed on the door to pt's room.  Pt was left in bed with call bell within reach and son at bedside.  Continue per current plan of care.    Function:  Eating Eating                 Cognition Comprehension Comprehension assist level: Understands basic 90% of the time/cues < 10% of the time  Expression   Expression assist level: Expresses basic 75 - 89% of the time/requires cueing 10 - 24% of the time. Needs helper to occlude trach/needs to repeat words.  Social Interaction Social Interaction assist level: Interacts appropriately with others with medication or extra time (anti-anxiety, antidepressant).  Problem Solving Problem solving assist level: Solves basic 75 - 89% of the time/requires cueing 10 - 24% of the time  Memory Memory assist level: Recognizes or recalls 50 - 74% of the time/requires cueing 25 - 49% of the time    Pain Pain Assessment Pain  Assessment: No/denies pain  Therapy/Group: Individual Therapy  Page, Selinda Orion 02/25/2015, 3:44 PM

## 2015-02-25 NOTE — Progress Notes (Signed)
Social Work Assessment and Plan  Patient Details  Name: Katherine Cooke MRN: [id number] Date of Birth: Sep 08, 1925  Today's Date: 02/25/2015  Problem List:  Patient Active Problem List   Diagnosis Date Noted  . Embolic stroke involving cerebellar artery 02/22/2015  . Ataxia 02/22/2015  . Stroke with cerebral ischemia   . Malignancy   . CVA (cerebral infarction) 02/17/2015  . Anemia of chronic disease 02/15/2015  . Leukocytosis   . Colonic mass 02/13/2015  . Osteopenia 01/13/2015  . Hypertension   . Basal cell carcinoma of nose   . Squamous cell carcinoma of left hip    Past Medical History:  Past Medical History  Diagnosis Date  . Hypertension   . Spinal stenosis   . Basal cell carcinoma of nose 2005  . Squamous cell carcinoma of left hip 2005  . Colon cancer dx'd 02/16/15  . Diabetes mellitus without complication   . Glaucoma   . Macular degeneration    Past Surgical History:  Past Surgical History  Procedure Laterality Date  . Breast biopsy  1950's ; 1952 ; 1954    x 3 - Benign  . Tonsillectomy    . Flexible sigmoidoscopy N/A 02/15/2015    Procedure: FLEXIBLE SIGMOIDOSCOPY;  Surgeon: Ronald Lobo, MD;  Location: [facility];  Service: Endoscopy;  Laterality: N/A;   Social History:  reports that she has never smoked. She has never used smokeless tobacco. She reports that she drinks about 4.2 oz of alcohol per week. She reports that she does not use illicit drugs.  Family / Support Systems Marital Status: Widow/Widower How Long?: 5 years ago Patient Roles: Parent (Son and dtr live here locally and one son in [place].) Children: Katherine Cooke - son - [phone] (h); [phone] (c) ; Katherine Cooke - dtr [phone] Other Supports: [facility] Anticipated Caregiver: likely will need ALF vs. 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 Letta Pate feels pt is not able to make her own decisions at this time.  Pt has supportive children.   Abuse/Neglect Physical Abuse: Denies Verbal Abuse: Denies Sexual Abuse: Denies Exploitation of patient/patient's resources: Denies Self-Neglect: Denies  Emotional Status [name] affect, behavior and adjustment status: Pt reports doing well emotionally and is not experiencing any signs/symptoms of anxiety/depression. Recent Psychosocial Issues: none reported Psychiatric History: none reported Substance Abuse History: none reported  Patient / Family Perceptions, Expectations & Goals Pt/Family understanding of illness & functional limitations: Pt and son report a good understanding of pt's condition and have had their questions answered. Premorbid pt/family roles/activities: Pt enjoys activities she does at [employer], theater, activities at facility Anticipated changes in roles/activities/participation: Pt will have the support of [place] staff. Pt/family expectations/goals: Pt wants to get stronger and get back to [facility].  Community Resources [employer]: Other (Comment) (Pt is from [place]) Premorbid Home Care/DME Agencies: None Transportation available at discharge: family Resource referrals recommended: Support group (specify)  Discharge Planning Living Arrangements: Alone (Independent living at [facility]) [place]: Children, Other (Comment) ([facility]) Type of Residence: Private residence (at [place]) Administrator, sports: Multimedia programmer (specify) (Marine scientist) Museum/gallery curator Resources: [place]  Referred: No Living Expenses: Other (Comment) ([facility]) Money Management: Patient Does the patient have any problems obtaining your medications?: No Home Management: Pt was independent PTA. Patient/Family Preliminary Plans: Pt plans to return to [facility] in their rehab unit. Barriers to Discharge: Self care Social Work Anticipated Follow Up Needs: SNF Expected length of stay: 7 to 10 days  Clinical Impression CSW met with pt and her son, Katherine Cooke, to introduce self and role of CSW, as well at to complete assessment.  Pt has been in an Independent Living Apartment at [facility] for the last 7 years and she enjoys it there.  She realizes she cannot return to her apartment right away, especially as she is trying to regain strength to prepare for upcoming surgery.  She is fine with going to the rehab area at [facility] after d/c from [facility].  CSW told pt/son that CSW will facilitate this for pt.  She was appreciative.  No other questions/concerns/needs at this time, but CSW will continue to follow pt to assist as needed.  Prevatt, Jennifer Capps 02/25/2015, 2:34 PM    

## 2015-02-25 NOTE — Progress Notes (Signed)
Subjective/Complaints: Patient is trying to get dressed by herself. She gets very fatigued. She denies any abdominal pain. She does have loose stools. We discussed that loose stools are necessary to avoid obstruction ROS- unable to assess due to aphasia Objective: Vital Signs: Blood pressure 135/52, pulse 86, temperature 97.6 F (36.4 C), temperature source Oral, resp. rate 18, height 5\' 2"  (1.575 m), weight 51.5 kg (113 lb 8.6 oz), SpO2 95 %. No results found. Results for orders placed or performed during the hospital encounter of 02/22/15 (from the past 72 hour(s))  Culture, Urine     Status: None   Collection Time: 02/22/15  6:47 PM  Result Value Ref Range   Specimen Description URINE, CLEAN CATCH    Special Requests Normal    Culture MULTIPLE SPECIES PRESENT, SUGGEST RECOLLECTION    Report Status 02/23/2015 FINAL   Urinalysis, Routine w reflex microscopic (not at [facility])     Status: None   Collection Time: 02/22/15  6:47 PM  Result Value Ref Range   Color, Urine YELLOW YELLOW   APPearance CLEAR CLEAR   Specific Gravity, Urine 1.018 1.005 - 1.030   pH 7.0 5.0 - 8.0   Glucose, UA NEGATIVE NEGATIVE mg/dL   Hgb urine dipstick NEGATIVE NEGATIVE   Bilirubin Urine NEGATIVE NEGATIVE   Ketones, ur NEGATIVE NEGATIVE mg/dL   Protein, ur NEGATIVE NEGATIVE mg/dL   Urobilinogen, UA 0.2 0.0 - 1.0 mg/dL   Nitrite NEGATIVE NEGATIVE   Leukocytes, UA NEGATIVE NEGATIVE    Comment: MICROSCOPIC NOT DONE ON URINES WITH NEGATIVE PROTEIN, BLOOD, LEUKOCYTES, NITRITE, OR GLUCOSE <1000 mg/dL.  CBC WITH DIFFERENTIAL     Status: Abnormal   Collection Time: 02/23/15  4:04 AM  Result Value Ref Range   WBC 12.4 (H) 4.0 - 10.5 K/uL   RBC 3.16 (L) 3.87 - 5.11 MIL/uL   Hemoglobin 10.2 (L) 12.0 - 15.0 g/dL   HCT 02/25/15 (L) 29.5 - 89.4 %   MCV 96.8 78.0 - 100.0 fL   MCH 32.3 26.0 - 34.0 pg   MCHC 33.3 30.0 - 36.0 g/dL   RDW 23.6 61.7 - 35.1 %   Platelets 549 (H) 150 - 400 K/uL   Neutrophils Relative % 72 %    Neutro Abs 8.9 (H) 1.7 - 7.7 K/uL   Lymphocytes Relative 18 %   Lymphs Abs 2.3 0.7 - 4.0 K/uL   Monocytes Relative 6 %   Monocytes Absolute 0.8 0.1 - 1.0 K/uL   Eosinophils Relative 3 %   Eosinophils Absolute 0.4 0.0 - 0.7 K/uL   Basophils Relative 1 %   Basophils Absolute 0.1 0.0 - 0.1 K/uL  Comprehensive metabolic panel     Status: Abnormal   Collection Time: 02/23/15  4:04 AM  Result Value Ref Range   Sodium 135 135 - 145 mmol/L   Potassium 4.9 3.5 - 5.1 mmol/L   Chloride 99 (L) 101 - 111 mmol/L   CO2 27 22 - 32 mmol/L   Glucose, Bld 99 65 - 99 mg/dL   BUN 14 6 - 20 mg/dL   Creatinine, Ser 02/25/15 0.44 - 1.00 mg/dL   Calcium 9.2 8.9 - 1.38 mg/dL   Total Protein 5.9 (L) 6.5 - 8.1 g/dL   Albumin 2.4 (L) 3.5 - 5.0 g/dL   AST 41 15 - 41 U/L   ALT 27 14 - 54 U/L   Alkaline Phosphatase 76 38 - 126 U/L   Total Bilirubin 0.3 0.3 - 1.2 mg/dL   GFR calc  non Af Amer >60 >60 mL/min   GFR calc Af Amer >60 >60 mL/min    Comment: (NOTE) The eGFR has been calculated using the CKD EPI equation. This calculation has not been validated in all clinical situations. eGFR's persistently <60 mL/min signify possible Chronic Kidney Disease.    Anion gap 9 5 - 15     HEENT: left eye ptosis Cardio: RRR and no murmur Resp: CTA B/L and unlabored GI: BS positive and non tender Extremity:  Pulses positive and No Edema Skin:   Intact Neuro: Alert/Oriented, Cranial Nerve Abnormalities Left CN 3, Abnormal Sensory difficult to assess secondary to aphasia, Abnormal Motor 4/5 in BUE and BLE, Abnormal FMC Ataxic/ dec FMC, Dysarthric and Aphasic Musc/Skel:  Other no pain with UE or LE ROM Gen NAD Left hemiataxia  Assessment/Plan: 1. 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 Physiatrist is providing close team supervision  and 24 hour management of active medical problems listed below. Physiatrist and rehab team continue to assess barriers to discharge/monitor patient progress toward functional and medical goals.  Team conference today please see physician documentation under team conference tab, met with team face-to-face to discuss problems,progress, and goals. Formulized individual treatment plan based on medical history, underlying problem and comorbidities. FIM: Function - Bathing Position: Shower Body parts bathed by patient: Right arm, Left arm, Chest, Abdomen, Front perineal area, Buttocks, Right upper leg, Left upper leg, Right lower leg, Left lower leg Body parts bathed by helper: Back Bathing not applicable: Right arm, Left arm, Chest, Abdomen, Front perineal area, Buttocks, Right upper leg, Left upper leg, Right lower leg, Left lower leg Assist Level: Touching or steadying assistance(Pt > 75%)  Function- Upper Body Dressing/Undressing What is the patient wearing?: Bra, Button up shirt Bra - Perfomed by patient: Thread/unthread right bra strap, Thread/unthread left bra strap, Hook/unhook bra (pull down sports bra) Bra - Perfomed by helper: Hook/unhook bra (pull down sports bra) Pull over shirt/dress - Perfomed by patient: Thread/unthread right sleeve, Thread/unthread left sleeve, Put head through opening, Pull shirt over trunk Button up shirt - Perfomed by patient: Thread/unthread right sleeve, Thread/unthread left sleeve, Pull shirt around back, Button/unbutton shirt Assist Level: Set up Set up : To obtain clothing/put away Function - Lower Body Dressing/Undressing What is the patient wearing?: Pants, Socks, Shoes Position: Sitting EOB (using bedrail as stabilizer) Underwear - Performed by patient: Thread/unthread right underwear leg, Thread/unthread left underwear leg, Pull underwear up/down Pants- Performed by patient: Thread/unthread right pants leg, Thread/unthread left pants leg, Pull pants  up/down, Fasten/unfasten pants Socks - Performed by patient: Don/doff right sock, Don/doff left sock Shoes - Performed by patient: Don/doff right shoe, Don/doff left shoe, Fasten right, Fasten left Assist Level: Set up  Function - Toileting Toileting steps completed by patient: Adjust clothing prior to toileting, Performs perineal hygiene, Adjust clothing after toileting Toileting Assistive Devices: Grab bar or rail Assist level: Supervision or verbal cues  Function Midwife transfer assistive device: Grab bar Assist level to toilet: Supervision or verbal cues Assist level from toilet: Supervision or verbal cues  Function - Chair/bed transfer Chair/bed transfer method: Ambulatory Chair/bed transfer assist level: Supervision or verbal cues Chair/bed transfer assistive device: Armrests Chair/bed transfer details: Verbal cues for technique  Function - Locomotion: Wheelchair Will patient use wheelchair at discharge?: No Type: Manual Max wheelchair distance: 100 Assist  Level: Total assistance (Pt < 25%) Assist Level: Total assistance (Pt < 25%) Wheel 150 feet activity did not occur: N/A Function - Locomotion: Ambulation Assistive device: No device Max distance: 150' x 2 reps Assist level: Supervision or verbal cues Assist level: Supervision or verbal cues Assist level: Supervision or verbal cues Assist level: Supervision or verbal cues Assist level: Touching or steadying assistance (Pt > 75%)  Function - Comprehension Comprehension: Auditory Comprehension assist level: Understands basic 90% of the time/cues < 10% of the time  Function - Expression Expression: Verbal Expression assist level: Expresses basic 75 - 89% of the time/requires cueing 10 - 24% of the time. Needs helper to occlude trach/needs to repeat words.  Function - Social Interaction Social Interaction assist level: Interacts appropriately with others with medication or extra time (anti-anxiety,  antidepressant).  Function - Problem Solving Problem solving assist level: Solves basic 75 - 89% of the time/requires cueing 10 - 24% of the time  Function - Memory Memory assist level: Recognizes or recalls 50 - 74% of the time/requires cueing 25 - 49% of the time Patient normally able to recall (first 3 days only): Current season, That he or she is in a hospital  Medical Problem List and Plan: 1. 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 DVT Prophylaxis/Anticoagulation: Pharmaceutical: Lovenox 3. Spinal stenosis/Pain Management: Hydrocodone constipating --continue Tylenol prn for pain. Avoid narcotics. Will add K pad.  4. Mood: LCSW to follow for evaluation and support.  5. Neuropsych: This patient is not capable of making decisions on her own behalf. 6. Skin/Wound Care: Routine pressure relief measures. Offer supplements between meals. Monitor perirectal scan given loose BMs 7. Fluids/Electrolytes/Nutrition: Monitor I/O. Continue supplements between meals. Variable meal intake 8. HTN: Monitor BID. Avoid hypotension. By mouth intake is improving 9. Colonic lesion with near obstruction: Continue daily Miralax with second dose prn if no BM that day to prevent obstruction, no clinical signs, frequent loose bowel movements  10. Hypokalemia: Has resolved with supplement. Will d/c Kdur and monitor.repeat bmet in a.m. 11. Leucocytosis: Has had increase in WBC to 14.6. Monitor of signs of infection. Negative 9/19 CXR 12. Thrombocytosis: Recheck in am.  13. Iron deficiency anemia: Avoiding po iron for now due to GI symptoms.  14. Code status: Discussed--Patient and family desire Full Code.    LOS (Days) 3 A FACE TO FACE EVALUATION WAS PERFORMED  KIRSTEINS,ANDREW E 02/25/2015, 9:49 AM

## 2015-02-25 NOTE — Progress Notes (Signed)
[facility] Individual Statement of Services  Patient Name:  Katherine Cooke  Date:  02/25/2015  Welcome to the [facility].  Our goal is to provide you with an individualized program based on your diagnosis and situation, designed to meet your specific needs.  With this comprehensive rehabilitation program, you will be expected to participate in at least 3 hours of rehabilitation therapies Monday-Friday, with modified therapy programming on the weekends.  Your rehabilitation program will include the following services:  Physical Therapy (PT), Occupational Therapy (OT), Speech Therapy (ST), 24 hour per day rehabilitation nursing, Case Management (Social Worker), Rehabilitation Medicine, Nutrition Services and Pharmacy Services  Weekly team conferences will be held on Wednesdays to discuss your progress.  Your Social Worker will talk with you frequently to get your input and to update you on team discussions.  Team conferences with you and your family in attendance may also be held.  Expected length of stay:  7 to 10 days Overall anticipated outcome:  Supervision  Depending on your progress and recovery, your program may change. Your Social Worker will coordinate services and will keep you informed of any changes. Your Social Worker's name and contact numbers are listed  below.  The following services may also be recommended but are not provided by the [facility] will be made to provide these services after discharge if needed.  Arrangements include referral to agencies that provide these services.  Your insurance has been verified to be:  [employer] Your primary doctor is:  Dr. Idamae Schuller  Pertinent information will be shared with your doctor and your insurance company.  Social Worker:  Alfonse Alpers, LCSW  [phone] or [phone]  Information discussed with and copy given to patient by: Trey Sailors, 02/25/2015, 2:08 PM

## 2015-02-25 NOTE — Progress Notes (Signed)
Occupational Therapy Session Note  Patient Details  Name: Katherine Cooke MRN: [id number] Date of Birth: 1925/06/13  Today's Date: 02/25/2015 OT Individual Time: 8115-7262 OT Individual Time Calculation (min): 75 min    Short Term Goals: Week 1:  OT Short Term Goal 1 (Week 1): STG=LTG d/t short LOS  Skilled Therapeutic Interventions/Progress Updates: Therapeutic activity with focus on improved memory, atteniton, problem-solving, sequenceing, dynamic standing balance, and endurance.   Pt received seated in recliner awaiting therapist.  Pt deferred bathing, dressing, or grooming and elected to participate in tasks to challenge her endurance, balance, memory and awareness.   Pt ambulates with therapist to BI gym, approx 150', rests and is instructed on gameplay (GoBan) to challenge memory and problem-solving.   Pt requires max multimodal cues initially to orient to game and with poor immediate recall evident primarily with word-finding for game name and directions of play.   Pt completes 2 games and uses appropriate strategy with only min vc to redirect.   Pt then ambulates to day room and is challenged to demo how she plays one of her favorite games "solitaire."   After setup with cards, pt is unable to initiate gameplay or create [place], or [employer].   OT presents setup for gameplay but again pt is unable to proceed.    Pt requires max assist to play while standing for 10 minutes.   Game left incomplete d/t pt's mild frustration and poor recall of rules of game.   Pt then ambulates to ADL kitchen, approx 300', rests for 5 min and performs sorting stored dry food items (Jello, Pudding) from one cabinet while standing at counter top.  Pt reports fatigue at end of session and is escorted back to her room in w/c for safety.   Pt requests assist with toileting at end of session; RN tech alerted to need for supervision of pt during mobility from toilet.    Therapy  Documentation Precautions:  Precautions Precautions: Fall Precaution Comments: Visual deficits; glaucoma, macular degeneration, blurring of vision R eye, metastatic cancer, spinal stenosis Restrictions Weight Bearing Restrictions: No  Pain: Pain Assessment Pain Assessment: 0-10 Pain Score: 5  Pain Type: Acute pain Pain Location: Leg  Pain Orientation: Other (Comment) (Both) Pain Descriptors / Indicators: Aching (heavy, tired) Pain Onset: Unable to tell Pain Intervention(s): Distraction Multiple Pain Sites: No  ADL: ADL ADL Comments: see Functional Assessment  See Function Navigator for Current Functional Status.   Therapy/Group: Individual Therapy  [place] 02/25/2015, 12:36 PM

## 2015-02-25 NOTE — Patient Care Conference (Signed)
Inpatient RehabilitationTeam Conference and Plan of Care Update Date: 02/24/2015   Time: 11:30 AM    Patient Name: Katherine Cooke      Medical Record Number: [id number]  Date of Birth: Dec 06, 1925 Sex: Female         Room/Bed: 4W25C/4W25C-01 Payor Info: Payor: Theme park manager MEDICARE / Plan: [employer] MEDICARE / Product Type: *No Product type* /    Admitting Diagnosis: BIL CVA  Admit Date/Time:  02/22/2015  3:01 PM Admission Comments: No comment available   Primary Diagnosis:  Embolic stroke involving cerebellar artery Principal Problem: Embolic stroke involving cerebellar artery  Patient Active Problem List   Diagnosis Date Noted  . Embolic stroke involving cerebellar artery 02/22/2015  . Ataxia 02/22/2015  . Stroke with cerebral ischemia   . Malignancy   . CVA (cerebral infarction) 02/17/2015  . Anemia of chronic disease 02/15/2015  . Leukocytosis   . Colonic mass 02/13/2015  . Osteopenia 01/13/2015  . Hypertension   . Basal cell carcinoma of nose   . Squamous cell carcinoma of left hip     Expected Discharge Date: Expected Discharge Date: 03/02/15  Team Members Present: Physician leading conference: Dr. Alysia Penna Social Worker Present: Alfonse Alpers, LCSW Nurse Present: Heather Roberts, RN PT Present: Guilford Shi, PT OT Present: Willeen Cass, OT SLP Present: Windell Moulding, SLP PPS Coordinator present : Daiva Nakayama, RN, CRRN     Current Status/Progress Goal Weekly Team Focus  Medical   Pt is mobile, aphasic, no sign of obstruction  Home at [facility] vs AL  Improve endurance   Bowel/Bladder   Continent of bowel and bladder; LBM 02/25/15.   Remain continent of bowel and bladder.   offer toileting Q2 hours and prn   Swallow/Nutrition/ Hydration   Dys 2, thin liquids may be advanced as tolerated ST signed off for swallowing goals           ADL's   Overall Min Assist for transfers, bathing/dressing, Intermittent assist for higher level cognitive functioning:  problem-solving, sequencing, communication, orientation, awareness and memory  Overall Mod I for BADL, supervision for bath transfers/bathing  Endurance, dynamic standing balance, functional mobility, cognitive remediation (memory, awareness, attention)   Mobility   steadying assist overall  mod I  high level balance, ambulation, compensation with visual deficits   Communication   mild-moderate word finding deficits   mod I   education and carryover of compensatory strategies    Safety/Cognition/ Behavioral Observations  mild deficits   mod I   memory, higher level problem solving, emergent awareness of verbal deficits    Pain   Occasional pain in legs. Tylenol Q 4 prn.   Pain at 4 or less on a scale of 0-10  Assess for pain Q2 hours and prn    Skin   Clean. Dry. Intact   Remain free of breakdown and infection        Rehab Goals Patient on target to meet rehab goals: Yes Rehab Goals Revised: none - pt's first conference *See Care Plan and progress notes for long and short-term goals.  Barriers to Discharge: abd mass which may enlarge    Possible Resolutions to Barriers:  arrange for Surgery OV post D/C from rehab    Discharge Planning/Teaching Needs:  Pt to d/c to [facility] Rehab unit when her time on CIR is complete, in order for pt to get stronger prior to required upcoming surgery.  Defer to the next venue.   Team Discussion:  Pt has an abdominal  mass outside of her colon, but it is pushing on her colon.  MDs believe it is an ovarian mass.  She needs surgery once she is stronger, not before 2 weeks.  Pt will need rehab at [facility] before she can go for surgery or be on her own in her independent living apartment.  Pt is using an eye patch for isolated tasks and is working toward supervision level goals with ADLs.  Her BERG was 56 with PT and needs assistance with walker.  She gets fatigued and this sometimes looks like cognitive impairments, but team feels it is due to fatigue.  ST  also thinks it is language related and mostly with expressive language.  Pt reports feeling her processing is slower.  Swallowing is fine.  Son requested that visitors be limited and nursing staff was helping with that.  Revisions to Treatment Plan:  none   Continued Need for Acute Rehabilitation Level of Care: The patient requires daily medical management by a physician with specialized training in physical medicine and rehabilitation for the following conditions: Daily direction of a multidisciplinary physical rehabilitation program to ensure safe treatment while eliciting the highest outcome that is of practical value to the patient.: Yes Daily medical management of patient stability for increased activity during participation in an intensive rehabilitation regime.: Yes Daily analysis of laboratory values and/or radiology reports with any subsequent need for medication adjustment of medical intervention for : Neurological problems;Other  Prevatt, Silvestre Mesi 02/26/2015, 9:18 AM

## 2015-02-25 NOTE — IPOC Note (Signed)
Overall Plan of Care [facility]) Patient Details Name: Katherine Cooke MRN: [id number] DOB: 03/17/1926  Admitting Diagnosis: BIL CVA  Hospital Problems: Principal Problem:   Embolic stroke involving cerebellar artery Active Problems:   Hypertension   Leukocytosis   Anemia of chronic disease   Ataxia     Functional Problem List: Nursing Safety, Endurance, Medication Management, Nutrition  PT Balance, Motor, Pain, Other (comment) (vision)  OT Cognition, Safety, Endurance, Motor, Balance  SLP Cognition, Linguistic  TR         Basic ADL's: OT Bathing, Dressing, Toileting     Advanced  ADL's: OT       Transfers: PT Bed to Chair, Car, Manufacturing systems engineer, Metallurgist: PT Ambulation, Stairs     Additional Impairments: OT None  SLP Communication, Social Cognition expression Memory, Awareness  TR      Anticipated Outcomes Item Anticipated Outcome  Self Feeding Mod I  Swallowing      Basic self-care  Mod I  Toileting  Mod I   Bathroom Transfers Supervision  Bowel/Bladder  cont of b+B with min assist  Transfers  Mod I  Locomotion  Mod I  Communication  mod I   Cognition  mod I   Pain  Pain controlled at or below level 5 with prn medication and heat application  Safety/Judgment  MAintain safety with cues   Therapy Plan: PT Intensity: Minimum of 1-2 x/day ,45 to 90 minutes PT Frequency: 5 out of 7 days PT Duration Estimated Length of Stay: 7-10 days OT Intensity: Minimum of 1-2 x/day, 45 to 90 minutes OT Frequency: 5 out of 7 days OT Duration/Estimated Length of Stay: 7-10 days SLP Intensity: Minumum of 1-2 x/day, 30 to 90 minutes SLP Frequency: 3 to 5 out of 7 days SLP Duration/Estimated Length of Stay: 7-10 days        Team Interventions: Nursing Interventions Patient/Family Education, Medication Management, Dysphagia/Aspiration Precaution Training, Bowel Management, Skin Care/Wound Management, Disease Management/Prevention, Cognitive  Remediation/Compensation, Discharge Planning  PT interventions Ambulation/gait training, Balance/vestibular training, Cognitive remediation/compensation, Community reintegration, Discharge planning, Disease management/prevention, DME/adaptive equipment instruction, Functional mobility training, Neuromuscular re-education, Pain management, Patient/family education, Psychosocial support, Stair training, Therapeutic Activities, Therapeutic Exercise, UE/LE Strength taining/ROM, UE/LE Coordination activities, Visual/perceptual remediation/compensation  OT Interventions Training and development officer, Patient/family education, Functional mobility training, Therapeutic Activities, Therapeutic Exercise, Discharge planning, Cognitive remediation/compensation  SLP Interventions Cognitive remediation/compensation, Cueing hierarchy, Functional tasks, Patient/family education, Internal/external aids, Speech/Language facilitation, Environmental controls, Multimodal communication approach  TR Interventions    SW/CM Interventions Discharge Planning, Psychosocial Support, Patient/Family Education    Team Discharge Planning: Destination: PT-Assisted Living (vs. ILF) ,OT- Assisted Living , SLP-Home (versus assisted living ) Projected Follow-up: PT-Home health PT, OT-  None, SLP-Home Health SLP, Outpatient SLP, Other (comment) (intermittent supervision for medications and finances ) Projected Equipment Needs: PT-None recommended by PT, OT- To be determined, SLP-None recommended by SLP Equipment Details: PT- , OT-  Patient/family involved in discharge planning: PT- Patient, Family member/caregiver,  OT-Patient, SLP-Patient, Family member/caregiver  MD ELOS: 7-11d  Medical Rehab Prognosis:  Good Assessment: 81 y.o. female with history of HTN, glaucoma, macular degeneration, BCC, abdominal mass X years who was admitted on 02/13/15 with progressive abdominal pain X 1 week and constipation. She was found to have extensive  left colonic diverticulosis with complex mas 7.6 X 7.6 x 6.3 cm , 1.4 cm liver lesion and small pelvic and retroperitoneal nodes. 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 Pathology with possibility of lung or thyroid primary. She has been evaluated by CCS and Dr. Earlie Server for input. Patient to be presented at GI conference for input and to continue on liquid diet as patient not fully obstructed. On 09/13, patient was noted to have confusion with difficulty talking. MRI brain done revealing multifocal areas of acute infarct affecting cerebellum, left paramedian pons, left thalamus, left posterior parietal and occipital regions question embolic from posterior circulation.    Now requiring 24/7 Rehab RN,MD, as well as CIR level PT, OT and SLP.  Treatment team will focus on ADLs and mobility with goals set at Mod I See Team Conference Notes for weekly updates to the plan of care

## 2015-02-25 NOTE — Progress Notes (Signed)
Physical Therapy Session Note  Patient Details  Name: [name] MRN: [id number] Date of Birth: 1925/11/10  Today's Date: 02/25/2015 PT Individual Time: 0830-0930 Treatment Session 2: 1530-1600 PT Individual Time Calculation (min): 60 min Treatment Session 2: 30 min  Short Term Goals: Week 1:  PT Short Term Goal 1 (Week 1): = LTG due to short LOS  Skilled Therapeutic Interventions/Progress Updates:    Treatment Session 1: Pt received eob sipping on coffee - reporting "they said you would help me get dressed". Therapeutic Activity: PT obtains upper and lower body dressing supplies and pt takes increased time and verbal cues for technique to don clothes - see function tab for details. Specifically, PT cues pt to cross legs and pull tight ankle legging of pants up from the bottom, not pulling from mid-calf. Pt then needs to go to the bathroom - see function tab for toileting details. After ambulating to bathroom, pt c/o low back pain. Upon further questioning, PT notes pt has chronic LBP from stenosis with associated radiculopathy, consistent with current symptoms. Therapeutic Exercise: PT instructs pt in seated lumbar flexion stretch, gently, x 10 reps - pt reports reduction in L side radiculopathy. PT then instructs pt in [facility] Test hip flexor stretch edge of mat 3 x 1 minute each side. After [employer] stretch, pt reports abolishment of lbp, L radiculopathy pain free, and R radiculopathy pain still present. Pt ended in room with all needs in reach.   Treatment Session 2: Pt received up in recliner with son present - agreeable to PT. Gait Training - PT instructs pt in ambulation from room to/from Dynavision room 150' x 2 reps req SBA and intermittent CGA without AD and cues to attend to R environment field. Therapeutic Activity - PT instructs pt in hand-eye coordination focusing on reaction time and use of hemiplegic R arm: Mode A in stand pt achieves 19 points and AVG reaction time 3.16 seconds.  Mode A endurance in stand (5 minutes) pt achieves 123 points and AVG reaction time improves to 1.95 seconds. Mode B with 1 second buttons (60 seconds) and pt is able to achieve 2 points. Pt ended sitting in recliner in room with daughter and son present. [name] reaction time improved with continued practice. Continue per PT POC.    Therapy Documentation Precautions:  Precautions Precautions: Fall Precaution Comments: Visual deficits; glaucoma, macular degeneration, blurring of vision R eye, metastatic cancer, spinal stenosis Restrictions Weight Bearing Restrictions: No Pain: Pain Assessment Pain Assessment: 0-10 Pain Score: 9  Pain Type: Chronic pain Pain Location: Back Pain Orientation: Lower Pain Descriptors / Indicators: Radiating Pain Onset: Gradual Pain Intervention(s): Other (Comment) (hip flexor stretch and lumbar flexion stretch) Multiple Pain Sites: No  Treatment Session 2: Pt denies pain.   See Function Navigator for Current Functional Status.   Therapy/Group: Individual Therapy  Shaindy Reader M 02/25/2015, 8:39 AM

## 2015-02-25 NOTE — Progress Notes (Signed)
Social Work Patient ID: Juanetta Beets, female   DOB: [birth year], 81 y.o.   MRN: [id number]   CSW met with pt and her son, [name], to update them on team conference discussion, as well as to complete assessment.  Pt and son were pleased to learn of pt's targeted d/c date of 03-02-15 and were planning for pt to go to rehab area at [facility] until pt is strong enough for surgery.  Pt is pleased with care on CIR and has already seen some progress, although she gets frustrated with her speech when she has trouble expressing herself.  Son was supportive and told her that she was tired and to just take her time, that it would probably come to her later.  He lives about 25 minutes from [place] and is supportive.  His sister lives locally and his brother is in [place].  CSW called [facility] and they will be expecting pt on 03-02-15.  CSW to send them information on pt's stay on CIR.  NO questions/concerns/needs from pt/son at this time, but CSW will continue to follow and assist at needed.

## 2015-02-26 ENCOUNTER — Inpatient Hospital Stay (HOSPITAL_COMMUNITY): Payer: Medicare Other | Admitting: Physical Therapy

## 2015-02-26 ENCOUNTER — Inpatient Hospital Stay (HOSPITAL_COMMUNITY): Payer: Medicare Other | Admitting: Speech Pathology

## 2015-02-26 ENCOUNTER — Inpatient Hospital Stay (HOSPITAL_COMMUNITY): Payer: Medicare Other

## 2015-02-26 LAB — BASIC METABOLIC PANEL
ANION GAP: 6 (ref 5–15)
BUN: 13 mg/dL (ref 6–20)
CALCIUM: 8.7 mg/dL — AB (ref 8.9–10.3)
CO2: 28 mmol/L (ref 22–32)
Chloride: 100 mmol/L — ABNORMAL LOW (ref 101–111)
Creatinine, Ser: 0.66 mg/dL (ref 0.44–1.00)
GFR calc Af Amer: >60 mL/min (ref >60)
GLUCOSE: 94 mg/dL (ref 65–99)
POTASSIUM: 4 mmol/L (ref 3.5–5.1)
Sodium: 134 mmol/L — ABNORMAL LOW (ref 135–145)

## 2015-02-26 MED ORDER — ACETAMINOPHEN 500 MG PO TABS
500.0000 mg | ORAL_TABLET | Freq: Two times a day (BID) | ORAL | Status: DC
  Administered 2015-02-27 – 2015-03-02 (×8): 500 mg via ORAL
  Filled 2015-02-26 (×8): qty 1

## 2015-02-26 NOTE — Progress Notes (Signed)
Social Work Patient ID: Katherine Cooke, female   DOB: 1925-11-20, 81 y.o.   MRN: [id number]   CSW spent clinical information to [facility] so that they can prepare for pt's transfer to their skilled unit on 03-02-15.  CSW also spoke with pt's dtr re: her concerns of her mother's emotional state and memory.  CSW will visit with pt and will ask neuropsychologist to see pt on Monday.

## 2015-02-26 NOTE — Progress Notes (Signed)
Speech Language Pathology Daily Session Note  Patient Details  Name: Katherine Cooke MRN: [id number] Date of Birth: [birth year]  Today's Date: 02/26/2015 SLP Individual Time: 0903-1000 SLP Individual Time Calculation (min): 57 min  Short Term Goals: Week 1: SLP Short Term Goal 1 (Week 1): STG=LTG due to ELOS   Skilled Therapeutic Interventions:  Pt was seen for skilled ST targeting communication goals and ongoing family education.  Upon arrival, pt was seated on the shower chair in the bathroom waiting for assistance to finish dressing herself.  SLP assisted pt in completing basic grooming tasks with supervision for safety.  [name] daughter arrived shortly thereafter and was eager to participate in therapy.  Pt ambulated to treatment room with supervision cues for safety and route finding.  SLP facilitated the session with skilled education regarding compensatory strategies for word finding emphasizing substitution and description to maximize functional communication.  SLP provided handout to facilitate carryover of strategy use in between therapy sessions.  SLP facilitated the session with a barrier task targeting use of word finding strategies.  Pt described objects, people, and places with min-mod assist semantic cues.  [name] daughter remained actively engaged in therapy session and demonstrated competence in cuing pt for use of word finding strategies.  Pt was returned to room and left in recliner with daughter present at bedside.  Continue per current plan of care.    Function:  Eating Eating                 Cognition Comprehension Comprehension assist level: Understands basic 90% of the time/cues < 10% of the time  Expression   Expression assist level: Expresses basic 75 - 89% of the time/requires cueing 10 - 24% of the time. Needs helper to occlude trach/needs to repeat words.  Social Interaction Social Interaction assist level: Interacts appropriately with others with medication or  extra time (anti-anxiety, antidepressant).  Problem Solving Problem solving assist level: Solves basic 75 - 89% of the time/requires cueing 10 - 24% of the time  Memory Memory assist level: Recognizes or recalls 50 - 74% of the time/requires cueing 25 - 49% of the time    Pain Pain Assessment Pain Assessment: No/denies pain Pain Score: 0-No pain  Therapy/Group: Individual Therapy  Page, Selinda Orion 02/26/2015, 12:31 PM

## 2015-02-26 NOTE — Progress Notes (Signed)
Physical Therapy Session Note  Patient Details  Name: Katherine Cooke MRN: [id number] Date of Birth: 05/08/1926  Today's Date: 02/26/2015 PT Individual Time: 1300-1430 PT Individual Time Calculation (min): 90 min   Short Term Goals: Week 1:  PT Short Term Goal 1 (Week 1): = LTG due to short LOS  Skilled Therapeutic Interventions/Progress Updates:    Pt received up in recliner, agreeable to PT session, but reporting fatigue. Gait Training: See function tab for details for ambulation on level surface and stairs. Neuromuscular Reeducation: PT also sets up an obstacle course consisting of: up/down a curb, stepping over walking poles, weaving around cones, picking up cones from the ground, ambulating on uneven surface (large blue foam mat), bean bag toss on blue foam reaching across midline to pick up beanbag and name item pictured on beanbag, picking up missed beanbags from target, pushing rolling tray table out of the way, and picking up walking poles - req SBA-steadying assist with 1 LOB req mod A to correct. Therapeutic Exercise: Pt c/o LBP with picking up item off the ground and so PT instructs pt in low back pain flexion based exercises in sit (gentle seated lumbar flexion) x 10 reps, progressed to [employer] stretch for hip flexors to reduce strain on low back: 3 x 1 minute each - moderate intensity. PT places pt on nu-step at L4 for B LE strengthening/muscular endurance training x 6.5 minutes. Pt very fatigued throughout PT session and req multiple, frequent rest breaks. Pt still struggles with obstacles on R due to R inattention. Continue per PT POC.   Therapy Documentation Precautions:  Precautions Precautions: Fall Precaution Comments: Visual deficits; glaucoma, macular degeneration, blurring of vision R eye, metastatic cancer, spinal stenosis Restrictions Weight Bearing Restrictions: No Pain: Pain Assessment Pain Assessment: 0-10 Pain Score: 5  Pain Type: Chronic pain Pain Location:  Abdomen Pain Descriptors / Indicators: Discomfort Pain Onset: On-going Pain Intervention(s): Distraction;Emotional support;Rest Multiple Pain Sites: No  See Function Navigator for Current Functional Status.   Therapy/Group: Individual Therapy  HYSLOP,AMANDA M 02/26/2015, 1:03 PM

## 2015-02-26 NOTE — Progress Notes (Signed)
Occupational Therapy Session Note  Patient Details  Name: Katherine Cooke MRN: [id number] Date of Birth: [birth year]  Today's Date: 02/26/2015 OT Individual Time: 1035-1140 OT Individual Time Calculation (min): 65 min    Short Term Goals: Week 1:  OT Short Term Goal 1 (Week 1): STG=LTG d/t short LOS  Skilled Therapeutic Interventions/Progress Updates: ADL-retraining at shower level with focus on endurance, functional mobility, safety awareness, memory, sequencing, and problem-solvling.  Pt performed mobility, showered standing and sitting, and dressed with min vc for sequencing and steadying assist during dynamic standing tasks.   Daughter present during session.   Pt assessed for grip/pinch weakness d/t observed difficulty using right hand during this session.   Grip = 15 lbs, bilaterally, pinch (Key pinch) = 7 lbs at left hand and 4 lbs at right.  OT educated pt on performance of 2  grip and pinch strengthening exercises using written HEP.   Daughter trained to supervise therex as pt performed exercises with OT supervising.       Therapy Documentation Precautions:  Precautions Precautions: Fall Precaution Comments: Visual deficits; glaucoma, macular degeneration, blurring of vision R eye, metastatic cancer, spinal stenosis Restrictions Weight Bearing Restrictions: No  Pain: Pain Assessment Pain Assessment: 0-10 Pain Score: 0-No pain  ADL: ADL ADL Comments: see Functional Assessment   Exercises: Hand Exercises Digit Composite Flexion: 10 reps;Strengthening;Both;Other (comment) (Soft putty (Tan))   Other Treatments:    See Function Navigator for Current Functional Status.   Therapy/Group: Individual Therapy  [place] 02/26/2015, 12:01 PM

## 2015-02-27 ENCOUNTER — Inpatient Hospital Stay (HOSPITAL_COMMUNITY): Payer: Medicare Other | Admitting: Occupational Therapy

## 2015-02-27 ENCOUNTER — Inpatient Hospital Stay (HOSPITAL_COMMUNITY): Payer: Medicare Other | Admitting: Physical Therapy

## 2015-02-27 ENCOUNTER — Inpatient Hospital Stay (HOSPITAL_COMMUNITY): Payer: Medicare Other | Admitting: Speech Pathology

## 2015-02-27 NOTE — Progress Notes (Signed)
Speech Language Pathology Daily Session Note  Patient Details  Name: Katherine Cooke MRN: [id number] Date of Birth: 01-02-1926  Today's Date: 02/27/2015 SLP Individual Time: 3754-3606 SLP Individual Time Calculation (min): 30 min  Short Term Goals: Week 1: SLP Short Term Goal 1 (Week 1): STG=LTG due to ELOS   Skilled Therapeutic Interventions: Skilled ST intervention provided with focus on communication goals. Pt participated in [place] game to target decreased word finding. Naming provided more difficult when given specific categories and letters, pt 20% accurate. When given basic categories, pt named items with 80% accuracy, min A. Verbal encouragement required  to complete tasks, as pt seemed to lose motivation with incorrect responses.    Function:  Eating Eating Eating activity did not occur: N/A               Cognition Comprehension Comprehension assist level: Understands basic 90% of the time/cues < 10% of the time  Expression   Expression assist level: Expresses basic 75 - 89% of the time/requires cueing 10 - 24% of the time. Needs helper to occlude trach/needs to repeat words.  Social Interaction Social Interaction assist level: Interacts appropriately 90% of the time - Needs monitoring or encouragement for participation or interaction.  Problem Solving Problem solving assist level: Solves basic 75 - 89% of the time/requires cueing 10 - 24% of the time  Memory Memory assist level: Recognizes or recalls 50 - 74% of the time/requires cueing 25 - 49% of the time    Pain Pain Assessment Pain Assessment: No/denies pain Pain Score: 0-No pain  Therapy/Group: Individual Therapy  McMasters, Bernerd Pho 02/27/2015, 12:30 PM

## 2015-02-27 NOTE — Progress Notes (Signed)
Subjective/Complaints: Just waking up. No distress. She does not indicate any problems. No pain ROS- limited due to aphasia Objective: Vital Signs: Blood pressure 125/50, pulse 89, temperature 98.2 F (36.8 C), temperature source Oral, resp. rate 16, height $RemoveBe'5\' 2"'xtZwOBGoo$  (1.575 m), weight 51.5 kg (113 lb 8.6 oz), SpO2 96 %. No results found. Results for orders placed or performed during the hospital encounter of 02/22/15 (from the past 72 hour(s))  Basic metabolic panel     Status: Abnormal   Collection Time: 02/26/15  6:25 AM  Result Value Ref Range   Sodium 134 (L) 135 - 145 mmol/L   Potassium 4.0 3.5 - 5.1 mmol/L   Chloride 100 (L) 101 - 111 mmol/L   CO2 28 22 - 32 mmol/L   Glucose, Bld 94 65 - 99 mg/dL   BUN 13 6 - 20 mg/dL   Creatinine, Ser 0.66 0.44 - 1.00 mg/dL   Calcium 8.7 (L) 8.9 - 10.3 mg/dL   GFR calc non Af Amer >60 >60 mL/min   GFR calc Af Amer >60 >60 mL/min    Comment: (NOTE) The eGFR has been calculated using the CKD EPI equation. This calculation has not been validated in all clinical situations. eGFR's persistently <60 mL/min signify possible Chronic Kidney Disease.    Anion gap 6 5 - 15     HEENT: left eye ptosis Cardio: RRR and no murmur Resp: CTA B/L and unlabored GI: BS positive and non tender Extremity:  Pulses positive and No Edema Skin:   Intact Neuro: Alert/Oriented, Cranial Nerve Abnormalities Left CN 3, Abnormal Sensory difficult to assess secondary to aphasia, Abnormal Motor 4/5 in BUE and BLE, Abnormal FMC Ataxic/ dec FMC, Dysarthric and Aphasic Musc/Skel:  Other no pain with UE or LE ROM Gen NAD Left hemiataxia  Assessment/Plan: 1. 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 Physiatrist is providing close team supervision and 24 hour management of active medical  problems listed below. Physiatrist and rehab team continue to assess barriers to discharge/monitor patient progress toward functional and medical goals.   FIM: Function - Bathing Position: Shower Body parts bathed by patient: Right arm, Left arm, Chest, Abdomen, Front perineal area, Buttocks, Right upper leg, Left upper leg, Right lower leg, Left lower leg Body parts bathed by helper: Back Bathing not applicable: Right arm, Left arm, Chest, Abdomen, Front perineal area, Buttocks, Right upper leg, Left upper leg, Right lower leg, Left lower leg Assist Level: Supervision or verbal cues  Function- Upper Body Dressing/Undressing What is the patient wearing?: Bra, Pull over shirt/dress Bra - Perfomed by patient: Thread/unthread left bra strap, Hook/unhook bra (pull down sports bra) Bra - Perfomed by helper: Thread/unthread right bra strap Pull over shirt/dress - Perfomed by patient: Thread/unthread right sleeve, Thread/unthread left sleeve, Put head through opening, Pull shirt over trunk Button up shirt - Perfomed by patient: Thread/unthread right sleeve, Thread/unthread left sleeve, Pull shirt around back, Button/unbutton shirt Assist Level: Touching or steadying assistance(Pt > 75%) Set up : To obtain clothing/put away Function - Lower Body Dressing/Undressing What is the patient wearing?: Underwear, Pants, Socks, Shoes Position: Wheelchair/chair at sink Underwear - Performed by patient: Thread/unthread right underwear leg, Thread/unthread left underwear leg, Pull underwear up/down Pants- Performed by patient: Thread/unthread right pants leg, Thread/unthread left pants leg, Pull pants up/down, Fasten/unfasten pants Socks - Performed by patient: Don/doff right  sock, Don/doff left sock Shoes - Performed by patient: Don/doff right shoe, Don/doff left shoe, Fasten right, Fasten left Assist Level: Supervision or verbal cues  Function - Toileting Toileting steps completed by patient: Adjust  clothing prior to toileting, Performs perineal hygiene, Adjust clothing after toileting Toileting Assistive Devices: Grab bar or rail Assist level: Supervision or verbal cues  Function - Air cabin crew transfer assistive device: Grab bar Assist level to toilet: Supervision or verbal cues Assist level from toilet: Supervision or verbal cues  Function - Chair/bed transfer Chair/bed transfer method: Ambulatory Chair/bed transfer assist level: Supervision or verbal cues Chair/bed transfer assistive device: Armrests Chair/bed transfer details: Verbal cues for precautions/safety (R side attention)  Function - Locomotion: Wheelchair Will patient use wheelchair at discharge?: No Type: Manual Max wheelchair distance: 100 Assist Level: Total assistance (Pt < 25%) Assist Level: Total assistance (Pt < 25%) Wheel 150 feet activity did not occur: N/A Function - Locomotion: Ambulation Assistive device: No device Max distance: 150' Assist level: Supervision or verbal cues Assist level: Supervision or verbal cues Assist level: Supervision or verbal cues Assist level: Supervision or verbal cues Assist level: Touching or steadying assistance (Pt > 75%)  Function - Comprehension Comprehension: Auditory Comprehension assist level: Understands basic 90% of the time/cues < 10% of the time  Function - Expression Expression: Verbal Expression assist level: Expresses basic 75 - 89% of the time/requires cueing 10 - 24% of the time. Needs helper to occlude trach/needs to repeat words.  Function - Social Interaction Social Interaction assist level: Interacts appropriately with others with medication or extra time (anti-anxiety, antidepressant).  Function - Problem Solving Problem solving assist level: Solves basic 75 - 89% of the time/requires cueing 10 - 24% of the time  Function - Memory Memory assist level: Recognizes or recalls 50 - 74% of the time/requires cueing 25 - 49% of the  time Patient normally able to recall (first 3 days only): Current season, That he or she is in a hospital  Medical Problem List and Plan: 1. 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 DVT Prophylaxis/Anticoagulation: Pharmaceutical: Lovenox to continue 3. Spinal stenosis/Pain Management: Hydrocodone constipating --continue Tylenol prn for pain. Avoid narcotics. Will add K pad.  4. Mood: LCSW to follow for evaluation and support.  5. Neuropsych: This patient is not capable of making decisions on her own behalf. 6. Skin/Wound Care: Routine pressure relief measures. Offer supplements between meals. Monitor perirectal scan given loose BMs 7. Fluids/Electrolytes/Nutrition: Monitor I/O. Continue supplements between meals. Variable meal intake still 8. HTN: Monitor BID. Avoid hypotension. By mouth intake is improving 9. Colonic lesion with near obstruction: Continue daily Miralax with second dose prn if no BM that day to prevent obstruction, no clinical signs, frequent loose bowel movements  10. Hypokalemia: Has resolved with supplement. Will d/c Kdur and monitor.repeat bmet in a.m. 11. Leucocytosis: wbc 12.4 12. Thrombocytosis: 549.  13. Iron deficiency anemia: Avoiding po iron for now due to GI symptoms.  14. Code status: Discussed--Patient and family desire Full Code.    LOS (Days) 5 A FACE TO FACE EVALUATION WAS PERFORMED  SWARTZ,ZACHARY T 02/27/2015, 9:32 AM

## 2015-02-27 NOTE — Progress Notes (Signed)
Physical Therapy Session Note  Patient Details  Name: Katherine Cooke MRN: [id number] Date of Birth: 05-Sep-1925  Today's Date: 02/27/2015 PT Individual Time: 1450-1535 PT Individual Time Calculation (min): 45 min   Short Term Goals: Week 1:  PT Short Term Goal 1 (Week 1): = LTG due to short LOS  Skilled Therapeutic Interventions/Progress Updates:    Pt received in bed, agreeable to PT, but c/o L arm pain - RN notified. Therapeutic Activity - see function tab for bed mobility, transfer, and toileting details. Pt agrees to [employer] activity for dynamic stand balance, scanning attention to R side, and use of hemiplegic R arm (along with L arm). Trial 1: Mode B, 3 seconds, 3 minutes: AVG reaction time 1.65 sec and scoring 83/97 points. Slowest quadrant: lower R. Fastest quadrant: Upper R. Trial 2: Mode B, 2 seconds, 60 seconds, grocery list on T screen - divided attention task: AVG reaction time: 1.41 and scoring 22/36 points. This divided attention activity was very difficult for this patient and she was only able to notice and say 3 of the grocery words. Trial 3: Mode A - endurance (4 min). AVG reaction time: 1.48 sec and 162 points. Slowest quadrant: lower R. Fastest Quadrant: Upper L.  Gait Training - PT instructs pt in ambulation with use of SPC 150' x 2 reps req CGA-SBA when focusing on 2 point gait pattern, but req SBA when not thinking about gait pattern and simply naturally using the cane. [name] reaction time and noticing of R side of Dynavision board is improving by objective measures. [name] expressive aphasia continues to limit her ability to localize c/o pain. Pt ended on eob with son in room. Continue per PT POC.   Therapy Documentation Precautions:  Precautions Precautions: Fall Precaution Comments: Visual deficits; glaucoma, macular degeneration, blurring of vision R eye, metastatic cancer, spinal stenosis Restrictions Weight Bearing Restrictions: No Pain: Pain Assessment Pain  Assessment: 0-10 Pain Score: 5  Pain Type: Acute pain Pain Location: Arm Pain Orientation: Left Pain Descriptors / Indicators: Sharp Pain Onset: Gradual Pain Intervention(s): RN made aware Multiple Pain Sites: No   See Function Navigator for Current Functional Status.   Therapy/Group: Individual Therapy  HYSLOP,AMANDA M 02/27/2015, 2:55 PM

## 2015-02-27 NOTE — Progress Notes (Signed)
Occupational Therapy Session Note  Patient Details  Name: Katherine Cooke MRN: [id number] Date of Birth: Sep 25, 1925  Today's Date: 02/27/2015 OT Individual Time: 1300-1400  OT Individual Time Calculation (min): 60 min    Short Term Goals: Week 1:  OT Short Term Goal 1 (Week 1): STG=LTG d/t short LOS :     Skilled Therapeutic Interventions/Progress Updates:    Pt lyig in bed.  Addressed bed mobility, functional mobility, RUE coordination, hand writing.  Pt. Ambulated to day room with RW. Unable to locate eye patch during session.   Pt wrote with RUE with name x10 which handwriting was fair.   Addressed organizing dominoes in tin box.   Need mod assist with organizational skills.   Pt. Complained of eyes burning.  Ambulated back to room and left with all needs in reach..   Therapy Documentation Precautions:  Precautions Precautions: Fall Precaution Comments: Visual deficits; glaucoma, macular degeneration, blurring of vision R eye, metastatic cancer, spinal stenosis Restrictions Weight Bearing Restrictions: No      Pain: Pain Assessment Pain Assessment: eye burning during activities Pain Score: 4/10  ADL: ADL ADL Comments: see Functional Assessment        See Function Navigator for Current Functional Status.   Therapy/Group: Individual Therapy  Lisa Roca 02/27/2015, 1:05 PM

## 2015-02-27 NOTE — Progress Notes (Signed)
Occupational Therapy Session Note  Patient Details  Name: Katherine Cooke MRN: [id number] Date of Birth: [birth year]  Today's Date: 02/27/2015 OT Individual Time: 0930-1030 OT Individual Time Calculation (min): 60 min    Short Term Goals: Week 1:  OT Short Term Goal 1 (Week 1): STG=LTG d/t short LOS  Skilled Therapeutic Interventions/Progress Updates:   Patient seen for ADL retraining this morning for shower seated on shower chair.  Patient supine in bed upon arrival of therapist.  Transfers with supervision for technique and safety stand step to wheelchair, wheelchair to/from shower seat and sit to/from stand throughout sessions.  Patient bathes with verbal cues for technique, sequencing, organization and problem solving.  Patient completes dressing wheelchair level at the sink.  Min assist to fasten bra and supervision for over head shirt.  Close SBA/supervision for LB dressing retraining.   Patient completes topographical orientation task and BUE therapeutic strengthening/activity tolerance tasks to self propel wheelchair from hospital room to/from therapy gym with increased time and verbal cues for technique secondary to heavy push from LUE with R sided veering of wheelchair.  Patient requires min assist from therapist for straight path.  Patient able to locate gym with verbal cues and only supervision when returning to hospital room.  Patient completes sit to/from stand in therapy gym with SBA/supervision.  Completes standing tolerance task with SBA stand balance and stand tolerance x 4 minutes while completing 2 handed laundry folding task.  Good tolerance for session.   Therapy Documentation Precautions:  Precautions Precautions: Fall Precaution Comments: Visual deficits; glaucoma, macular degeneration, blurring of vision R eye, metastatic cancer, spinal stenosis Restrictions Weight Bearing Restrictions: No Pain: Pain Assessment Pain Assessment: No/denies pain Pain Score: 0-No  pain ADL: ADL ADL Comments: see Functional Assessment  See Function Navigator for Current Functional Status.   Therapy/Group: Individual Therapy  Osa Craver 02/27/2015, 12:09 PM

## 2015-02-28 ENCOUNTER — Inpatient Hospital Stay (HOSPITAL_COMMUNITY): Payer: Medicare Other | Admitting: Occupational Therapy

## 2015-02-28 NOTE — Progress Notes (Signed)
Subjective/Complaints: No problems overnight. Breathing well. Slept well.  ROS- limited due to aphasia  Objective: Vital Signs: Blood pressure 140/61, pulse 91, temperature 97.9 F (36.6 C), temperature source Oral, resp. rate 18, height 5\' 2"  (1.575 m), weight 51.5 kg (113 lb 8.6 oz), SpO2 95 %. No results found. Results for orders placed or performed during the hospital encounter of 02/22/15 (from the past 72 hour(s))  Basic metabolic panel     Status: Abnormal   Collection Time: 02/26/15  6:25 AM  Result Value Ref Range   Sodium 134 (L) 135 - 145 mmol/L   Potassium 4.0 3.5 - 5.1 mmol/L   Chloride 100 (L) 101 - 111 mmol/L   CO2 28 22 - 32 mmol/L   Glucose, Bld 94 65 - 99 mg/dL   BUN 13 6 - 20 mg/dL   Creatinine, Ser 02/28/15 0.44 - 1.00 mg/dL   Calcium 8.7 (L) 8.9 - 10.3 mg/dL   GFR calc non Af Amer >60 >60 mL/min   GFR calc Af Amer >60 >60 mL/min    Comment: (NOTE) The eGFR has been calculated using the CKD EPI equation. This calculation has not been validated in all clinical situations. eGFR's persistently <60 mL/min signify possible Chronic Kidney Disease.    Anion gap 6 5 - 15     HEENT: left eye ptosis Cardio: RRR and no murmur Resp: CTA B/L and unlabored GI: BS positive and non tender Extremity:  Pulses positive and No Edema Skin:   Intact Neuro: Alert/Oriented, Cranial Nerve Abnormalities Left CN 3, Abnormal Sensory difficult to assess secondary to aphasia, Abnormal Motor 4/5 in BUE and BLE, Abnormal FMC Ataxic/ dec FMC, Dysarthric and Aphasic Musc/Skel:  Other no pain with UE or LE ROM Gen NAD Left hemiataxia  Assessment/Plan: 1. 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 Physiatrist is providing close team supervision and 24 hour management of active medical problems listed  below. Physiatrist and rehab team continue to assess barriers to discharge/monitor patient progress toward functional and medical goals.   FIM: Function - Bathing Position: Shower Body parts bathed by patient: Right arm, Left arm, Chest, Abdomen, Front perineal area, Buttocks, Back, Left lower leg, Right lower leg, Left upper leg, Right upper leg Body parts bathed by helper: Back Bathing not applicable: Right arm, Left arm, Chest, Abdomen, Front perineal area, Buttocks, Right upper leg, Left upper leg, Right lower leg, Left lower leg Assist Level: Supervision or verbal cues  Function- Upper Body Dressing/Undressing What is the patient wearing?: Bra, Pull over shirt/dress Bra - Perfomed by patient: Thread/unthread right bra strap, Thread/unthread left bra strap, Hook/unhook bra (pull down sports bra) Bra - Perfomed by helper: Hook/unhook bra (pull down sports bra) Pull over shirt/dress - Perfomed by patient: Thread/unthread right sleeve, Thread/unthread left sleeve, Put head through opening, Pull shirt over trunk Button up shirt - Perfomed by patient: Thread/unthread right sleeve, Thread/unthread left sleeve, Pull shirt around back, Button/unbutton shirt Assist Level: Touching or steadying assistance(Pt > 75%) Set up : To obtain clothing/put away Function - Lower Body Dressing/Undressing What is the patient wearing?: Underwear, Pants, Socks, Shoes Position: Wheelchair/chair at sink Underwear - Performed by patient: Thread/unthread right underwear leg, Thread/unthread left underwear leg, Pull underwear up/down Pants- Performed by patient: Thread/unthread right pants leg, Thread/unthread left pants leg, Pull pants up/down Socks - Performed by patient: Don/doff  right sock, Don/doff left sock Shoes - Performed by patient: Don/doff right shoe, Don/doff left shoe, Fasten right, Fasten left Assist Level: Touching or steadying assistance (Pt > 75%), Supervision or verbal cues  Function -  Toileting Toileting activity did not occur: N/A Toileting steps completed by patient: Adjust clothing prior to toileting, Performs perineal hygiene, Adjust clothing after toileting Toileting Assistive Devices: Grab bar or rail Assist level: Supervision or verbal cues  Function - Air cabin crew transfer activity did not occur: N/A Toilet transfer assistive device: Grab bar Assist level to toilet: Supervision or verbal cues Assist level from toilet: Supervision or verbal cues  Function - Chair/bed transfer Chair/bed transfer method: Ambulatory Chair/bed transfer assist level: Supervision or verbal cues Chair/bed transfer assistive device: Cane Chair/bed transfer details: Verbal cues for precautions/safety  Function - Locomotion: Wheelchair Will patient use wheelchair at discharge?: No Type: Manual Max wheelchair distance: 100 Assist Level: Total assistance (Pt < 25%) Assist Level: Total assistance (Pt < 25%) Wheel 150 feet activity did not occur: N/A Function - Locomotion: Ambulation Assistive device: Cane-straight Max distance: 150' x 2 reps Assist level: Touching or steadying assistance (Pt > 75%) Assist level: Supervision or verbal cues Assist level: Touching or steadying assistance (Pt > 75%) Assist level: Touching or steadying assistance (Pt > 75%) Assist level: Touching or steadying assistance (Pt > 75%)  Function - Comprehension Comprehension: Auditory Comprehension assist level: Understands basic 90% of the time/cues < 10% of the time  Function - Expression Expression: Verbal Expression assist level: Expresses basic 75 - 89% of the time/requires cueing 10 - 24% of the time. Needs helper to occlude trach/needs to repeat words.  Function - Social Interaction Social Interaction assist level: Interacts appropriately 90% of the time - Needs monitoring or encouragement for participation or interaction.  Function - Problem Solving Problem solving assist level:  Solves basic 75 - 89% of the time/requires cueing 10 - 24% of the time  Function - Memory Memory assist level: Recognizes or recalls 50 - 74% of the time/requires cueing 25 - 49% of the time Patient normally able to recall (first 3 days only): Current season, That he or she is in a hospital  Medical Problem List and Plan: 1. 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 DVT Prophylaxis/Anticoagulation: Pharmaceutical: Lovenox to continue 3. Spinal stenosis/Pain Management: Hydrocodone constipating --continue Tylenol prn for pain. Avoid narcotics. continue add K pad.  4. Mood: LCSW to follow for evaluation and support.  5. Neuropsych: This patient is not capable of making decisions on her own behalf. 6. Skin/Wound Care: Routine pressure relief measures. Offer supplements between meals. Monitor perirectal scan given loose BMs 7. Fluids/Electrolytes/Nutrition: Monitor I/O. Continue supplements between meals. Variable meal intake still 8. HTN: Monitor BID. Avoid hypotension. By mouth intake is improving 9. Colonic lesion with near obstruction: Continue daily Miralax with second dose prn if no BM that day to prevent obstruction, no clinical signs, frequent loose bowel movements  10. Hypokalemia: Has resolved with supplement. 4.0 on 9.23 11. Leucocytosis: wbc 12.4 12. Thrombocytosis: 549.  13. Iron deficiency anemia: Avoiding po iron for now due to GI symptoms.  14. Code status: Discussed--Patient and family desire Full Code.    LOS (Days) 6 A FACE TO FACE EVALUATION WAS PERFORMED  Nandi Tonnesen T 02/28/2015, 8:53 AM

## 2015-02-28 NOTE — Progress Notes (Signed)
ANTICOAGULATION CONSULT NOTE - Follow Up  Pharmacy Consult for Enoxaparin Indication: Hypercoag state due to metastatic cancer  Allergies  Allergen Reactions  . Lyrica [Pregabalin] Shortness Of Breath   Patient Measurements: Height: '5\' 2"'$  (157.5 cm) Weight: 113 lb 8.6 oz (51.5 kg) IBW/kg (Calculated) : 50.1  Vital Signs: Temp: 97.9 Cooke (36.6 C) (09/25 0442) Temp Source: Oral (09/25 0442) BP: 140/61 mmHg (09/25 0442) Pulse Rate: 91 (09/25 0442)  Labs:  Recent Labs  02/26/15 0625  CREATININE 0.66   Estimated Creatinine Clearance: 39.2 mL/min (by C-G formula based on Cr of 0.66).  Medical History: Past Medical History  Diagnosis Date  . Hypertension   . Spinal stenosis   . Basal cell carcinoma of nose 2005  . Squamous cell carcinoma of left hip 2005  . Colon cancer dx'd 02/16/15  . Diabetes mellitus without complication   . Glaucoma   . Macular degeneration    Assessment: 81 yo Cooke on Lovenox for hypercoagulable state d/t metastatic cancer with emoblic stroke. Last Hgb 10.2, plt 549 on 9/20, stable. Scr 0.66, est. CrCl 35-40 ml/min based on last labs from 9/23. Will continue '1mg'$ /kg Q 24 hrs dosing given borderline renal function and borderline weight. She is also on Aspirin 325 mg daily. No bleeding complications noted.  Goal of Therapy:  Monitor platelets by anticoagulation protocol: Yes   Plan:  Continue enoxaparin '50mg'$  [place] q24h Monitor CBC, renal fcn, s/s of bleed    Stephens November, PharmD PGY1 resident  Pager: [phone] 02/28/2015,1:17 PM

## 2015-02-28 NOTE — Progress Notes (Signed)
Occupational Therapy Session Note  Patient Details  Name: GENEAL HUEBERT MRN: [id number] Date of Birth: 1926-03-23  Today's Date: 02/28/2015 OT Individual Time: 8295-6213 OT Individual Time Calculation (min): 45 min    Short Term Goals: Week 1:  OT Short Term Goal 1 (Week 1): STG=LTG d/t short LOS Week 2:     Skilled Therapeutic Interventions/Progress Updates:    Engaged in bathing and dressing at shower level.  Pt ambulated with RW to shower.  Addressed sit to stand , standing balance, and pt leading the session.  See FX TAv  Therapy Documentation Precautions:  Precautions Precautions: Fall Precaution Comments: Visual deficits; glaucoma, macular degeneration, blurring of vision R eye, metastatic cancer, spinal stenosis Restrictions Weight Bearing Restrictions: No    Pain: none   ADL: ADL ADL Comments: see Functional Assessment   See Function Navigator for Current Functional Status.   Therapy/Group: Individual Therapy  Lisa Roca 02/28/2015, 6:36 PM

## 2015-03-01 ENCOUNTER — Inpatient Hospital Stay (HOSPITAL_COMMUNITY): Payer: Medicare Other | Admitting: Occupational Therapy

## 2015-03-01 ENCOUNTER — Encounter (HOSPITAL_COMMUNITY): Payer: Medicare Other

## 2015-03-01 ENCOUNTER — Inpatient Hospital Stay (HOSPITAL_COMMUNITY): Payer: Medicare Other | Admitting: Speech Pathology

## 2015-03-01 ENCOUNTER — Inpatient Hospital Stay (HOSPITAL_COMMUNITY): Payer: Medicare Other | Admitting: Physical Therapy

## 2015-03-01 ENCOUNTER — Inpatient Hospital Stay (HOSPITAL_COMMUNITY): Payer: Medicare Other

## 2015-03-01 DIAGNOSIS — C189 Malignant neoplasm of colon, unspecified: Secondary | ICD-10-CM | POA: Diagnosis present

## 2015-03-01 LAB — CREATININE, SERUM
Creatinine, Ser: 0.68 mg/dL (ref 0.44–1.00)
GFR calc non Af Amer: >60 mL/min (ref >60)

## 2015-03-01 MED ORDER — FLUTICASONE PROPIONATE 50 MCG/ACT NA SUSP: 1.0000 | Freq: Every day | NASAL | Status: AC

## 2015-03-01 MED ORDER — DOCUSATE SODIUM 100 MG PO CAPS: 200.0000 mg | ORAL_CAPSULE | Freq: Every day | ORAL | Status: AC

## 2015-03-01 MED ORDER — FLEET ENEMA 7-19 GM/118ML RE ENEM: 1.0000 | ENEMA | Freq: Once | RECTAL | Status: DC | PRN

## 2015-03-01 MED ORDER — ONDANSETRON HCL 4 MG PO TABS: 4.0000 mg | ORAL_TABLET | Freq: Four times a day (QID) | ORAL | Status: AC | PRN

## 2015-03-01 MED ORDER — ACETAMINOPHEN 500 MG PO TABS: 500.0000 mg | ORAL_TABLET | Freq: Two times a day (BID) | ORAL | Status: AC

## 2015-03-01 MED ORDER — POLYETHYLENE GLYCOL 3350 17 G PO PACK: 17.0000 g | PACK | Freq: Every day | ORAL | Status: AC

## 2015-03-01 MED ORDER — POLYETHYLENE GLYCOL 3350 17 G PO PACK: 17.0000 g | PACK | Freq: Every day | ORAL | Status: DC | PRN

## 2015-03-01 MED ORDER — SACCHAROMYCES BOULARDII 250 MG PO CAPS: 250.0000 mg | ORAL_CAPSULE | Freq: Two times a day (BID) | ORAL | Status: AC

## 2015-03-01 MED ORDER — ENOXAPARIN SODIUM 100 MG/ML ~~LOC~~ SOLN: 50.0000 mg | SUBCUTANEOUS | Status: DC

## 2015-03-01 NOTE — Progress Notes (Signed)
Occupational Therapy Session Note  Patient Details  Name: NANI INGRAM MRN: [id number] Date of Birth: July 15, 1925  Today's Date: 03/01/2015 OT Individual Time: 1500-1531 OT Individual Time Calculation (min): 31 min    Skilled Therapeutic Interventions/Progress Updates:    Pt ambulated around the rehab center with use of the cane and min assist.  Pt unable to use the cane correctly during mobility and at times would take 2 steps before using it.  Max demonstrational cueing to use correctly with visual and verbal instruction but pt unable to carryover.  Min guard assist for balance during mobility.  Pt given 3 words to remember while resting in sitting.  After 3-5 min delay pt only able to recall 1/3 independently.  Needed min instructional cueing for the second word and max assist to state the third.  When given 3 items to find using the external cues in the kitchen she was unable to remember more than 1 after 1-2 minute delay.  Returned to room at end of session and discussed session with son who was present.  Pt left in wheelchair with son present to provide supervision.  Instructed him to let staff know if he was leaving soon to assist pt back to bed.     Therapy Documentation Precautions:  Precautions Precautions: Fall Precaution Comments: Visual deficits; glaucoma, macular degeneration, blurring of vision R eye, metastatic cancer, spinal stenosis Restrictions Weight Bearing Restrictions: No  Pain: Pain Assessment Pain Assessment: No/denies pain Faces Pain Scale: Hurts a little bit Pain Location: Leg Pain Orientation: Right Pain Intervention(s): Emotional support ADL:  See Function Navigator for Current Functional Status.   Therapy/Group: Individual Therapy  MCGUIRE,JAMES OTR/L 03/01/2015, 3:51 PM

## 2015-03-01 NOTE — Progress Notes (Signed)
Occupational Therapy Session Note  Patient Details  Name: Katherine Cooke MRN: [id number] Date of Birth: [birth year]  Today's Date: 03/01/2015 OT Individual Time: 1100-1200 OT Individual Time Calculation (min): 60 min    Short Term Goals: Week 1:  OT Short Term Goal 1 (Week 1): STG=LTG d/t short LOS  Skilled Therapeutic Interventions/Progress Updates:  Therapeutic activity with focus on improved problem-solving, memory, orientation and dynamic standing balance.   Pt received in transition from her room, 4W25 to new room 4W05.   Pt was challenged to locate new dresser, bedside table  and closet then sort and deposit her personal items appropriately.   Pt was provided cart full of her items and instructed to request assist, as needed.   While sorting, pt required min intermittent vc to name object and to determine where it should be placed in her room.   Pt required 1 rest break to toilet, completing medium BM with min assist for clothing management d/t soiled underwear.   Pt returned to w/c after toileting and required setup to open containers.     Therapy Documentation Precautions:  Precautions Precautions: Fall Precaution Comments: Visual deficits; glaucoma, macular degeneration, blurring of vision R eye, metastatic cancer, spinal stenosis Restrictions Weight Bearing Restrictions: No   Pain: Pain Assessment Pain Assessment: No/denies pain  ADL: ADL ADL Comments: see Functional Assessment  See Function Navigator for Current Functional Status.   Therapy/Group: Individual Therapy  [place] 03/01/2015, 12:31 PM

## 2015-03-01 NOTE — Progress Notes (Signed)
Speech Language Pathology Discharge Summary  Patient Details  Name: Katherine Cooke MRN: [id number] Date of Birth: 12-20-1925  Today's Date: 03/01/2015 SLP Individual Time: 6295-2841 SLP Individual Time Calculation (min): 60 min   Skilled Therapeutic Interventions:  Pt was seen for skilled ST targeting cognitive-linguistic goals.  Upon arrival, pt was seated upright in recliner awake, alert, and agreeable to participate in ST.  SLP facilitated the session with a sequencing task targeting verbal expression.  Pt sequenced picture cards for 100% accuracy with mod I; but required min assist verbal cues to name familiar objects in pictures.  Pt was aware of errors and was able to correct paraphasias with extra time in ~80% of opportunities.  SLP also facilitated the session with a basic money management task targeting functional problem solving for home management.  Pt was able to sort coins into groups by value with extra time to compensate for visual changes.  Pt required min assist verbal cues when counting money and making change due to working memory and visual impairments.  Pt was left in recliner with call bell within reach.  All questions were answered to her satisfaction at this time.      Patient has met 1 of 3 long term goals.  Patient to discharge at overall Min;Supervision level.  Reasons goals not met: pt is progressing towards meeting long term goals but continues to require min assist-supervision for use of compensatory aids for memory and word finding, mentation and communication noted to fluctuate with fatigue    Clinical Impression/Discharge Summary:  Pt made slow, functional gains while inpatient and is discharging having met 1 out of 3 long term goals.  Pt continues to demonstrate progress towards goals but requires min assist-supervision to verbally express her needs and/or wants to caregivers due to expressive aphasia characterized by perseveration as well as semantic and phonemic  paraphasic errors.  Pt can usually correct verbal errors with extra time; however, her mentation and communication is greatly impacted by fatigue.  She requires up to min assist for basic, familiar cognitive tasks due to decreased working memory and visuoperceptual deficits.  She is currently consuming dys 3 textures and thin liquids per MD due to history of GI issues but may be advanced as tolerated.  Pt is discharging to SNF where it is recommended that she receive follow up ST to maximize functional independence and reduce burden of care prior to eventual discharge back to independent living facility.  Pt and family education is complete at this time.    Care Partner:  Caregiver Able to Provide Assistance: Other (comment) (SNF )  Type of Caregiver Assistance:  (SNF )  Recommendation:  Skilled Nursing facility  Rationale for SLP Follow Up: Maximize functional communication;Maximize cognitive function and independence;Maximize swallowing safety;Reduce caregiver burden   Equipment: none recommended by SLP    Reasons for discharge: Discharged from hospital   Patient/Family Agrees with Progress Made and Goals Achieved: Yes   Function:  Eating Eating     Eating Assist Level: Set up assist for   Eating Set Up Assist For: Opening containers       Cognition Comprehension Comprehension assist level: Follows basic conversation/direction with extra time/assistive device  Expression   Expression assist level: Expresses basic 75 - 89% of the time/requires cueing 10 - 24% of the time. Needs helper to occlude trach/needs to repeat words.  Social Interaction Social Interaction assist level: Interacts appropriately 90% of the time - Needs monitoring or encouragement for participation or interaction.  Problem Solving Problem solving assist level: Solves basic 75 - 89% of the time/requires cueing 10 - 24% of the time  Memory Memory assist level: Recognizes or recalls 50 - 74% of the time/requires  cueing 25 - 49% of the time   Windell Moulding L 03/01/2015, 1:04 PM

## 2015-03-01 NOTE — Progress Notes (Signed)
Subjective/Complaints: Left eye feels blurry/funny (aphasia limits expression) No hx of eye surgery , started at time of CVA ROS- limited due to aphasia  Objective: Vital Signs: Blood pressure 142/55, pulse 83, temperature 97.8 F (36.6 C), temperature source Oral, resp. rate 16, height $RemoveBe'5\' 2"'gFknYqaGF$  (1.575 m), weight 51.5 kg (113 lb 8.6 oz), SpO2 95 %. No results found. Results for orders placed or performed during the hospital encounter of 02/22/15 (from the past 72 hour(s))  Creatinine, serum     Status: None   Collection Time: 03/01/15  4:35 AM  Result Value Ref Range   Creatinine, Ser 0.68 0.44 - 1.00 mg/dL   GFR calc non Af Amer >60 >60 mL/min   GFR calc Af Amer >60 >60 mL/min    Comment: (NOTE) The eGFR has been calculated using the CKD EPI equation. This calculation has not been validated in all clinical situations. eGFR's persistently <60 mL/min signify possible Chronic Kidney Disease.      HEENT: left eye ptosis Cardio: RRR and no murmur Resp: CTA B/L and unlabored GI: BS positive and non tender Extremity:  Pulses positive and No Edema Skin:   Intact Neuro: Alert/Oriented, Cranial Nerve Abnormalities Left CN 3, Abnormal Sensory difficult to assess secondary to aphasia, Abnormal Motor 4/5 in BUE and BLE, Abnormal FMC Ataxic/ dec FMC, Dysarthric and Aphasic Musc/Skel:  Other no pain with UE or LE ROM Gen NAD Left hemiataxia  Assessment/Plan: 1. 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 Physiatrist is providing close team supervision and 24 hour management of active medical problems listed below. Physiatrist and rehab team continue to assess barriers to discharge/monitor patient progress toward functional and medical goals.  Anticipate D/C to SNF in am Gen Surgery f/u as  outpt FIM: Function - Bathing Position: Shower Body parts bathed by patient: Right arm, Left arm, Chest, Abdomen, Front perineal area, Buttocks, Back, Left lower leg, Right lower leg, Left upper leg, Right upper leg Body parts bathed by helper: Back Bathing not applicable: Right arm, Left arm, Chest, Abdomen, Front perineal area, Buttocks, Right upper leg, Left upper leg, Right lower leg, Left lower leg Assist Level: Supervision or verbal cues  Function- Upper Body Dressing/Undressing What is the patient wearing?: Bra, Pull over shirt/dress Bra - Perfomed by patient: Thread/unthread right bra strap, Thread/unthread left bra strap, Hook/unhook bra (pull down sports bra) Bra - Perfomed by helper: Hook/unhook bra (pull down sports bra) Pull over shirt/dress - Perfomed by patient: Thread/unthread right sleeve, Thread/unthread left sleeve, Put head through opening, Pull shirt over trunk Button up shirt - Perfomed by patient: Thread/unthread right sleeve, Thread/unthread left sleeve, Pull shirt around back, Button/unbutton shirt Assist Level: Touching or steadying assistance(Pt > 75%) Set up : To obtain clothing/put away Function - Lower Body Dressing/Undressing What is the patient wearing?: Underwear, Pants, Socks, Shoes Position: Wheelchair/chair at sink Underwear - Performed by patient: Thread/unthread right underwear leg, Thread/unthread left underwear leg, Pull underwear up/down Underwear - Performed by helper: Thread/unthread right underwear leg, Thread/unthread left underwear leg, Pull underwear up/down Pants- Performed by patient: Thread/unthread right pants leg, Thread/unthread left pants leg, Pull pants up/down Socks - Performed by patient: Don/doff right sock, Don/doff left sock Shoes - Performed by patient: Don/doff right shoe, Don/doff left shoe, Fasten right, Fasten left Shoes - Performed by helper: Don/doff right shoe, Don/doff left shoe, Fasten right,  Fasten left Assist Level:  Touching or steadying assistance (Pt > 75%), Supervision or verbal cues  Function - Toileting Toileting activity did not occur: N/A Toileting steps completed by patient: Adjust clothing prior to toileting, Performs perineal hygiene, Adjust clothing after toileting Toileting Assistive Devices: Grab bar or rail Assist level: Supervision or verbal cues  Function - Air cabin crew transfer activity did not occur: N/A Toilet transfer assistive device: Grab bar Assist level to toilet: Supervision or verbal cues Assist level from toilet: Supervision or verbal cues  Function - Chair/bed transfer Chair/bed transfer method: Ambulatory Chair/bed transfer assist level: Supervision or verbal cues Chair/bed transfer assistive device: Cane Chair/bed transfer details: Verbal cues for precautions/safety  Function - Locomotion: Wheelchair Will patient use wheelchair at discharge?: No Type: Manual Max wheelchair distance: 100 Assist Level: Total assistance (Pt < 25%) Assist Level: Total assistance (Pt < 25%) Wheel 150 feet activity did not occur: N/A Function - Locomotion: Ambulation Assistive device: Cane-straight Max distance: 150' x 2 reps Assist level: Touching or steadying assistance (Pt > 75%) Assist level: Supervision or verbal cues Assist level: Touching or steadying assistance (Pt > 75%) Assist level: Touching or steadying assistance (Pt > 75%) Assist level: Touching or steadying assistance (Pt > 75%)  Function - Comprehension Comprehension: Auditory Comprehension assist level: Understands basic 90% of the time/cues < 10% of the time  Function - Expression Expression: Verbal Expression assist level: Expresses basic 75 - 89% of the time/requires cueing 10 - 24% of the time. Needs helper to occlude trach/needs to repeat words.  Function - Social Interaction Social Interaction assist level: Interacts appropriately 90% of the time - Needs monitoring or encouragement for  participation or interaction.  Function - Problem Solving Problem solving assist level: Solves basic 75 - 89% of the time/requires cueing 10 - 24% of the time  Function - Memory Memory assist level: Recognizes or recalls 50 - 74% of the time/requires cueing 25 - 49% of the time Patient normally able to recall (first 3 days only): Current season, That he or she is in a hospital  Medical Problem List and Plan: 1. 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 DVT Prophylaxis/Anticoagulation: Pharmaceutical: Lovenox to continue 3. Spinal stenosis/Pain Management: Hydrocodone constipating --continue Tylenol prn for pain. Avoid narcotics. continue add K pad.  4. Mood: LCSW to follow for evaluation and support.  5. Neuropsych: This patient is not capable of making decisions on her own behalf. 6. Skin/Wound Care: Routine pressure relief measures. Offer supplements between meals. Monitor perirectal scan given loose BMs 7. Fluids/Electrolytes/Nutrition: Monitor I/O. Continue supplements between meals. Variable meal intake still 8. HTN: Monitor BID. Avoid hypotension. By mouth intake is improving 9. Colonic lesion with near obstruction: Continue daily Miralax with second dose prn if no BM that day to prevent obstruction, no clinical signs, frequent loose bowel movements  10. Hypokalemia: Has resolved with supplement. 4.0 on 9.23 11. Leucocytosis: wbc 12.4 12. Thrombocytosis: 549.  13. Iron deficiency anemia: Avoiding po iron for now due to GI symptoms.  14. Code status: Discussed--Patient and family desire Full Code.    LOS (Days) 7 A FACE TO FACE EVALUATION WAS PERFORMED  KIRSTEINS,ANDREW E 03/01/2015, 9:15 AM

## 2015-03-01 NOTE — Discharge Summary (Signed)
Physician Discharge Summary  Patient ID: Katherine Cooke MRN: [id number] DOB/AGE: 1925-08-16 81 y.o.  Admit date: 02/22/2015 Discharge date: 03/02/2015  Discharge Diagnoses:  Principal Problem:   Embolic stroke involving cerebellar artery Active Problems:   Hypertension   Colonic mass with partial obstruction   Leukocytosis   Anemia of chronic disease   Malignancy   Ataxia   Adenocarcinoma of colon   Discharged Condition: stable.   Significant Diagnostic Studies:  Dg Chest 2 View  02/22/2015   CLINICAL DATA:  Generalized weakness. Stroke several days ago. No chest pain or shortness of breath. History of hypertension.  EXAM: CHEST  2 VIEW  COMPARISON:  02/19/2015, 02/17/2015  FINDINGS: Heart is mildly enlarged. There is perihilar peribronchial thickening. There are no focal consolidations or effusions. No pulmonary edema.  IMPRESSION: 1. Mild cardiomegaly.  No pulmonary edema. 2. Bronchitic changes.  No focal acute pulmonary abnormality.   Electronically Signed   By: Nolon Nations M.D.   On: 02/22/2015 16:10   Dg Abd 1 View  02/22/2015   CLINICAL DATA:  Generalized weakness, stroke several days ago, no abdominal pain.  EXAM: ABDOMEN - 1 VIEW  COMPARISON:  Abdominal plain film dated 10/13/2014 and abdominal CT dated 02/13/2015.  FINDINGS: Bowel gas pattern is nonobstructive. No evidence of soft tissue mass or abnormal fluid collection seen, although mass versus abscess was described in the region of the sigmoid colon on earlier CT. No free intraperitoneal air seen.  Degenerative changes noted within the scoliotic thoracolumbar spine. No acute - appearing osseous abnormality. Scarring versus atelectasis noted at each lung base, partially obscured on the right by an elevated hemidiaphragm.  IMPRESSION: Nonobstructive bowel gas pattern and no evidence of acute intra-abdominal or intrapelvic abnormality.  Please note that earlier CT of 02/13/2015 showed a colonic malignancy versus abscess in the  left pelvis.   Electronically Signed   By: Franki Cabot M.D.   On: 02/22/2015 16:13     Labs:  Basic Metabolic Panel:  Recent Labs Lab 02/23/15 0404 02/26/15 0625 03/01/15 0435  NA 135 134*  --   K 4.9 4.0  --   CL 99* 100*  --   CO2 27 28  --   GLUCOSE 99 94  --   BUN 14 13  --   CREATININE 0.70 0.66 0.68  CALCIUM 9.2 8.7*  --     CBC: CBC Latest Ref Rng 02/23/2015 02/22/2015 02/20/2015  WBC 4.0 - 10.5 K/uL 12.4(H) 14.6(H) 12.6(H)  Hemoglobin 12.0 - 15.0 g/dL 10.2(L) 10.5(L) 10.6(L)  Hematocrit 36.0 - 46.0 % 30.6(L) 31.6(L) 30.9(L)  Platelets 150 - 400 K/uL 549(H) 506(H) 405(H)    CBG: No results for input(s): GLUCAP in the last 168 hours.   Brief HPI:   Katherine Cooke is a 81 y.o. female with history of HTN, glaucoma, macular degeneration, BCC, abdominal mass X years who was admitted on 02/13/15 with progressive abdominal pain X 1 week and constipation. She was found to have extensive left colonic diverticulosis with complex mas 7.6 X 7.6 x 6.3 cm , 1.4 cm liver lesion and small pelvic and retroperitoneal nodes. She underwent sigmoidoscopy with biopsy positive for poorly differentiated adenocarcinoma which was likely arising from outside the colon.  On 09/13, patient was noted to have confusion with difficulty talking and MRI brain done revealing multifocal areas of acute infarct affecting right erebellum, left paramedian pons, left thalamus, left posterior parietal and occipital regions. Therapeutic dose of Lovenox was added due to hypercoagulable state  and neurology recommends holding off on urgent surgical procedure for 2 weeks and elective procedure for 6 months. Patient placed on ASA 937 mg for embolic stroke likely due to hypercoagulopathy from advanced malignancy and to avoid hypotension.  Dr. Marin Olp questioned ovarian primary and recommends surgery for resection of tumor to avoid complete obstruction. Diet was advanced to dysphagia 3 and constipation had resolved.   Patient with resultant deficits in balance and vision, expressive aphasia and CIR was recommended for follow up therapy.      Hospital Course: Katherine Cooke was admitted to rehab 02/22/2015 for inpatient therapies to consist of PT, ST and OT at least three hours five days a week. Past admission physiatrist, therapy team and rehab RN have worked together to provide customized collaborative inpatient rehab. She was maintained on ASA and lovenox during her rehab stay and CBC done revealing leucocytosis and H/H has been reasonably stable. UA/UCS was negative and no signs of infection noted. Diplopia has been managed with patching of right eye.  Po intake continues to be variable and nutritional supplements were offered between meals.  Miralax has been used daily with positive results. Chronic back pain has been managed with scheduled tylenol as well as K pad for local measures.  Blood pressures have been monitored on bid basis and have been relatively controlled. Mood has been stable and she has made steady progress during her rehab stay.   During patient's stay in rehab weekly team conferences were held to monitor patient's progress, set goals and discuss barriers to discharge. At admission, patient required min assist with self care tasks and mobility. She had mild to moderate word finding deficits with semantic errors as well as persveration. She required moderate assist for recall and to self monitor for errors. She has had improvement in activity tolerance, balance, postural control, as well as ability to compensate for deficits.   She is able to complete ADL tasks with supervision to steady assist. She continues to have narrow base of support with tendency for LOB to the right. She requires supervision with cues for safety with  Mobility. She is ambulating 250' supervision/cane. She is able to express wants and need with min assist to supervision.  She is able to correct verbal errors with extra time but  fatigue greatly impacts her communication efforts. She requires min assist for basic and familiar tasks due to deficits in working memory and visuoperceptual deficits.      Disposition:  Skilled Nursing Facility  Diet: Dysphagia 3, thin liquids.   Special Instructions: 1. Monitor and adjust bowel program to avoid colon obstruction.  2.  Offer supplements between meals.  3.  Continue to patch right eye to help with diplopia.  4. Recheck CBC and BMET in 48 hours.     Medication List    STOP taking these medications        bisacodyl 5 MG EC tablet  Commonly known as:  DULCOLAX     diclofenac sodium 1 % Gel  Commonly known as:  VOLTAREN     enoxaparin 150 MG/ML injection  Commonly known as:  LOVENOX  Replaced by:  enoxaparin 100 MG/ML injection     FISH OIL PO     ibandronate 150 MG tablet  Commonly known as:  BONIVA     MAGNESIUM PO     ROLAIDS PO     VITAMIN C PO      TAKE these medications        acetaminophen 500  MG tablet  Commonly known as:  TYLENOL  Take 1 tablet (500 mg total) by mouth 2 (two) times daily with breakfast and lunch.     aspirin 325 MG tablet  Take 1 tablet (325 mg total) by mouth daily.     cycloSPORINE 0.05 % ophthalmic emulsion  Commonly known as:  RESTASIS  Place 1 drop into both eyes 2 (two) times daily.     diphenhydrAMINE 12.5 MG/5ML elixir  Commonly known as:  BENADRYL  Take 5 mLs (12.5 mg total) by mouth every 8 (eight) hours as needed for itching.     docusate sodium 100 MG capsule  Commonly known as:  COLACE  Take 2 capsules (200 mg total) by mouth at bedtime.     enoxaparin 100 MG/ML injection  Commonly known as:  LOVENOX  Inject 0.5 mLs (50 mg total) into the skin daily.     EYE VITAMINS PO  Take by mouth.     feeding supplement (ENSURE ENLIVE) Liqd  Take 237 mLs by mouth 2 (two) times daily between meals.     fluticasone 50 MCG/ACT nasal spray  Commonly known as:  FLONASE  Place 1 spray into both nostrils daily.      montelukast 10 MG tablet  Commonly known as:  SINGULAIR  Take 10 mg by mouth at bedtime.     ondansetron 4 MG tablet  Commonly known as:  ZOFRAN  Take 1 tablet (4 mg total) by mouth every 6 (six) hours as needed for nausea.     polyethylene glycol packet  Commonly known as:  MIRALAX / GLYCOLAX  Take 17 g by mouth daily.     polyethylene glycol packet  Commonly known as:  MIRALAX / GLYCOLAX  Take 17 g by mouth daily as needed for mild constipation (Repeat at supper if bowels have not moved earlier in the day).     polyvinyl alcohol 1.4 % ophthalmic solution  Commonly known as:  LIQUIFILM TEARS  Place 1 drop into both eyes as needed for dry eyes.     saccharomyces boulardii 250 MG capsule  Commonly known as:  FLORASTOR  Take 1 capsule (250 mg total) by mouth 2 (two) times daily.     sodium phosphate 7-19 GM/118ML Enem  Place 133 mLs (1 enema total) rectally once as needed for severe constipation.       Follow-up Information    Follow up with Charlett Blake, MD On 04/02/2015.   Specialty:  Physical Medicine and Rehabilitation   Why:  Be there at  1 pm   for 1:15 pm   appointment    Contact information:   [place] [address] [phone]       Follow up with Pedro Earls, MD. Call on 03/12/2015.   Specialty:  General Surgery   Why:  Be there at 11:15 for 11:30 am   Contact information:   [place] [address] [phone]       Call Eilleen Kempf., MD.   Specialty:  Oncology   Why:  for follow up appointment.   Contact information:   [place] [address] [phone]       Follow up with Xu,Jindong, MD. Call today.   Specialty:  Neurology   Why:  for follow up appointment in 3-4 weeks.    Contact information:   [address] [place] [address] [phone]       Call  Cleotis Nipper, MD.   Specialty:  Gastroenterology   Why:  As needed   Contact  information:   [address] [place] [address] [phone]       Signed: Bary Leriche 03/02/2015, 8:52 AM

## 2015-03-01 NOTE — Plan of Care (Signed)
Problem: RH Expression Communication Goal: LTG Patient will increase word finding of common (SLP) LTG: Patient will increase word finding of common objects/daily info/abstract thoughts with cues using compensatory strategies (SLP).  Outcome: Progressing Pt needs min assist-supervision for word finding depending on fatigue  Problem: RH Memory Goal: LTG Patient will use memory compensatory aids to (SLP) LTG: Patient will use memory compensatory aids to recall biographical/new, daily complex information with cues (SLP)  Outcome: Progressing Pt requires min assist for use of memory compensatory aids

## 2015-03-01 NOTE — Progress Notes (Signed)
Physical Therapy Discharge Summary  Patient Details  Name: Katherine Cooke MRN: [id number] Date of Birth: December 04, 1925  Today's Date: 03/01/2015 PT Individual Time: 1400-1500 PT Individual Time Calculation (min): 60 min    Patient has met 5 of 10 long term goals due to improved activity tolerance, improved balance, improved postural control, increased strength, improved attention and improved awareness.  Patient to discharge at an ambulatory level Supervision.   Pt will d/c to SNF to provide the necessary physical assistance at discharge.  Reasons goals not met: Pt plan to d/c to SNF pending surgical intervention for colon mass.  Pt continues to require supervision for most mobility secondary to decreased safety awareness and balance impairments.    Recommendation:  Patient will benefit from ongoing skilled PT services in skilled nursing facility setting to continue to advance safe functional mobility, address ongoing impairments in balance, coordination, endurance, and strength, and minimize fall risk.  Equipment: No equipment provided  Reasons for discharge: D/C to SNF  Patient/family agrees with progress made and goals achieved: Yes   Skilled Therapeutic Intervention: Pt received in w/c and agreeable to therapy.  Pt planning to d/c to [facility] tomorrow for continued rehab pending abdominal surgery.  Session focused on reassessment of bed mobility, functional transfers, car transfers, gait, stair negotiation, strength, and balance.  Pt continues to demonstrate deficits in balance and coordination which affect her safety with ambulation and transfers.  Pt returned to room at end of session, hand off to OT at that time.    PT Discharge Precautions/Restrictions Precautions Precautions: Fall Precaution Comments: Visual deficits; glaucoma, macular degeneration, blurring of vision R eye, metastatic cancer, spinal stenosis Restrictions Weight Bearing Restrictions: No Pain Pain  Assessment Pain Assessment: Faces Faces Pain Scale: Hurts a little bit Pain Location: Leg Pain Orientation: Right Pain Intervention(s): Emotional support  Cognition Overall Cognitive Status: Impaired/Different from baseline Arousal/Alertness: Awake/alert Orientation Level: Oriented X4 Attention: Selective;Focused;Sustained Focused Attention: Appears intact Sustained Attention: Appears intact Selective Attention: Appears intact Memory: Impaired Memory Impairment: Decreased short term memory Decreased Short Term Memory: Verbal complex Awareness: Impaired Awareness Impairment: Emergent impairment Problem Solving: Impaired Problem Solving Impairment: Verbal complex;Functional complex Executive Function: Self Monitoring;Self Correcting Self Monitoring: Appears intact Self Correcting: Impaired Self Correcting Impairment: Verbal complex;Functional complex Safety/Judgment: Appears intact Sensation Sensation Light Touch: Appears Intact Coordination Gross Motor Movements are Fluid and Coordinated: No (decreased fluidity of movement) Fine Motor Movements are Fluid and Coordinated: Yes Finger Nose Finger Test: decreased speed but accuracy [facility] Motor  Motor Motor - Discharge Observations: strength approaching equal RLE and LLE  Mobility Bed Mobility Bed Mobility: Sit to Supine;Supine to Sit Supine to Sit: 7: Independent Sit to Supine: 7: Independent Transfers Transfers: Yes Sit to Stand: 5: Supervision Locomotion  Ambulation Ambulation: Yes Ambulation/Gait Assistance: 5: Supervision Ambulation Distance (Feet): 250 Feet Assistive device: Straight cane Gait Gait: Yes Gait Pattern: Impaired Gait Pattern: Step-through pattern;Narrow base of support (weaving, LOB to R) Stairs / Additional Locomotion Stairs: Yes Stairs Assistance: 5: Supervision Stair Management Technique: Two rails;Alternating pattern;Forwards Number of Stairs: 12 Wheelchair Mobility Wheelchair Mobility: No   Trunk/Postural Assessment  Cervical Assessment Cervical Assessment: Within Functional Limits Thoracic Assessment Thoracic Assessment: Within Functional Limits Lumbar Assessment Lumbar Assessment: Exceptions to [facility] (spinal stenosis with radiculopathy)  Balance Balance Balance Assessed: Yes Standardized Balance Assessment Standardized Balance Assessment: Berg Balance Test Berg Balance Test Sit to Stand: Able to stand without using hands and stabilize independently Standing Unsupported: Able to stand safely 2 minutes Sitting with  Back Unsupported but Feet Supported on Floor or Stool: Able to sit safely and securely 2 minutes Stand to Sit: Sits safely with minimal use of hands Transfers: Able to transfer safely, definite need of hands Standing Unsupported with Eyes Closed: Able to stand 10 seconds safely Standing Ubsupported with Feet Together: Able to place feet together independently and stand 1 minute safely From Standing, Reach Forward with Outstretched Arm: Can reach confidently >25 cm (10") From Standing Position, Pick up Object from Floor: Able to pick up shoe safely and easily From Standing Position, Turn to Look Behind Over each Shoulder: Looks behind from both sides and weight shifts well Turn 360 Degrees: Able to turn 360 degrees safely one side only in 4 seconds or less Standing Unsupported, Alternately Place Feet on Step/Stool: Able to stand independently and complete 8 steps >20 seconds Standing Unsupported, One Foot in Front: Able to take small step independently and hold 30 seconds Standing on One Leg: Unable to try or needs assist to prevent fall Total Score: 47 Static Sitting Balance Static Sitting - Level of Assistance: 7: Independent Dynamic Sitting Balance Dynamic Sitting - Level of Assistance: 6: Modified independent (Device/Increase time) Dynamic Sitting - Balance Activities: Contractor Standing Balance Static Standing - Level of Assistance: 6: Modified  independent (Device/Increase time) Dynamic Standing Balance Dynamic Standing - Balance Support: No upper extremity supported Dynamic Standing - Level of Assistance: 6: Modified independent (Device/Increase time) Dynamic Standing - Balance Activities: Ball toss Dynamic Standing - Comments: playing basketball, retrieving ball from floor Extremity Assessment      RLE Strength RLE Overall Strength Comments: hip flexion 4/5, knee flexion 4+/5, knee extension 5/5, DF 4+/5, PF 4+/5 LLE Assessment LLE Assessment: Within Functional Limits   See Function Navigator for Current Functional Status.  Caitlin E Penven-Crew 03/01/2015, 2:59 PM

## 2015-03-01 NOTE — Plan of Care (Signed)
Problem: RH Bed to Chair Transfers Goal: LTG Patient will perform bed/chair transfers w/assist (PT) LTG: Patient will perform bed/chair transfers with assistance, with/without cues (PT).  Outcome: Not Met (add Reason) Continues to require supervision for safety awareness  Problem: RH Car Transfers Goal: LTG Patient will perform car transfers with assist (PT) LTG: Patient will perform car transfers with assistance (PT).  Outcome: Not Met (add Reason) Continues to require supervision for safety awareness and sequencing   Problem: RH Furniture Transfers Goal: LTG Patient will perform furniture transfers w/assist (OT/PT LTG: Patient will perform furniture transfers with assistance (OT/PT).  Outcome: Not Met (add Reason) Continues to require supervision for safe sequencing  Problem: RH Ambulation Goal: LTG Patient will ambulate in controlled environment (PT) LTG: Patient will ambulate in a controlled environment, # of feet with assistance (PT).  Outcome: Not Met (add Reason) Pt continues to require supervision and cues for use of cane when ambulating Goal: LTG Patient will ambulate in home environment (PT) LTG: Patient will ambulate in home environment, # of feet with assistance (PT).  Outcome: Not Met (add Reason) Pt continues to require cues for sequencing when amb with SPC

## 2015-03-01 NOTE — Discharge Instructions (Signed)
Inpatient Rehab Discharge Instructions  Katherine Cooke Discharge date and time: 03/02/15   Activities/Precautions/ Functional Status: Activity: activity as tolerated Diet: soft foods Wound Care: none needed   Functional status:  ___ No restrictions     ___ Walk up steps independently _X__ 24/7 supervision/assistance   ___ Walk up steps with assistance ___ Intermittent supervision/assistance  ___ Bathe/dress independently _X__ Walk with walker/assistance   _X__ Bathe/dress with assistance ___ Walk Independently    ___ Shower independently ___ Walk with assistance    ___ Shower with assistance _X__ No alcohol     ___ Return to work/school ________  Special Instructions:  STROKE/TIA DISCHARGE INSTRUCTIONS SMOKING Cigarette smoking nearly doubles your risk of having a stroke & is the single most alterable risk factor  If you smoke or have smoked in the last 12 months, you are advised to quit smoking for your health.  Most of the excess cardiovascular risk related to smoking disappears within a year of stopping.  Ask you doctor about anti-smoking medications  [place] Quit Line: 1-800-QUIT NOW  Free Smoking Cessation Classes [phone]  CHOLESTEROL Know your levels; limit fat & cholesterol in your diet  Lipid Panel     Component Value Date/Time   CHOL 122 02/18/2015 0718   TRIG 88 02/18/2015 0718   HDL 41 02/18/2015 0718   CHOLHDL 3.0 02/18/2015 0718   VLDL 18 02/18/2015 0718   LDLCALC 63 02/18/2015 0718      Many patients benefit from treatment even if their cholesterol is at goal.  Goal: Total Cholesterol (CHOL) less than 160  Goal:  Triglycerides (TRIG) less than 150  Goal:  HDL greater than 40  Goal:  LDL (LDLCALC) less than 100   BLOOD PRESSURE American Stroke Association blood pressure target is less that 120/80 mm/Hg  Your discharge blood pressure is:  BP: (!) 129/44 mmHg  Monitor your blood pressure  Limit your salt and alcohol intake  Many individuals will  require more than one medication for high blood pressure  DIABETES (A1c is a blood sugar average for last 3 months) Goal HGBA1c is under 7% (HBGA1c is blood sugar average for last 3 months)  Diabetes:     Lab Results  Component Value Date   HGBA1C 5.7* 02/18/2015     Your HGBA1c can be lowered with medications, healthy diet, and exercise.  Check your blood sugar as directed by your physician  Call your physician if you experience unexplained or low blood sugars.  PHYSICAL ACTIVITY/REHABILITATION Goal is 30 minutes at least 4 days per week  Activity: No driving, Therapies:  To continue at SNF Return to work: N/A  Activity decreases your risk of heart attack and stroke and makes your heart stronger.  It helps control your weight and blood pressure; helps you relax and can improve your mood.  Participate in a regular exercise program.  Talk with your doctor about the best form of exercise for you (dancing, walking, swimming, cycling).  DIET/WEIGHT Goal is to maintain a healthy weight  Your discharge diet is: DIET DYS 3 Room service appropriate?: Yes with Assist; Fluid consistency:: Thin liquids Your height is:  Height: '5\' 2"'$  (157.5 cm) Your current weight is: Weight: 51.5 kg (113 lb 8.6 oz) Your Body Mass Index (BMI) is:  20.8  Following the type of diet specifically designed for you will help prevent another stroke.  You are at goal weight.  Your goal Body Mass Index (BMI) is 19-24.  Healthy food habits can  help reduce 3 risk factors for stroke:  High cholesterol, hypertension, and excess weight.  RESOURCES Stroke/Support Group:  Call [phone]   STROKE EDUCATION PROVIDED/REVIEWED AND GIVEN TO PATIENT Stroke warning signs and symptoms How to activate emergency medical system (call 911). Medications prescribed at discharge. Need for follow-up after discharge. Personal risk factors for stroke. Pneumonia vaccine given:  Flu vaccine given:  My questions have been answered,  the writing is legible, and I understand these instructions.  I will adhere to these goals & educational materials that have been provided to me after my discharge from the hospital.      My questions have been answered and I understand these instructions. I will adhere to these goals and the provided educational materials after my discharge from the hospital.  Patient/Caregiver Signature _______________________________ Date __________  Clinician Signature _______________________________________ Date __________  Please bring this form and your medication list with you to all your follow-up doctor's appointments.

## 2015-03-02 NOTE — Progress Notes (Signed)
Social Work Patient ID: Katherine Cooke, female   DOB: [birth year], 81 y.o.   MRN: [id number]  CSW spoke with pt's team and with pt and her son, [name].  Pt will be ready for d/c today to [facility] and can travel via family vehicle.  Son feels comfortable transporting pt with assistance from [facility] staff and [employer].  CSW spoke with Altha Harm at [facility] and they are ready to receive her.  Copies of pt's records to accompany her.  CSW also updated pt's dtr, Manuela Schwartz, yesterday.  CSW remains available, as needed.

## 2015-03-02 NOTE — Progress Notes (Addendum)
Subjective/Complaints: Pt feels tired but no other c/o ROS- limited due to aphasia, denies SOB, abd pain  Objective: Vital Signs: Blood pressure 149/56, pulse 95, temperature 97.9 F (36.6 C), temperature source Oral, resp. rate 16, height $RemoveBe'5\' 2"'WdZTTmpiN$  (1.575 m), weight 51.5 kg (113 lb 8.6 oz), SpO2 97 %. No results found. Results for orders placed or performed during the hospital encounter of 02/22/15 (from the past 72 hour(s))  Creatinine, serum     Status: None   Collection Time: 03/01/15  4:35 AM  Result Value Ref Range   Creatinine, Ser 0.68 0.44 - 1.00 mg/dL   GFR calc non Af Amer >60 >60 mL/min   GFR calc Af Amer >60 >60 mL/min    Comment: (NOTE) The eGFR has been calculated using the CKD EPI equation. This calculation has not been validated in all clinical situations. eGFR's persistently <60 mL/min signify possible Chronic Kidney Disease.      HEENT: left eye ptosis Cardio: RRR and no murmur Resp: CTA B/L and unlabored GI: BS positive and non tender Extremity:  Pulses positive and No Edema Skin:   Intact Neuro: Alert/Oriented, Cranial Nerve Abnormalities Left CN 3, Abnormal Sensory difficult to assess secondary to aphasia, Abnormal Motor 4/5 in BUE and BLE, Abnormal FMC Ataxic/ dec FMC, Dysarthric and Aphasic Musc/Skel:  Other no pain with UE or LE ROM Gen NAD Left hemiataxia  Assessment/Plan: 1. 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 Stable for D/C today F/u Gen Surg in 1week to discuss abd surgery F/u Neuro as outpt See D/C summary See D/C instructions  FIM: Function - Bathing Position: Shower Body parts bathed by patient: Right arm, Left arm, Chest, Abdomen, Front perineal area, Buttocks, Back, Left lower leg, Right lower leg, Left upper leg, Right upper leg Body parts bathed by helper: Back Bathing not applicable: Right arm, Left arm, Chest, Abdomen, Front perineal area,  Buttocks, Right upper leg, Left upper leg, Right lower leg, Left lower leg Assist Level: Supervision or verbal cues  Function- Upper Body Dressing/Undressing What is the patient wearing?: Bra, Pull over shirt/dress Bra - Perfomed by patient: Thread/unthread right bra strap, Thread/unthread left bra strap, Hook/unhook bra (pull down sports bra) Bra - Perfomed by helper: Hook/unhook bra (pull down sports bra) Pull over shirt/dress - Perfomed by patient: Thread/unthread right sleeve, Thread/unthread left sleeve, Put head through opening, Pull shirt over trunk Button up shirt - Perfomed by patient: Thread/unthread right sleeve, Thread/unthread left sleeve, Pull shirt around back, Button/unbutton shirt Assist Level: Touching or steadying assistance(Pt > 75%) Set up : To obtain clothing/put away Function - Lower Body Dressing/Undressing What is the patient wearing?: Underwear, Pants, Socks, Shoes Position: Wheelchair/chair at sink Underwear - Performed by patient: Thread/unthread right underwear leg, Thread/unthread left underwear leg, Pull underwear up/down Underwear - Performed by helper: Thread/unthread right underwear leg, Thread/unthread left underwear leg, Pull underwear up/down Pants- Performed by patient: Thread/unthread right pants leg, Thread/unthread left pants leg, Pull pants up/down Socks - Performed by patient: Don/doff right sock, Don/doff left sock Shoes - Performed by patient: Don/doff right shoe, Don/doff left shoe, Fasten right, Fasten left Shoes - Performed by helper: Don/doff right shoe, Don/doff left shoe, Fasten right, Fasten left Assist Level: Touching or steadying assistance (Pt > 75%), Supervision or verbal cues  Function - Toileting Toileting activity did not occur: N/A Toileting steps completed by patient: Adjust clothing prior to toileting, Performs perineal hygiene, Adjust clothing after toileting Toileting  steps completed by helper: Adjust clothing after  toileting Toileting Assistive Devices: Grab bar or rail Assist level: Touching or steadying assistance (Pt.75%)  Function - Air cabin crew transfer activity did not occur: N/A Toilet transfer assistive device: Grab bar Assist level to toilet: Supervision or verbal cues Assist level from toilet: Supervision or verbal cues  Function - Chair/bed transfer Chair/bed transfer method: Ambulatory Chair/bed transfer assist level: Supervision or verbal cues Chair/bed transfer assistive device: Cane Chair/bed transfer details: Verbal cues for precautions/safety  Function - Locomotion: Wheelchair Will patient use wheelchair at discharge?: No Type: Manual Max wheelchair distance: 100 Assist Level: Total assistance (Pt < 25%) Assist Level: Total assistance (Pt < 25%) Wheel 150 feet activity did not occur: N/A Function - Locomotion: Ambulation Assistive device: Cane-straight Max distance: 150' x 2 reps Assist level: Supervision or verbal cues Assist level: Supervision or verbal cues Assist level: Supervision or verbal cues Assist level: Supervision or verbal cues Assist level: Supervision or verbal cues  Function - Comprehension Comprehension: Auditory Comprehension assist level: Follows basic conversation/direction with extra time/assistive device  Function - Expression Expression: Verbal Expression assist level: Expresses basic 75 - 89% of the time/requires cueing 10 - 24% of the time. Needs helper to occlude trach/needs to repeat words.  Function - Social Interaction Social Interaction assist level: Interacts appropriately 90% of the time - Needs monitoring or encouragement for participation or interaction.  Function - Problem Solving Problem solving assist level: Solves basic 75 - 89% of the time/requires cueing 10 - 24% of the time  Function - Memory Memory assist level: Recognizes or recalls 25 - 49% of the time/requires cueing 50 - 75% of the time Patient normally  able to recall (first 3 days only): Current season, That he or she is in a hospital  Medical Problem List and Plan: 1. 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 DVT Prophylaxis/Anticoagulation: Pharmaceutical: Lovenox to continue 3. Spinal stenosis/Pain Management: Hydrocodone constipating --continue Tylenol prn for pain. Avoid narcotics. continue add K pad.  4. Mood: LCSW to follow for evaluation and support.  5. Neuropsych: This patient is not capable of making decisions on her own behalf. 6. Skin/Wound Care: Routine pressure relief measures. Offer supplements between meals. Monitor perirectal scan given loose BMs 7. Fluids/Electrolytes/Nutrition: Monitor I/O. Continue supplements between meals. Variable meal intake still 8. HTN: Monitor BID. Avoid hypotension. By mouth intake is improving 9. Colonic lesion with near obstruction: Continue daily Miralax with second dose prn if no BM that day to prevent obstruction, no clinical signs, frequent loose bowel movements  10. Code status: Discussed--Patient and family desire Full Code.    LOS (Days) 8 A FACE TO FACE EVALUATION WAS PERFORMED  KIRSTEINS,ANDREW E 03/02/2015, 8:37 AM

## 2015-03-02 NOTE — Consult Note (Signed)
NEUROBEHAVIORAL STATUS EXAM - CONFIDENTIAL Katherine Cooke   MEDICAL NECESSITY:  Katherine Cooke was seen on the [place] [facility] for a neurobehavioral status exam owing to the patient's diagnosis of embolic stroke, and to assist in treatment planning during admission.   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 female with history of HTN, glaucoma, macular degeneration, BCC, abdominal mass.who was admitted on 02/13/15 with progressive abdominal pain X 1 week and constipation. She was found to have extensive left colonic diverticulosis with complex mas 7.6 X 7.6 x 6.3 cm , 1.4 cm liver lesion and small pelvic and retroperitoneal nodes.biopsy positive for poorly differentiated adenocarcinoma which was likely arising from outside the colon. Pathology with possibility of lung or thyroid primary.On 09/13, patient was noted to have confusion with difficulty talking. MRI brain done revealing multifocal areas of acute infarct affecting cerebellum, left paramedian pons, left thalamus, left posterior parietal and occipital regions question embolic from posterior circulation. Neurology consulted and recommended full work up with CTA brain/neck done revealing acute/subacute infarcts left thalamus and right cerebellum and moderate stenosis at origins of B-VA worse on right."    Patient was specifically referred to neuropsychology to assess whether her cognitive symptoms are associated with expressive aphasia versus neurological insult, and to assist with coping.   During today's visit, Katherine Cooke reported that she was set to have surgery for her abdominal mass, and on the way to get the surgery she had a stroke. She purportedly suffers from post-stroke memory  issues, slowed processing speed, decreased attention and concentration, and mild expressive dysphasia. She feels that these have remained stable.   From an emotional standpoint, Katherine Cooke described her current mood as "okay." She has no history of mental health issues or treatment. She expressed remorse about not having her abdominal mass evaluated by a doctor sooner. She is still looking forward to having the surgeries performed with the hopes of spending as much time with family as possible. No adjustment issues endorsed. Suicidal/homicidal ideation, plan or intent was denied. No manic or hypomanic episodes were reported. The patient denied ever experiencing any auditory/visual hallucinations. No major behavioral or personality changes were endorsed.   When asked about therapy, Katherine Cooke said that she feels that some progress has been made. She described the rehab staff as "very nice." No major barriers to therapy identified, though she gets fatigued quite easily. She has a son who came from [place] to visit for two weeks. She has another son and a daughter in [place] that have been very supportive.   PROCEDURES: [2 units [address]109] Diagnostic clinical interview  Review of available records Montreal Cognitive Assessment (short-form)  MENTAL STATUS: Katherine Cooke mental status exam score of 11/22 is well below the cutoff used to indicate cognitive impairment and frank dementia. She was oriented to place and date except that she said it was "2012." Initial learning was intact, but free recall and recognition were markedly impaired. Response inhibition was poor, but simple attention was intact. She could not perform mental calculation, but she could spell "world" backwards. Letter fluency was impaired, as was abstraction.    IMPRESSION: Katherine Cooke reported suffering from cognitive deficits post-stroke, and cognitive screen evidenced impairments at the level of dementia. I do not feel that these  deficits are solely related  to expressive language issues. In fact, speech was fluent and comprehension intact. There were only a few times that the patient seemed to need increased time to process information. Regardless, I feel the cognitive impairments are severe and likely related to neurological insult and definitely not just secondary to speech/language issues. As for mood, I think that Katherine Cooke is actually in relatively good spirits despite her unfortunate medical circumstances. No major adjustment issues noted.    At this time, given that Katherine Cooke is discharging soon, neuropsychology does not plan to follow-up any more during this admission. It would behoove her care providers and family to be aware that significant cognitive deficits are present. Increased care and supervision appear warranted for most instrumental daily activities (i.e., medication and financial management; driving; meals, etc.).    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  Clinical Neuropsychologist

## 2015-03-02 NOTE — Progress Notes (Signed)
Social Work Discharge Note Discharge Note  The overall goal for the admission was met for:   Discharge location: Yes-[facility]  Length of Stay: Yes-8 DAYS  Discharge activity level: Yes-SUPERVISION/MIN LEVEL  Home/community participation: Yes  Services provided included: MD, RD, PT, OT, SLP, RN, CM, TR, Pharmacy, Neuropsych and SW  Financial Services: Private Insurance: [facility]  Follow-up services arranged: Other: NHP  Comments (or additional information):PT GOING TO [facility] FOR MORE REHAB AND MEDICAL MANAGEMENT SON TRANSPORTING PT TO FACILITY. SON ASKING FOR COPY OF MEDICAL RECORDS-INFORMED OF THE PROCEDURE  TO [place]. PAM-PA TO SPEAK WITH SON PRIOR TO DISCHARGE DUE TO QUESTIONS.  Patient/Family verbalized understanding of follow-up arrangements: Yes  Individual responsible for coordination of the follow-up plan: FRED-SON & SUSAN-DAUGHTER  Confirmed correct DME delivered: Elease Hashimoto 03/02/2015    Elease Hashimoto

## 2015-03-04 NOTE — Progress Notes (Signed)
Occupational Therapy Discharge Summary  Patient Details  Name: Katherine Cooke MRN: [id number] Date of Birth: 02/16/1926   Patient has met 12 of 13  long term goals due to improved activity tolerance, improved balance, ability to compensate for deficits and improved awareness.  Patient to discharge at overall Supervision level.  Patient's care partner unavailable to provide the necessary cognitive assistance at discharge therefore patient is discharged to SNF for continued rehabilitation needs.    Reasons goals not met: Inconsistent awareness of deficits.  Recommendation:  Patient will benefit from ongoing skilled OT services in skilled nursing facility setting to continue to advance functional skills in the area of BADL and iADL.  Equipment: No equipment provided  Reasons for discharge: discharge from hospital  Patient/family agrees with progress made and goals achieved: Yes  OT Discharge Precautions/Restrictions  Precautions Precautions: Fall Precaution Comments: Visual deficits; glaucoma, macular degeneration, blurring of vision R eye, metastatic cancer, spinal stenosis Restrictions Weight Bearing Restrictions: No  Pain Pain Assessment Pain Assessment: 0-10  ADL ADL ADL Comments: see Functional Assessment  Vision/Perception  Vision- History Baseline Vision/History: Macular Degeneration;Glaucoma Patient Visual Report: Blurring of vision Vision- Assessment Vision Assessment?: Yes Additional Comments: History of macular degeneration, severe visual impairment at right eye Perception Comments: R/L discrimination impaired   Cognition Overall Cognitive Status: Impaired/Different from baseline Arousal/Alertness: Awake/alert Orientation Level: Oriented X4 Attention: Selective;Focused;Sustained Focused Attention: Appears intact Sustained Attention: Appears intact Selective Attention: Appears intact Memory: Impaired Memory Impairment: Decreased short term  memory Decreased Short Term Memory: Verbal complex Awareness: Impaired Awareness Impairment: Emergent impairment Problem Solving: Impaired Problem Solving Impairment: Verbal complex;Functional complex Executive Function: Self Monitoring;Self Correcting Self Monitoring: Appears intact Self Correcting: Impaired Self Correcting Impairment: Verbal complex;Functional complex Safety/Judgment: Appears intact  Sensation Sensation Light Touch: Appears Intact Stereognosis: Appears Intact Hot/Cold: Appears Intact Proprioception: Appears Intact Coordination Gross Motor Movements are Fluid and Coordinated: No Fine Motor Movements are Fluid and Coordinated: Yes Coordination and Movement Description: mildly ataxic movements in RUE and RLE Finger Nose Finger Test: decreased speed but accuracy [facility]  Motor  Motor Motor: Other (comment) Motor - Discharge Observations: strength approaching equal bilaterally  Mobility  Bed Mobility Bed Mobility: Sit to Supine;Supine to Sit Supine to Sit: 7: Independent Sit to Supine: 7: Independent Transfers Transfers: Sit to Stand;Stand to Sit Sit to Stand: 6: Modified independent (Device/Increase time) Stand to Sit: 6: Modified independent (Device/Increase time)   Trunk/Postural Assessment  Cervical Assessment Cervical Assessment: Within Functional Limits Thoracic Assessment Thoracic Assessment: Within Functional Limits Lumbar Assessment Lumbar Assessment: Within Functional Limits Postural Control Postural Control: Within Functional Limits   Balance Balance Balance Assessed: Yes Standardized Balance Assessment Standardized Balance Assessment: Berg Balance Test Berg Balance Test Sit to Stand: Able to stand without using hands and stabilize independently Standing Unsupported: Able to stand safely 2 minutes Sitting with Back Unsupported but Feet Supported on Floor or Stool: Able to sit safely and securely 2 minutes Stand to Sit: Sits safely with  minimal use of hands Transfers: Able to transfer safely, definite need of hands Standing Unsupported with Eyes Closed: Able to stand 10 seconds safely Standing Ubsupported with Feet Together: Able to place feet together independently and stand 1 minute safely From Standing, Reach Forward with Outstretched Arm: Can reach confidently >25 cm (10") From Standing Position, Pick up Object from Floor: Able to pick up shoe safely and easily From Standing Position, Turn to Look Behind Over each Shoulder: Looks behind from both sides and weight shifts well Turn 360  Degrees: Able to turn 360 degrees safely one side only in 4 seconds or less Standing Unsupported, Alternately Place Feet on Step/Stool: Able to stand independently and complete 8 steps >20 seconds Standing Unsupported, One Foot in Front: Able to take small step independently and hold 30 seconds Standing on One Leg: Unable to try or needs assist to prevent fall Total Score: 47 Static Sitting Balance Static Sitting - Balance Support: Feet supported;No upper extremity supported Static Sitting - Level of Assistance: 7: Independent Dynamic Sitting Balance Dynamic Sitting - Balance Support: Feet supported;No upper extremity supported Dynamic Sitting - Level of Assistance: 6: Modified independent (Device/Increase time) Dynamic Sitting - Balance Activities: Tourist information centre manager Standing - Balance Support: No upper extremity supported Static Standing - Level of Assistance: 6: Modified independent (Device/Increase time) Dynamic Standing Balance Dynamic Standing - Balance Support: No upper extremity supported Dynamic Standing - Level of Assistance: 6: Modified independent (Device/Increase time) Dynamic Standing - Balance Activities: Ball toss  Extremity/Trunk Assessment RUE Assessment RUE Assessment: Within Functional Limits LUE Assessment LUE Assessment: Within Functional Limits   See Function Navigator for Current  Functional Status.  Salome Spotted 03/05/2015, 7:17 AM

## 2015-03-09 ENCOUNTER — Telehealth: Payer: Self-pay | Admitting: Internal Medicine

## 2015-03-09 ENCOUNTER — Encounter (HOSPITAL_COMMUNITY): Payer: Self-pay

## 2015-03-09 NOTE — Telephone Encounter (Signed)
Hosp f/u appt-s/w patient son [name] and gave appt for 10/17 @ 1:45 w/Dr. Julien Nordmann

## 2015-03-11 ENCOUNTER — Telehealth: Payer: Self-pay | Admitting: *Deleted

## 2015-03-11 NOTE — Telephone Encounter (Signed)
Mr Beazer called asking for you to call him about his mother Katherine Cooke.

## 2015-03-15 NOTE — Telephone Encounter (Signed)
Returned call to [employer]. He had multiple questions about need for follow up with Dr. Hassell Done at this time as well as transition of his mother back to her appt. Recommended that she needs to be monitored by Dr. Hassell Done closely and he will reschedule that appointment. Also recommended discussing her progress with Rehab team as well as transition to ALF rather than [place] due to need for closer monitoring.

## 2015-03-19 ENCOUNTER — Other Ambulatory Visit: Payer: Self-pay | Admitting: Medical Oncology

## 2015-03-19 DIAGNOSIS — C189 Malignant neoplasm of colon, unspecified: Secondary | ICD-10-CM

## 2015-03-22 ENCOUNTER — Other Ambulatory Visit: Payer: Self-pay | Admitting: Medical Oncology

## 2015-03-22 ENCOUNTER — Ambulatory Visit (HOSPITAL_BASED_OUTPATIENT_CLINIC_OR_DEPARTMENT_OTHER): Payer: Medicare Other | Admitting: Internal Medicine

## 2015-03-22 ENCOUNTER — Encounter: Payer: Self-pay | Admitting: Internal Medicine

## 2015-03-22 ENCOUNTER — Other Ambulatory Visit (HOSPITAL_BASED_OUTPATIENT_CLINIC_OR_DEPARTMENT_OTHER): Payer: Medicare Other

## 2015-03-22 VITALS — BP 136/68 | HR 93 | Temp 98.2°F | Resp 20 | Ht 62.0 in | Wt 106.0 lb

## 2015-03-22 DIAGNOSIS — C187 Malignant neoplasm of sigmoid colon: Secondary | ICD-10-CM

## 2015-03-22 DIAGNOSIS — R399 Unspecified symptoms and signs involving the genitourinary system: Secondary | ICD-10-CM

## 2015-03-22 DIAGNOSIS — C189 Malignant neoplasm of colon, unspecified: Secondary | ICD-10-CM

## 2015-03-22 LAB — URINALYSIS, MICROSCOPIC - CHCC
BLOOD: NEGATIVE
Bilirubin (Urine): NEGATIVE
Glucose: NEGATIVE mg/dL
KETONES: NEGATIVE mg/dL
Leukocyte Esterase: NEGATIVE
Nitrite: NEGATIVE
Protein: NEGATIVE mg/dL
RBC / HPF: NEGATIVE (ref 0–2)
SPECIFIC GRAVITY, URINE: 1.03 (ref 1.003–1.035)
Urobilinogen, UR: 0.2 mg/dL (ref 0.2–1)
pH: 6 (ref 4.6–8.0)

## 2015-03-22 NOTE — Progress Notes (Signed)
Katherine Cooke   Fax:([phone] [address], [place] [address]  DIAGNOSIS:  1) Adenocarcinoma of unknown primary questionable for lung primary presented with masslike soft tissue involving the sigmoid colon diagnosed in September 2016. 2) multifocal acute infarct affecting the cerebellum, left paramedian pons, left thalamus and left posterior parietal and occipital regions diagnosed in September 2016.  PRIOR THERAPY: None  CURRENT THERAPY: None  INTERVAL HISTORY: Katherine Cooke 81 y.o. female returns to the clinic today for hospital follow-up visit. The patient was seen for initial evaluation during her hospitalization at [facility] when she was found to have a mass in the sigmoid colon area and colonoscopy was performed by gastroenterology and the final pathology was consistent with poorly differentiated adenocarcinoma. There are clusters of poorly differentiated malignant cells. The cells are located beneath benign appearing colonic mucosa raising concern for a non-colon primary. Immunohistochemistry reveals the cells are positive for cytokeratin 7 (diffuse strong), TTF-1 (diffuse strong), and CD56 (diffuse strong), p53, and WT-1 (focal). There is only very weak focal staining with ER. The cells are negative for NapsinA, cytokeratin 20, CDX-2, chromogranin, synaptophysin, calretinin, and calcitonin. The strong cytokeratin 7 and TTF-1 staining raises the possibility of a lung or thyroid primary. CT scan of the chest on 02/19/2015 showed no evidence of a primary malignancy or metastatic disease in the thorax no thyroid masses. The tissue block was sent to [facility] one for molecular by marker studies. No actionable mutation were detected. Tumor markers during her hospitalization showed CA-19-9 was 54, CEA 1.8 and CA-125 was 314.5. During her hospitalization the patient was diagnosed with  a stroke. She was transferred to the inpatient rehabilitation for physical therapy. She is currently a resident of his skilled nursing facility. The patient is scheduled to see Dr. Hassell Done with Gen. surgery on 03/26/2015. She was also seen by Dr. Georgiann Cocker at [facility] and had a PET scan is scheduled for 03/24/2015. The patient is feeling much better but she continues to have difficulty processing her thoughts. She is here today for reevaluation and discussion of her treatment options. She denied having any significant chest pain, shortness of breath, cough or hemoptysis. The patient denied having any nausea or vomiting, no fever or chills. She has no significant change in her bowel movement.  MEDICAL HISTORY: Past Medical History  Diagnosis Date  . Hypertension   . Spinal stenosis   . Basal cell carcinoma of nose 2005  . Squamous cell carcinoma of left hip 2005  . Colon cancer ([place]) dx'd 02/16/15  . Diabetes mellitus without complication ([place])   . Glaucoma   . Macular degeneration     ALLERGIES:  is allergic to lyrica.  MEDICATIONS:  Current Outpatient Prescriptions  Medication Sig Dispense Refill  . acetaminophen (TYLENOL) 500 MG tablet Take 1 tablet (500 mg total) by mouth 2 (two) times daily with breakfast and lunch. 30 tablet 0  . aspirin 325 MG tablet Take 1 tablet (325 mg total) by mouth daily.    . cycloSPORINE (RESTASIS) 0.05 % ophthalmic emulsion Place 1 drop into both eyes 2 (two) times daily.    . diphenhydrAMINE (BENADRYL) 12.5 MG/5ML elixir Take 5 mLs (12.5 mg total) by mouth every 8 (eight) hours as needed for itching. 120 mL 0  . docusate sodium (COLACE) 100 MG capsule Take 2 capsules (200 mg total) by mouth at bedtime. 10 capsule 0  .  enoxaparin (LOVENOX) 100 MG/ML injection Inject 0.5 mLs (50 mg total) into the skin daily. 0 Syringe   . feeding supplement, ENSURE ENLIVE, (ENSURE ENLIVE) LIQD Take 237 mLs by mouth 2 (two) times daily between meals. 237 mL 12  .  fluticasone (FLONASE) 50 MCG/ACT nasal spray Place 1 spray into both nostrils daily.  2  . montelukast (SINGULAIR) 10 MG tablet Take 10 mg by mouth at bedtime.  3  . Multiple Vitamins-Minerals (EYE VITAMINS PO) Take by mouth.    . ondansetron (ZOFRAN) 4 MG tablet Take 1 tablet (4 mg total) by mouth every 6 (six) hours as needed for nausea. 20 tablet 0  . polyethylene glycol (MIRALAX / GLYCOLAX) packet Take 17 g by mouth daily. 14 each 0  . polyethylene glycol (MIRALAX / GLYCOLAX) packet Take 17 g by mouth daily as needed for mild constipation (Repeat at supper if bowels have not moved earlier in the day). 14 each 0  . polyvinyl alcohol (LIQUIFILM TEARS) 1.4 % ophthalmic solution Place 1 drop into both eyes as needed for dry eyes. 15 mL 0  . saccharomyces boulardii (FLORASTOR) 250 MG capsule Take 1 capsule (250 mg total) by mouth 2 (two) times daily.    . sodium phosphate (FLEET) 7-19 GM/118ML ENEM Place 133 mLs (1 enema total) rectally once as needed for severe constipation.  0   No current facility-administered medications for this visit.    SURGICAL HISTORY:  Past Surgical History  Procedure Laterality Date  . Breast biopsy  1950's ; 1952 ; 1954    x 3 - Benign  . Tonsillectomy    . Flexible sigmoidoscopy N/A 02/15/2015    Procedure: FLEXIBLE SIGMOIDOSCOPY;  Surgeon: Ronald Lobo, MD;  Location: [facility] ENDOSCOPY;  Service: Endoscopy;  Laterality: N/A;    REVIEW OF SYSTEMS:  Constitutional: positive for fatigue and weight loss Eyes: negative Ears, nose, mouth, throat, and face: negative Respiratory: negative Cardiovascular: negative Gastrointestinal: negative Genitourinary:negative Integument/breast: negative Hematologic/lymphatic: negative Musculoskeletal:negative Neurological: positive for memory problems and weakness Behavioral/Psych: negative Endocrine: negative Allergic/Immunologic: negative   PHYSICAL EXAMINATION: General appearance: alert, cooperative, fatigued and no  distress Head: Normocephalic, without obvious abnormality, atraumatic Neck: no adenopathy, no JVD, supple, symmetrical, trachea midline and thyroid not enlarged, symmetric, no tenderness/mass/nodules Lymph nodes: Cervical, supraclavicular, and axillary nodes normal. Resp: clear to auscultation bilaterally Back: symmetric, no curvature. ROM normal. No CVA tenderness. Cardio: regular rate and rhythm, S1, S2 normal, no murmur, click, rub or gallop GI: soft, non-tender; bowel sounds normal; no masses,  no organomegaly Extremities: extremities normal, atraumatic, no cyanosis or edema Neurologic: Alert and oriented X 3, normal strength and tone. Normal symmetric reflexes. Normal coordination and gait  ECOG PERFORMANCE STATUS: 1 - Symptomatic but completely ambulatory  Blood pressure 136/68, pulse 93, temperature 98.2 F (36.8 C), temperature source Oral, resp. rate 20, height _0  (1.575 m), weight 106 lb (48.081 kg), SpO2 98 %.  LABORATORY DATA: Lab Results  Component Value Date   WBC 12.4* 02/23/2015   HGB 10.2* 02/23/2015   HCT 30.6* 02/23/2015   MCV 96.8 02/23/2015   PLT 549* 02/23/2015      Chemistry      Component Value Date/Time   NA 134* 02/26/2015 0625   K 4.0 02/26/2015 0625   CL 100* 02/26/2015 0625   CO2 28 02/26/2015 0625   BUN 13 02/26/2015 0625   CREATININE 0.68 03/01/2015 0435      Component Value Date/Time   CALCIUM 8.7* 02/26/2015 0625   ALKPHOS  76 02/23/2015 0404   AST 41 02/23/2015 0404   ALT 27 02/23/2015 0404   BILITOT 0.3 02/23/2015 0404       RADIOGRAPHIC STUDIES: Dg Chest 2 View  02/22/2015  CLINICAL DATA:  Generalized weakness. Stroke several days ago. No chest pain or shortness of breath. History of hypertension. EXAM: CHEST  2 VIEW COMPARISON:  02/19/2015, 02/17/2015 FINDINGS: Heart is mildly enlarged. There is perihilar peribronchial thickening. There are no focal consolidations or effusions. No pulmonary edema. IMPRESSION: 1. Mild cardiomegaly.   No pulmonary edema. 2. Bronchitic changes.  No focal acute pulmonary abnormality. Electronically Signed   By: Nolon Nations M.D.   On: 02/22/2015 16:10   Dg Abd 1 View  02/22/2015  CLINICAL DATA:  Generalized weakness, stroke several days ago, no abdominal pain. EXAM: ABDOMEN - 1 VIEW COMPARISON:  Abdominal plain film dated 10/13/2014 and abdominal CT dated 02/13/2015. FINDINGS: Bowel gas pattern is nonobstructive. No evidence of soft tissue mass or abnormal fluid collection seen, although mass versus abscess was described in the region of the sigmoid colon on earlier CT. No free intraperitoneal air seen. Degenerative changes noted within the scoliotic thoracolumbar spine. No acute - appearing osseous abnormality. Scarring versus atelectasis noted at each lung base, partially obscured on the right by an elevated hemidiaphragm. IMPRESSION: Nonobstructive bowel gas pattern and no evidence of acute intra-abdominal or intrapelvic abnormality. Please note that earlier CT of 02/13/2015 showed a colonic malignancy versus abscess in the left pelvis. Electronically Signed   By: Franki Cabot M.D.   On: 02/22/2015 16:13    ASSESSMENT AND PLAN: This is a very pleasant 81 years old white female presented with soft tissue thickening involving the sigmoid colon with questionable abdominal lymphadenopathy and the biopsy was consistent with poorly differentiated adenocarcinoma with positive cytokeratin 7 and TTF-1 raising the possibility of lung or thyroid primary but the CT scan of the chest showed no evidence for primary lesion in the lung or the thyroid gland. There was also concern about the possibility of a primary ovarian cancer but colon cancer could not be completely excluded at this point. I had a lengthy discussion with the patient and her son today about her current condition. I agree with proceeding with a PET scan in 2 days as a scheduled by Dr. Georgiann Cocker The patient may also benefit from surgical  exploration and excision of the sigmoid soft tissue mass for further evaluation of her condition. The patient is recently recovering from acute stroke and this may cause some delay of her surgical intervention. She was seen by Dr. Georgiann Cocker at [facility] oncology and she will have a follow-up appointment with her after the PET scan. I explained to the patient and her son that I'll be happy to take care of her at the [facility] if the final diagnosis was consistent with lung cancer or I would refer her to one of my partner if it is a different oncologic primary. The patient and her son agreed to the current plan. They will call for an appointment after completion evaluation by surgery. The patient was advised to call immediately if she has any concerning symptoms in the interval. The patient voices understanding of current disease status and treatment options and is in agreement with the current care plan.  All questions were answered. The patient knows to call the clinic with any problems, questions or concerns. We can certainly see the patient much sooner if necessary.   Disclaimer: This note was dictated  with voice recognition software. Similar sounding words can inadvertently be transcribed and may not be corrected upon review.

## 2015-03-29 ENCOUNTER — Inpatient Hospital Stay: Payer: Medicare Other | Admitting: Physical Medicine & Rehabilitation

## 2015-03-29 ENCOUNTER — Telehealth: Payer: Self-pay | Admitting: *Deleted

## 2015-03-29 NOTE — Telephone Encounter (Signed)
Copy of urine results faxed to [facility]

## 2015-04-02 ENCOUNTER — Inpatient Hospital Stay: Payer: Medicare Other | Admitting: Physical Medicine & Rehabilitation

## 2015-04-14 ENCOUNTER — Telehealth: Payer: Self-pay | Admitting: Physical Medicine & Rehabilitation

## 2015-04-14 NOTE — Telephone Encounter (Signed)
What do you advise?

## 2015-04-14 NOTE — Telephone Encounter (Signed)
Patients son Laquisha Northcraft would like a call back about his moms appointment on 04/19/15.  Would like to speak to RN or Dr. Letta Pate as to why she needs to come and if it is necessary.   Please call him at [phone].

## 2015-04-15 NOTE — Telephone Encounter (Signed)
If the patient is still requiring assistance for dressing and bathing and ambulation I would like to see her back. If she is now fairly independent she may cancel this visit

## 2015-04-16 NOTE — Telephone Encounter (Signed)
Spoke with son and told him that it would be a good idea for him to go ahead and bring her in since she was still having issues ambulating.

## 2015-04-19 ENCOUNTER — Inpatient Hospital Stay: Payer: Medicare Other | Admitting: Physical Medicine & Rehabilitation

## 2015-04-21 ENCOUNTER — Ambulatory Visit: Payer: Medicare Other | Admitting: Neurology

## 2015-04-22 ENCOUNTER — Encounter: Payer: Self-pay | Admitting: Neurology

## 2015-04-22 ENCOUNTER — Ambulatory Visit (INDEPENDENT_AMBULATORY_CARE_PROVIDER_SITE_OTHER): Payer: Medicare Other | Admitting: Neurology

## 2015-04-22 VITALS — BP 124/65 | HR 91 | Ht 61.0 in | Wt 105.6 lb

## 2015-04-22 DIAGNOSIS — I63443 Cerebral infarction due to embolism of bilateral cerebellar arteries: Secondary | ICD-10-CM

## 2015-04-22 DIAGNOSIS — K6389 Other specified diseases of intestine: Secondary | ICD-10-CM

## 2015-04-22 NOTE — Progress Notes (Signed)
STROKE NEUROLOGY FOLLOW UP NOTE  NAME: Katherine Cooke DOB: 09/24/1925  REASON FOR VISIT: stroke follow up HISTORY FROM: sons and chart  Today we had the pleasure of seeing Katherine Cooke in follow-up at our Neurology Clinic. Pt was accompanied by sons.   History Summary Katherine Cooke is a 81 y.o. female with history of HTN, basal cell carcinoma, spinal stenosis, recent diagnosed colon cancer metastasis to liver was admitted on 02/19/15 for left ptosis, right facial droop, and slurred speech. MRI showed b/l cerebellar, left pontine, left thalamus, and left MCA/PCA territory infarcts. CTA head and neck unremarkable. Venous doppler negative for DVT and TEE, LDL and A1C unremarkable. Given concern for hypercoagulable state due to advanced malignancy, lovenox therapy was recommended. Pharmacy consulted for lovenox initiation and recommended $RemoveBeforeD'1mg'lhWBzHDYctBWHp$ /kg daily. Symptoms gradually getting better and she was discharged with lovenox $RemoveBefo'45mg'qcpnIOQbFrP$  daily and ASA $Remov'325mg'FfDCUD$  to CIR.   Interval History During the interval time, the patient has been doing better. Neuro deficit continues getting better, however, when pt tired, fatigue, or with underlying disease progression, she has recrudescence of her previous stroke symptoms including diplopia, speech difficulty. She is still on lovenox and now on $Remo'50mg'fQOrY$  daily. Her ASA was recently decreased from $RemoveBefore'325mg'ljaxhXHEozkYk$  to $R'81mg'Fi$ . She follows with oncology and surgery for potential surgical intervention. BP 124/65.   REVIEW OF SYSTEMS: Full 14 system review of systems performed and notable only for those listed below and in HPI above, all others are negative:  Constitutional:  Weight loss, fatigue Cardiovascular:  Ear/Nose/Throat:   Skin:  Eyes:   Respiratory:   Gastroitestinal:   Genitourinary: urinary frequency Hematology/Lymphatic:   Endocrine:  Musculoskeletal:  Aching muscles Allergy/Immunology:   Neurological:  Memory loss, confusion, weakness, slurry speech Psychiatric:  decreased energy, change in appetite Sleep:   The following represents the patient's updated allergies and side effects list: Allergies  Allergen Reactions  . Lyrica [Pregabalin] Shortness Of Breath    The neurologically relevant items on the patient's problem list were reviewed on today's visit.  Neurologic Examination  A problem focused neurological exam (12 or more points of the single system neurologic examination, vital signs counts as 1 point, cranial nerves count for 8 points) was performed.  Blood pressure 124/65, pulse 91, height $RemoveBe'5\' 1"'WsSwXrjvX$  (1.549 m), weight 105 lb 9.6 oz (47.9 kg).  General - Well nourished, well developed, in no apparent distress.  Ophthalmologic - Fundi not visualized due to small pupils.  Cardiovascular - Regular rate and rhythm with no murmur.  Mental Status -  Level of arousal and orientation to time, place, and person were intact. Language intact at reading, comprehension, repetition. Pt does have word finding difficulty and paraphasic errors and frequent hesitation. Fund of Knowledge was assessed and was impaired.  Cranial Nerves II - XII - II - Visual field intact OU. III, IV, VI - Extraocular movements intact, no diplopia, PERRL. V - Facial sensation intact bilaterally. VII - Facial movement showed mild right nasolabial fold flattening. VIII - Hearing & vestibular intact bilaterally. X - Palate elevates symmetrically. XI - Chin turning & shoulder shrug intact bilaterally. XII - Tongue protrusion intact.  Motor Strength - The patient's strength was 4+/5 in all extremities and pronator drift was absent. Bulk was normal and fasciculations were absent.  Motor Tone - Muscle tone was assessed at the neck and appendages and was normal.  Reflexes - The patient's reflexes were 1+ in all extremities and she had no pathological reflexes.  Sensory -  Light touch, temperature/pinprick were assessed and were symmetrical.   Coordination - The patient  had normal movements in the hands with no ataxia or dysmetria. Tremor was absent.   Data reviewed: I personally reviewed the images and agree with the radiology interpretations.  CT chest with contrast 02/19/2015 1. No evidence of a primary malignancy or metastatic disease in the thorax. Specifically, no lung mass and no thyroid mass. 2. COPD/emphysema. Mild atelectasis in the lower lobes. Mucous plugging involving segmental right lower lobe bronchi. Small bilateral pleural effusions. No acute cardiopulmonary disease otherwise. 3. Moderate cardiomegaly. Extensive mitral annular calcification. Mild aortic valvular calcification. Severe LAD coronary atherosclerosis.  Ct Angio Head and Neck W/cm &/or Wo Cm 02/17/2015  1. Acute/subacute infarcts of the left thalamus and right cerebellum.  2. Moderate stenoses at the origins of the vertebral arteries bilaterally, worse on the right.  3. Tortuosity of the vertebral arteries without other focal stenoses.  4. The posterior cerebral arteries are of fetal type with a small P1 segments.  5. Marked tortuosity of the common and internal carotid arteries bilaterally without significant stenoses.  6. Dense atherosclerotic calcifications at the carotid bifurcations bilaterally without significant stenosis.  7. Multilevel spondylosis of the cervical spine.   Dg Chest 1 View 02/17/2015  No active disease.   Ct Head Wo Contrast  02/17/2015  No acute intracranial findings.   Mr Brain Wo Contrast 02/17/2015  Multifocal areas of acute infarction affecting the cerebellum, LEFT paramedian pons, LEFT thalamus, and LEFT posterior parietal and occipital regions. Posterior circulation event is suspected. Generalized atrophy and mild small vessel disease, not unexpected for age.  CT abdomen/pelvis - 1. Masslike soft tissue involving the sigmoid colon, most concerning for primary colonic malignancy with necrotic component or less likely  abscess. While there is extensive left-sided colonic diverticulosis, this appears more complex and masslike than typical perforated diverticulitis and abscess formation. 2. Small pelvic and retroperitoneal lymph nodes, indeterminate but could represent nodal metastatic disease. 3. 1.4 cm hypoattenuating liver lesion, indeterminate although a metastasis is not excluded. 4. Tiny right lower lobe lung nodules measuring 2-3 mm in size.  Colon biopsy - There are clusters of poorly differentiated malignant cells. The cells are located beneath benign appearing colonic mucosa raising concern for a non-colon primary. Immunohistochemistry reveals the cells are positive for cytokeratin 7 (diffuse strong), TTF-1 (diffuse strong), and CD56 (diffuse strong), p53, and WT-1 (focal). There is only very weak focal staining with ER. The cells are negative for NapsinA, cytokeratin 20, CDX-2, chromogranin, synaptophysin, calretinin, and calcitonin. The strong cytokeratin 7 and TTF-1 staining raises the possibility of a lung or thyroid primary. Clinical correlation is recommended. Dr. Saralyn Pilar has reviewed the case. The case was called to Dr. Cristina Gong on 02/16/2015 and Dr. Julien Nordmann on 02/18/2015. Per request tissue will be sent for [facility].  Component     Latest Ref Rng 02/17/2015 02/18/2015  Cholesterol     0 - 200 mg/dL  122  Triglycerides     <150 mg/dL  88  HDL Cholesterol     >40 mg/dL  41  Total CHOL/HDL Ratio       3.0  VLDL     0 - 40 mg/dL  18  LDL (calc)     0 - 99 mg/dL  63  Hemoglobin A1C     4.8 - 5.6 %  5.7 (H)  Mean Plasma Glucose       117  CA 125     0.0 - 38.1  U/mL 314.5 (H)   CA 19-9     0 - 35 U/mL 54 (H)     Assessment: As you may recall, she is a 81 y.o. Caucasian female with PMH of HTN, basal cell carcinoma, spinal stenosis, recent diagnosed colon cancer metastasis to liver was admitted on 3/38/32 for embolic stroke involving b/l cerebellar, left pontine, left thalamus, and left  MCA/PCA territory infarcts. CTA head and neck unremarkable. Venous doppler negative for DVT and TEE, LDL and A1C unremarkable. Given concern for hypercoagulable state due to advanced malignancy, lovenox therapy was initiated and pharmacy recommended 1mg /kg daily. She was discharged with lovenox 45mg  daily and ASA 325mg . During the interval time, she is on lovenox 50mg  daily. Her ASA was recently decreased from 325mg  to 81mg . She follows with oncology and surgery for potential surgical intervention. Check ant-Xa level to see if lovenox dosing adequate.   Plan:  - continue ASA 81mg  and lovenox 50mg  daily - will contact pt's pharmacist regarding the correct dose of lovenox. - will test the anti-Xa level today - Follow up with your primary care physician for stroke risk factor modification. Recommend maintain blood pressure goal <130/80, diabetes with hemoglobin A1c goal below 6.5% and lipids with LDL cholesterol goal below 70 mg/dL.  - follow up with oncology and surgery  - follow up in 3 months  A total of 45 minutes was spent face-to-face with this patient. Over half this time was spent on counseling patient on the stroke secondary to hypercoagulable state due to advanced malignancy and different diagnostic and therapeutic options available.    Orders Placed This Encounter  Procedures  . Heparin Anti-Xa    Meds ordered this encounter  Medications  . HYDROcodone-acetaminophen (NORCO/VICODIN) 5-325 MG tablet    Sig: Take 1 tablet by mouth every 6 (six) hours as needed for moderate pain.  Marland Kitchen aspirin 81 MG tablet    Sig: Take 81 mg by mouth daily.  Marland Kitchen senna (SENOKOT) 8.6 MG TABS tablet    Sig: Take 1 tablet by mouth.  . Turmeric 500 MG CAPS    Sig: Take by mouth.    Patient Instructions  - continue ASA 81mg  and lovenox 50mg  daily - will contact your pharmacist regarding the correct dose of lovenox. - will test the lovenox level today - Follow up with your primary care physician for stroke  risk factor modification. Recommend maintain blood pressure goal <130/80, diabetes with hemoglobin A1c goal below 6.5% and lipids with LDL cholesterol goal below 70 mg/dL.  - follow up with your oncology, surgery  - follow up in 3 months   Rosalin Hawking, MD PhD [facility] [address], [place], [place] [address] [phone]

## 2015-04-22 NOTE — Patient Instructions (Signed)
-   continue ASA '81mg'$  and lovenox '50mg'$  daily - will contact your pharmacist regarding the correct dose of lovenox. - will test the lovenox level today - Follow up with your primary care physician for stroke risk factor modification. Recommend maintain blood pressure goal <130/80, diabetes with hemoglobin A1c goal below 6.5% and lipids with LDL cholesterol goal below 70 mg/dL.  - follow up with your oncology, surgery  - follow up in 3 months

## 2015-04-23 ENCOUNTER — Other Ambulatory Visit: Payer: Self-pay | Admitting: Neurology

## 2015-04-23 DIAGNOSIS — I63443 Cerebral infarction due to embolism of bilateral cerebellar arteries: Secondary | ICD-10-CM

## 2015-04-23 LAB — HEPARIN ANTI-XA: HEPARIN ANTI-XA: 0.48 [IU]/mL

## 2015-04-23 MED ORDER — ENOXAPARIN SODIUM 80 MG/0.8ML ~~LOC~~ SOLN: 80.0000 mg | SUBCUTANEOUS | Status: DC

## 2015-04-23 NOTE — Progress Notes (Signed)
Pt anti Xa level came back which is 0.48, lower than the normal range 0.5-1.0. Discussed with pharmacist Maryanna Shape who did initial anticoagulation consultation in 02/2015, and she agreed to increase pt dose to 1.'5mg'$ /kg daily. I called the son and discuss about the increasing dose, and son checked on the lovenox he has and found out he was giving pt '60mg'$  daily. Therefore, we decided to give pt '80mg'$  daily injection. I have prescribed the meds to pt's pharmacy and son will give pt injection tomorrow with '80mg'$  daily. Son expressed understanding and appreciation.  Rosalin Hawking, MD PhD Stroke Neurology 04/23/2015 8:45 PM  Meds ordered this encounter  Medications  . enoxaparin (LOVENOX) 80 MG/0.8ML injection    Sig: Inject 0.8 mLs (80 mg total) into the skin daily.    Dispense:  30 Syringe    Refill:  2

## 2015-04-26 ENCOUNTER — Telehealth: Payer: Self-pay | Admitting: Neurology

## 2015-04-26 ENCOUNTER — Telehealth: Payer: Self-pay | Admitting: Internal Medicine

## 2015-04-26 NOTE — Telephone Encounter (Signed)
[name] son called Dr Erlinda Hong increased enoxaparin (LOVENOX) 80 MG/0.8ML injection to '80mg'$  Sat. Night . She has developed an itch within the last wk. She has tried cortizone, creams with no success and itch seems to have intensified since increasing medication. Could she be having reaction? He is inquiring if he could give injection in another part of her body. [name] talked with son.

## 2015-04-26 NOTE — Telephone Encounter (Signed)
Discussed with Katherine Cooke over the phone. Pt does have itchiness over the lovenox injection area. She is getting benadryl and triamcinolone cream from PCP. I also recommended injection to lower abdomen and b/l thigh to give more rotation sites for injection. Katherine Cooke also stated that pt felt weaker this am and has some concerning for anemia for the last 2 months, but there is no frank bleeding anywhere. I suggested to contact PCP to get CBC repeated. If significant anemia from before, we may have to consider d/c or change lovenox dose. He stated that pt has appointment with PCP next Tuesday, but he will try to get CBC done earlier than that.  Rosalin Hawking, MD PhD Stroke Neurology 04/26/2015 1:42 PM

## 2015-04-26 NOTE — Telephone Encounter (Signed)
Return call to Albertina Parr patient son informed him will speak with Dr. Julien Nordmann in reference to scheduling another visit. [phone]

## 2015-04-26 NOTE — Telephone Encounter (Signed)
Rn receive a incoming call from patients son Katherine Cooke about his mom having irritation to her side after the lovenox injection. Katherine Cooke stated he is giving the injection in the stomach. Rn stated lovenox injection should be giving in the love handles,and the sites should be rotated when giving. Pts son also his mom feels weaker and anemic since the dosage has increase. Rn explain that Dr. Erlinda Hong is in the hospital this week, and will give him a call. Pts son ask about some cream, RN stated Dr. Erlinda Hong can give some recommendation about some cream. Message will be sent to Dr .Erlinda Hong. Katherine Cooke is the son at [phone]

## 2015-04-27 ENCOUNTER — Inpatient Hospital Stay
Admission: RE | Admit: 2015-04-27 | Discharge: 2015-04-27 | Disposition: A | Discharge status: Home / Self Care | Payer: Self-pay | Source: Ambulatory Visit | Attending: Internal Medicine | Admitting: Internal Medicine

## 2015-04-27 ENCOUNTER — Telehealth: Payer: Self-pay | Admitting: *Deleted

## 2015-04-27 ENCOUNTER — Other Ambulatory Visit: Payer: Self-pay | Admitting: Internal Medicine

## 2015-04-27 DIAGNOSIS — C189 Malignant neoplasm of colon, unspecified: Secondary | ICD-10-CM

## 2015-04-27 NOTE — Telephone Encounter (Signed)
Pt sonreturned call to pt son discussed with Girard Cooter, MD has reviewed her PET scan and recommends surgery then possible Chemo. Son advised he and pt have spoke to 3 other MD and 2 surgeons advised against the surgery as she has areas that are also affected in [place], kidney and liver, surgery would be too risky. Discussed with Girard Cooter that if the surgery is too risky it may not be the best option. He advised he mother (pt) has already decided to not do anything.  Encouraged pt to speak with hospice and palliative care about how they can give her quality of life while she is here. Girard Cooter thanked me for the call, advised he will speak with the staff at [facility] about this. No further concerns.

## 2015-04-27 NOTE — Telephone Encounter (Signed)
[name] son called for clarification for labs. Relayed to him PCP was to order labs per [name].He will call PCP

## 2015-04-28 ENCOUNTER — Telehealth: Payer: Self-pay | Admitting: Neurology

## 2015-04-28 NOTE — Telephone Encounter (Signed)
Rn call patients son back about his moms CBC panel. Rn explain that Dr. Erlinda Hong was notified about the labs. Pts hgb was 11.3, wbc 11.6, rbc 3.61, and platelets was 517,and Iron was  19 which is low. Pts son said she is chronic anemia and has stage 4 cancer. Rn stated the patients dosage on her lovenox per Dr. Erlinda Hong will remain the same and just consult PCP about any future lab issues. Pts son stated her understand and appreciate the call. Pts labs will be sent to medical records for scanning ii the system.

## 2015-04-28 NOTE — Telephone Encounter (Signed)
Katherine Cooke's son called in regarding the results of his mother's labs.  Advised that per [name] we do not have them in yet.

## 2015-04-28 NOTE — Telephone Encounter (Signed)
Rn call patients son [name] on her DPR about not having fax. Rn gave [name] the fax to the pod with Dr. Erlinda Hong nurse.

## 2015-04-28 NOTE — Telephone Encounter (Signed)
[name] son called and would like to know if pt's lab results have been received via fax. [name] son would like to have a call back to let him know when they have been received. [name] son is requesting a call from Dr. Erlinda Hong about the lab results and stats that it is very important that Dr. Erlinda Hong see's them. May call [phone]

## 2015-04-30 ENCOUNTER — Encounter (HOSPITAL_COMMUNITY): Payer: Self-pay | Admitting: Emergency Medicine

## 2015-04-30 ENCOUNTER — Emergency Department (HOSPITAL_COMMUNITY): Payer: Medicare Other

## 2015-04-30 ENCOUNTER — Inpatient Hospital Stay (HOSPITAL_COMMUNITY)
Admission: EM | Admit: 2015-04-30 | Discharge: 2015-05-06 | DRG: 374 | Disposition: A | Discharge status: Hospice | Payer: Medicare Other | Attending: Internal Medicine | Admitting: Internal Medicine

## 2015-04-30 DIAGNOSIS — C189 Malignant neoplasm of colon, unspecified: Secondary | ICD-10-CM | POA: Diagnosis present

## 2015-04-30 DIAGNOSIS — E43 Unspecified severe protein-calorie malnutrition: Secondary | ICD-10-CM | POA: Diagnosis present

## 2015-04-30 DIAGNOSIS — I639 Cerebral infarction, unspecified: Secondary | ICD-10-CM | POA: Diagnosis present

## 2015-04-30 DIAGNOSIS — D638 Anemia in other chronic diseases classified elsewhere: Secondary | ICD-10-CM | POA: Diagnosis present

## 2015-04-30 DIAGNOSIS — C187 Malignant neoplasm of sigmoid colon: Secondary | ICD-10-CM | POA: Diagnosis present

## 2015-04-30 DIAGNOSIS — R64 Cachexia: Secondary | ICD-10-CM | POA: Diagnosis present

## 2015-04-30 DIAGNOSIS — Z85828 Personal history of other malignant neoplasm of skin: Secondary | ICD-10-CM | POA: Diagnosis not present

## 2015-04-30 DIAGNOSIS — N179 Acute kidney failure, unspecified: Secondary | ICD-10-CM | POA: Diagnosis present

## 2015-04-30 DIAGNOSIS — R1900 Intra-abdominal and pelvic swelling, mass and lump, unspecified site: Secondary | ICD-10-CM | POA: Insufficient documentation

## 2015-04-30 DIAGNOSIS — D72829 Elevated white blood cell count, unspecified: Secondary | ICD-10-CM | POA: Diagnosis present

## 2015-04-30 DIAGNOSIS — Z66 Do not resuscitate: Secondary | ICD-10-CM | POA: Diagnosis present

## 2015-04-30 DIAGNOSIS — D630 Anemia in neoplastic disease: Secondary | ICD-10-CM | POA: Diagnosis present

## 2015-04-30 DIAGNOSIS — D62 Acute posthemorrhagic anemia: Secondary | ICD-10-CM | POA: Diagnosis present

## 2015-04-30 DIAGNOSIS — Z515 Encounter for palliative care: Secondary | ICD-10-CM | POA: Diagnosis not present

## 2015-04-30 DIAGNOSIS — H3530 Unspecified macular degeneration: Secondary | ICD-10-CM | POA: Diagnosis present

## 2015-04-30 DIAGNOSIS — C787 Secondary malignant neoplasm of liver and intrahepatic bile duct: Secondary | ICD-10-CM | POA: Diagnosis not present

## 2015-04-30 DIAGNOSIS — H409 Unspecified glaucoma: Secondary | ICD-10-CM | POA: Diagnosis present

## 2015-04-30 DIAGNOSIS — Z8673 Personal history of transient ischemic attack (TIA), and cerebral infarction without residual deficits: Secondary | ICD-10-CM

## 2015-04-30 DIAGNOSIS — N1330 Unspecified hydronephrosis: Secondary | ICD-10-CM | POA: Diagnosis present

## 2015-04-30 DIAGNOSIS — C7800 Secondary malignant neoplasm of unspecified lung: Secondary | ICD-10-CM | POA: Diagnosis present

## 2015-04-30 DIAGNOSIS — E860 Dehydration: Secondary | ICD-10-CM | POA: Diagnosis present

## 2015-04-30 DIAGNOSIS — Z7189 Other specified counseling: Secondary | ICD-10-CM | POA: Diagnosis not present

## 2015-04-30 DIAGNOSIS — L299 Pruritus, unspecified: Secondary | ICD-10-CM | POA: Diagnosis present

## 2015-04-30 DIAGNOSIS — I63443 Cerebral infarction due to embolism of bilateral cerebellar arteries: Secondary | ICD-10-CM

## 2015-04-30 DIAGNOSIS — K6389 Other specified diseases of intestine: Secondary | ICD-10-CM

## 2015-04-30 DIAGNOSIS — Z888 Allergy status to other drugs, medicaments and biological substances status: Secondary | ICD-10-CM | POA: Diagnosis not present

## 2015-04-30 DIAGNOSIS — K651 Peritoneal abscess: Secondary | ICD-10-CM | POA: Diagnosis present

## 2015-04-30 DIAGNOSIS — N135 Crossing vessel and stricture of ureter without hydronephrosis: Secondary | ICD-10-CM | POA: Diagnosis present

## 2015-04-30 DIAGNOSIS — Z681 Body mass index (BMI) 19 or less, adult: Secondary | ICD-10-CM | POA: Diagnosis not present

## 2015-04-30 DIAGNOSIS — N321 Vesicointestinal fistula: Secondary | ICD-10-CM | POA: Diagnosis present

## 2015-04-30 DIAGNOSIS — N898 Other specified noninflammatory disorders of vagina: Secondary | ICD-10-CM | POA: Diagnosis present

## 2015-04-30 DIAGNOSIS — E440 Moderate protein-calorie malnutrition: Secondary | ICD-10-CM | POA: Diagnosis present

## 2015-04-30 LAB — CBC WITH DIFFERENTIAL/PLATELET
BASOS PCT: 0 %
Basophils Absolute: 0.1 10*3/uL (ref 0.0–0.1)
EOS ABS: 0.1 10*3/uL (ref 0.0–0.7)
EOS PCT: 0 %
HCT: 30.8 % — ABNORMAL LOW (ref 36.0–46.0)
Hemoglobin: 10 g/dL — ABNORMAL LOW (ref 12.0–15.0)
LYMPHS ABS: 2.4 10*3/uL (ref 0.7–4.0)
Lymphocytes Relative: 10 %
MCH: 31.3 pg (ref 26.0–34.0)
MCHC: 32.5 g/dL (ref 30.0–36.0)
MCV: 96.3 fL (ref 78.0–100.0)
MONOS PCT: 5 %
Monocytes Absolute: 1.3 10*3/uL — ABNORMAL HIGH (ref 0.1–1.0)
NEUTROS PCT: 85 %
Neutro Abs: 20.8 10*3/uL — ABNORMAL HIGH (ref 1.7–7.7)
PLATELETS: 537 10*3/uL — AB (ref 150–400)
RBC: 3.2 MIL/uL — ABNORMAL LOW (ref 3.87–5.11)
RDW: 14.4 % (ref 11.5–15.5)
WBC: 24.7 10*3/uL — AB (ref 4.0–10.5)

## 2015-04-30 LAB — COMPREHENSIVE METABOLIC PANEL
ALBUMIN: 2.8 g/dL — AB (ref 3.5–5.0)
ALT: 11 U/L — ABNORMAL LOW (ref 14–54)
ANION GAP: 7 (ref 5–15)
AST: 20 U/L (ref 15–41)
Alkaline Phosphatase: 85 U/L (ref 38–126)
BUN: 19 mg/dL (ref 6–20)
CALCIUM: 9.1 mg/dL (ref 8.9–10.3)
CHLORIDE: 101 mmol/L (ref 101–111)
CO2: 26 mmol/L (ref 22–32)
Creatinine, Ser: 1.32 mg/dL — ABNORMAL HIGH (ref 0.44–1.00)
GFR calc non Af Amer: 35 mL/min — ABNORMAL LOW (ref >60)
GFR, EST AFRICAN AMERICAN: 41 mL/min — AB (ref >60)
GLUCOSE: 95 mg/dL (ref 65–99)
POTASSIUM: 4.6 mmol/L (ref 3.5–5.1)
SODIUM: 134 mmol/L — AB (ref 135–145)
Total Bilirubin: 0.3 mg/dL (ref 0.3–1.2)
Total Protein: 6.8 g/dL (ref 6.5–8.1)

## 2015-04-30 LAB — TYPE AND SCREEN
ABO/RH(D): O POS
ANTIBODY SCREEN: NEGATIVE

## 2015-04-30 LAB — URINALYSIS, ROUTINE W REFLEX MICROSCOPIC
Glucose, UA: NEGATIVE mg/dL
KETONES UR: 15 mg/dL — AB
Nitrite: NEGATIVE
Protein, ur: 30 mg/dL — AB
SPECIFIC GRAVITY, URINE: 1.026 (ref 1.005–1.030)
pH: 5.5 (ref 5.0–8.0)

## 2015-04-30 LAB — URINE MICROSCOPIC-ADD ON

## 2015-04-30 LAB — POC OCCULT BLOOD, ED: Fecal Occult Bld: NEGATIVE

## 2015-04-30 LAB — ABO/RH: ABO/RH(D): O POS

## 2015-04-30 LAB — I-STAT CG4 LACTIC ACID, ED: LACTIC ACID, VENOUS: 1.12 mmol/L (ref 0.5–2.0)

## 2015-04-30 MED ORDER — IOHEXOL 300 MG/ML  SOLN
100.0000 mL | Freq: Once | INTRAMUSCULAR | Status: AC | PRN
  Administered 2015-04-30: 80 mL via INTRAVENOUS

## 2015-04-30 MED ORDER — PIPERACILLIN-TAZOBACTAM 3.375 G IVPB
3.3750 g | Freq: Once | INTRAVENOUS | Status: AC
  Administered 2015-05-01: 3.375 g via INTRAVENOUS
  Filled 2015-04-30: qty 50

## 2015-04-30 MED ORDER — HYDROXYZINE HCL 25 MG PO TABS
25.0000 mg | ORAL_TABLET | Freq: Once | ORAL | Status: AC
  Administered 2015-04-30: 25 mg via ORAL
  Filled 2015-04-30: qty 1

## 2015-04-30 MED ORDER — POLYVINYL ALCOHOL 1.4 % OP SOLN
1.0000 [drp] | OPHTHALMIC | Status: DC | PRN
  Administered 2015-05-01: 1 [drp] via OPHTHALMIC
  Filled 2015-04-30: qty 15

## 2015-04-30 MED ORDER — IOHEXOL 300 MG/ML  SOLN
25.0000 mL | Freq: Once | INTRAMUSCULAR | Status: AC | PRN
  Administered 2015-04-30: 25 mL via ORAL

## 2015-04-30 MED ORDER — DIPHENHYDRAMINE HCL 50 MG/ML IJ SOLN
12.5000 mg | Freq: Once | INTRAMUSCULAR | Status: AC
  Administered 2015-04-30: 12.5 mg via INTRAVENOUS
  Filled 2015-04-30: qty 1

## 2015-04-30 MED ORDER — LORAZEPAM 2 MG/ML IJ SOLN
1.0000 mg | Freq: Once | INTRAMUSCULAR | Status: AC
  Administered 2015-04-30: 1 mg via INTRAVENOUS
  Filled 2015-04-30: qty 1

## 2015-04-30 MED ORDER — SODIUM CHLORIDE 0.9 % IV BOLUS (SEPSIS)
1000.0000 mL | Freq: Once | INTRAVENOUS | Status: AC
  Administered 2015-04-30: 1000 mL via INTRAVENOUS

## 2015-04-30 MED ORDER — SODIUM CHLORIDE 0.9 % IV SOLN
INTRAVENOUS | Status: AC
  Administered 2015-05-01: 125 mL/h via INTRAVENOUS

## 2015-04-30 NOTE — ED Notes (Signed)
Per [facility] pt from home, co dark tarry stools and bright red  Rectal/vaginal bleeding. Hx colon cancer, yet pt did not receive any treatment due to personal choice. Son at bedside and is the medical power of attorney . Pt denies abd pain nor dizziness yet reports weakness.

## 2015-04-30 NOTE — ED Notes (Signed)
Nurse drawing labs. 

## 2015-04-30 NOTE — ED Notes (Signed)
Attempted to insert in and out cathter to obtain urine sample. There was no urine output. MD made aware and suggested  1 L NS then redo in and out. Lauren, RN witnessed cathter insertion.

## 2015-04-30 NOTE — ED Notes (Signed)
Patient stated the benadryl did not work. Patient is complaining of itching on her abdomen area and this is making her not be able to be still.

## 2015-04-30 NOTE — ED Provider Notes (Addendum)
CSN: [id number]     Arrival date & time 04/30/15  1328 History   First MD Initiated Contact with Patient 04/30/15 1459     Chief Complaint  Patient presents with  . Rectal Bleeding     (Consider location/radiation/quality/duration/timing/severity/associated sxs/prior Treatment) HPI Comments: Patient is an 81 year old female with a history of colon versus ovarian cancer, rectovaginal fistula with chronic brown discharge, stroke on Lovenox 80 mg daily presents today with bleeding either from the rectum or vagina. Patient has noticed after the last 3 or 4 days she has had dark black stool with occasional bright red blood present. Today her sister changed her pad and found significant amount of blood but was unsure if it was coming in the vagina or the rectum. Patient has been very weak and tired. She has noticed some increased shortness of breath with exertion but denies chest pain or abdominal pain. She is urinating normally. Occasionally she will feel chilled but has had no documented fever. She has currently chose to not undergo surgery, colonoscopy for chemotherapy.  Patient is a 81 y.o. female presenting with hematochezia. The history is provided by the patient and a relative.  Rectal Bleeding Quality:  Bright red Amount:  Moderate Duration:  2 weeks Timing:  Intermittent Progression:  Worsening Chronicity:  New Context: spontaneously   Context: not rectal pain   Similar prior episodes: no   Relieved by:  None tried Worsened by:  Nothing tried Ineffective treatments:  None tried Associated symptoms: no abdominal pain, no dizziness, no fever, no hematemesis, no light-headedness, no loss of consciousness and no vomiting   Risk factors: anticoagulant use and hx of colorectal cancer     Past Medical History  Diagnosis Date  . Spinal stenosis   . Basal cell carcinoma of nose 2005  . Squamous cell carcinoma of left hip 2005  . Colon cancer ([place]) dx'd 02/16/15  . Glaucoma   . Macular  degeneration   . Stroke [facility])    Past Surgical History  Procedure Laterality Date  . Breast biopsy  1950's ; 1952 ; 1954    x 3 - Benign  . Tonsillectomy    . Flexible sigmoidoscopy N/A 02/15/2015    Procedure: FLEXIBLE SIGMOIDOSCOPY;  Surgeon: Ronald Lobo, MD;  Location: [facility];  Service: Endoscopy;  Laterality: N/A;   Family History  Problem Relation Age of Onset  . Breast cancer Maternal Aunt   . Ovarian cancer Sister   . Heart disease Father   . Ulcers Father   . COPD Mother    Social History  Substance Use Topics  . Smoking status: Never Smoker   . Smokeless tobacco: Never Used  . Alcohol Use: 4.2 oz/week    7 Glasses of wine per week     Comment: occasionally   OB History    Gravida Para Term Preterm AB TAB SAB Ectopic Multiple Living   '3 3 3  '$ 0     3     Review of Systems  Constitutional: Negative for fever.  Gastrointestinal: Positive for hematochezia. Negative for vomiting, abdominal pain and hematemesis.  Neurological: Negative for dizziness, loss of consciousness and light-headedness.  All other systems reviewed and are negative.     Allergies  Lyrica  Home Medications   Prior to Admission medications   Medication Sig Start Date End Date Taking? Authorizing Provider  acetaminophen (TYLENOL) 500 MG tablet Take 1 tablet (500 mg total) by mouth 2 (two) times daily with breakfast and lunch. 03/01/15  Yes Ivan Anchors Love, PA-C  ASPIRIN LOW DOSE 81 MG EC tablet Take 81 mg by mouth daily. 04/23/15  Yes Historical Provider, MD  cycloSPORINE (RESTASIS) 0.05 % ophthalmic emulsion Place 1 drop into both eyes 2 (two) times daily.   Yes Historical Provider, MD  diphenhydrAMINE (BENADRYL) 12.5 MG/5ML elixir Take 5 mLs (12.5 mg total) by mouth every 8 (eight) hours as needed for itching. 02/22/15  Yes Nita Sells, MD  docusate sodium (COLACE) 100 MG capsule Take 2 capsules (200 mg total) by mouth at bedtime. 03/01/15  Yes Ivan Anchors Love, PA-C  enoxaparin  (LOVENOX) 80 MG/0.8ML injection Inject 0.8 mLs (80 mg total) into the skin daily. 04/23/15  Yes Rosalin Hawking, MD  feeding supplement, ENSURE ENLIVE, (ENSURE ENLIVE) LIQD Take 237 mLs by mouth 2 (two) times daily between meals. 02/22/15  Yes Nita Sells, MD  fluticasone (FLONASE) 50 MCG/ACT nasal spray Place 1 spray into both nostrils daily. 03/01/15  Yes Ivan Anchors Love, PA-C  HYDROcodone-acetaminophen (NORCO/VICODIN) 5-325 MG tablet Take 1 tablet by mouth every 6 (six) hours as needed for moderate pain.   Yes Historical Provider, MD  hydrOXYzine (ATARAX/VISTARIL) 10 MG tablet Take 10 mg by mouth 3 (three) times daily as needed for itching.  04/26/15  Yes Historical Provider, MD  montelukast (SINGULAIR) 10 MG tablet Take 10 mg by mouth at bedtime. 01/25/15  Yes Historical Provider, MD  Multiple Vitamins-Minerals (EYE VITAMINS PO) Take 1 tablet by mouth daily.    Yes Historical Provider, MD  ondansetron (ZOFRAN) 4 MG tablet Take 1 tablet (4 mg total) by mouth every 6 (six) hours as needed for nausea. 03/01/15  Yes Ivan Anchors Love, PA-C  polyethylene glycol (MIRALAX / GLYCOLAX) packet Take 17 g by mouth daily. 03/01/15  Yes Ivan Anchors Love, PA-C  polyvinyl alcohol (LIQUIFILM TEARS) 1.4 % ophthalmic solution Place 1 drop into both eyes as needed for dry eyes. 02/22/15  Yes Nita Sells, MD  saccharomyces boulardii (FLORASTOR) 250 MG capsule Take 1 capsule (250 mg total) by mouth 2 (two) times daily. 03/01/15  Yes Ivan Anchors Love, PA-C  senna (SENOKOT) 8.6 MG TABS tablet Take 1 tablet by mouth daily.    Yes Historical Provider, MD  triamcinolone cream (KENALOG) 0.1 % Apply 1 application topically 2 (two) times daily. 04/26/15  Yes Historical Provider, MD  Turmeric 500 MG CAPS Take 1 tablet by mouth daily.    Yes Historical Provider, MD   BP 105/55 mmHg  Pulse 93  Temp(Src) 97.2 F (36.2 C) (Oral)  Resp 21  SpO2 94% Physical Exam  Constitutional: She is oriented to person, place, and time. She appears  well-developed. She appears cachectic. No distress.  HENT:  Head: Normocephalic and atraumatic.  Eyes: EOM are normal. Pupils are equal, round, and reactive to light.  Cardiovascular: Normal rate, regular rhythm, normal heart sounds and intact distal pulses.  Exam reveals no friction rub.   No murmur heard. Pulmonary/Chest: Effort normal and breath sounds normal. She has no wheezes. She has no rales.  Abdominal: Soft. Bowel sounds are normal. She exhibits no distension. There is tenderness in the suprapubic area. There is no rebound and no guarding.  Genitourinary: Rectum normal. Rectal exam shows no external hemorrhoid, no internal hemorrhoid, no mass and no tenderness. Guaiac negative stool. There is bleeding in the vagina. Vaginal discharge found.  Stool is dark in color  Musculoskeletal: Normal range of motion. She exhibits no tenderness.  No edema  Neurological: She is alert and oriented  to person, place, and time. No cranial nerve deficit.  Skin: Skin is warm and dry. Rash noted.  Raised red papules over the abdomen that are excoriated  Psychiatric: She has a normal mood and affect. Her behavior is normal.  Nursing note and vitals reviewed.   ED Course  Procedures (including critical care time) Labs Review Labs Reviewed  CBC WITH DIFFERENTIAL/PLATELET - Abnormal; Notable for the following:    WBC 24.7 (*)    RBC 3.20 (*)    Hemoglobin 10.0 (*)    HCT 30.8 (*)    Platelets 537 (*)    Neutro Abs 20.8 (*)    Monocytes Absolute 1.3 (*)    All other components within normal limits  COMPREHENSIVE METABOLIC PANEL - Abnormal; Notable for the following:    Sodium 134 (*)    Creatinine, Ser 1.32 (*)    Albumin 2.8 (*)    ALT 11 (*)    GFR calc non Af Amer 35 (*)    GFR calc Af Amer 41 (*)    All other components within normal limits  URINALYSIS, ROUTINE W REFLEX MICROSCOPIC (NOT AT [facility]) - Abnormal; Notable for the following:    Color, Urine AMBER (*)    APPearance CLOUDY (*)     Hgb urine dipstick SMALL (*)    Bilirubin Urine SMALL (*)    Ketones, ur 15 (*)    Protein, ur 30 (*)    Leukocytes, UA TRACE (*)    All other components within normal limits  URINE MICROSCOPIC-ADD ON - Abnormal; Notable for the following:    Squamous Epithelial / LPF 0-5 (*)    Bacteria, UA FEW (*)    Casts HYALINE CASTS (*)    All other components within normal limits  POC OCCULT BLOOD, ED  I-STAT CG4 LACTIC ACID, ED  TYPE AND SCREEN  ABO/RH    Imaging Review Dg Chest 2 View  04/30/2015  CLINICAL DATA:  Shortness of breath for 1 week.  Fatigue. EXAM: CHEST  2 VIEW COMPARISON:  02/22/2015 FINDINGS: The patient is rotated to the right on today's radiograph, reducing diagnostic sensitivity and specificity. Mild enlargement of the cardiopericardial silhouette without edema. No pleural effusion. Calcified mitral valve. Atherosclerotic abdominal aorta.  Thoracic spondylosis. IMPRESSION: 1. Stable mild enlargement of the cardiopericardial silhouette, without edema. Calcified mitral valve. 2. Thoracic spondylosis. 3. No acute findings. Electronically Signed   By: Van Clines M.D.   On: 04/30/2015 18:21   Ct Abdomen Pelvis W Contrast  04/30/2015  CLINICAL DATA:  Abdominal pain, leukocytosis, rectal and vaginal bleeding, history of colon cancer EXAM: CT ABDOMEN AND PELVIS WITH CONTRAST TECHNIQUE: Multidetector CT imaging of the abdomen and pelvis was performed using the standard protocol following bolus administration of intravenous contrast. CONTRAST:  20m OMNIPAQUE IOHEXOL 300 MG/ML SOLN, 880mOMNIPAQUE IOHEXOL 300 MG/ML SOLN COMPARISON:  02/13/2015 FINDINGS: Sagittal images of the spine shows degenerative changes lumbar spine. Mild for valve calcifications are again noted. Extensive atherosclerotic calcifications of abdominal aorta, splenic artery and bilateral iliac arteries. No aortic aneurysm. There is progression in size of low-density lesion in left hepatic lobe measures 3 cm. This  is highly suspicious for metastatic disease. A second lesion in left hepatic lobe laterally measures 1.6 cm. No calcified gallstones are noted within gallbladder. The pancreas, spleen and adrenal glands are unremarkable. There is mild left hydronephrosis and left hydroureter. Mild delay excretion of the left ureter suspicious for mild obstructive uropathy. No small bowel obstruction.  No free  abdominal air. There is no pericecal inflammation. The terminal ileum is unremarkable. Again noted irregular mass in the sigmoid colon measures at least 7.5 cm in length. In the lateral aspect of the mass there is a collection with air-fluid level measures about 2.8 cm. This is highly suspicious for necrobiosis or abscess. The mass is in close proximity with the vagina. I cannot exclude a fistulous communication. Correlation with barium enema and GYN exam is recommended. The urinary bladder is unremarkable. Multiple sigmoid colon diverticula are noted. Again noted a cyst in upper pole of the left kidney medial aspect measures 2.4 cm. There is a third lesion in left hepatic lobe axial image 21 measures 1 cm. IMPRESSION: 1. Again noted irregular mass in the sigmoid colon measures at least 7.5 cm in length. In the lateral aspect of the mass there is a collection with air-fluid level measures about 2.8 cm. This is highly suspicious for necrobiosis or abscess. The mass is in close proximity with the vagina. I cannot exclude a fistulous communication. Correlation with barium enema and GYN exam is recommended. 2. There is mild left hydronephrosis and left hydroureter. Mild delayed excretion on the left ureter. 3. Progression in size of metastatic lesion in left hepatic lobe measures 3 cm. There is probable a second lesion left hepatic lobe measures 1.6 cm. Additional lesion in left hepatic lobe measures 1 cm. 4. No small bowel obstruction. These results were called by telephone at the time of interpretation on 04/30/2015 at 10:44 pm  to Dr. Blanchie Dessert , who verbally acknowledged these results. Electronically Signed   By: Lahoma Crocker M.D.   On: 04/30/2015 22:44   I have personally reviewed and evaluated these images and lab results as part of my medical decision-making.   EKG Interpretation   Date/Time:  Friday April 30 2015 13:59:20 EST Ventricular Rate:  93 PR Interval:  147 QRS Duration: 81 QT Interval:  354 QTC Calculation: 440 R Axis:   -19 Text Interpretation:  Sinus rhythm Borderline left axis deviation Low  voltage, precordial leads , new Nonspecific T wave abnormality Confirmed  by Maryan Rued  MD, Loree Fee ([id number]) on 04/30/2015 3:00:47 PM      MDM   Final diagnoses:  Intra-abdominal abscess ([place])  Mass in the abdomen    Patient is an 81 year old female presenting today with generalized weakness, fatigue and blood per the rectum or vagina. She has a history of a questionable colon versus ovarian cancer and a rectovaginal fistula. Currently she is not getting any treatment for this however she now is having dark stools with some bright red blood. She denies any localized abdominal pain or urinary problems however on exam she has some midline suprapubic tenderness. She is awake and alert and able to answer questions appropriately. She is currently on Lovenox due to a stroke 2 months ago.  No prior vaginal or rectal bleeding until 2 weeks ago. It has been intermittent but today was worse.  On exam the bleeding appears to be coming from the vaginal area however unclear if it's from the uterus versus the fistula. Rectal exam with dark stool but no hemorrhoids or bright red blood noted.  EKG with new nonspecific T-wave inversion in anterior leads. Chest x-ray, CBC, CMP, type and cross, UA, Hemoccult, lactic acid  Pending.  Patient given IV fluids as she has had poor by mouth intake and family is concerned she is dehydrated.  Currently hemodynamically stable.  4:58 PM Patient with new AK I with a  creatinine  that has doubled, new leukocytosis of 24,000 but a normal lactate and negative Hemoccult. Hemoglobin is stable at 10 with a normal BUN. Patient given IV bolus fluid will attempt to get a urine however first in and out cath had no urine.  11:28 PM Pt showing evidence of a developing abscess near the mass which could be necrosis versus just abscess. With patient's new leukocytosis of 24,000 concern for abscess. Discussed with general surgery who at this time recommended discussion with IR in the morning for possible drain. Patient started on Zosyn. Patient will be admitted to medicine.  Blanchie Dessert, MD 04/30/15 [id number]  Blanchie Dessert, MD 04/30/15 [id number]

## 2015-04-30 NOTE — Consult Note (Signed)
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 female.  HPI:  Patient is an 81 year old female who presented to the emergency department with increased weakness and lethargy. 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 The patient was set up to have a resection and had a CVA the night before. Surgery was canceled. Patient has seen Dr. Georgiann Cocker in [place] in addition to Dr. Earlie Server at the cancer center. Dr. Hassell Done is the surgeon here that was seeing Katherine Cooke and Katherine Cooke saw Dr. Alford Highland of colorectal surgery at [facility].    Katherine Cooke is tentatively planning to not pursue treatment and to elect comfort measures. Katherine Cooke Cooke. Katherine Cooke Cooke.  Katherine Cooke Cooke. Katherine Cooke appetite has also decreased and Katherine Cooke ability to stay hydrated appeared to be compromised recently. Katherine Cooke was found to have a white count of 24.7 upon arrival. Katherine Cooke creatinine also bumped to 1.3.  This is approximately double Katherine Cooke baseline creatinine.  One thing that is definitely different is that Katherine Cooke Cooke. Katherine Cooke has described it in the past as "like fire". Katherine Cooke does not have visible rash. They have tried multiple creams to no avail. Katherine Cooke has been compliant with Katherine Cooke Miralax regimen twice a day.  Katherine Cooke has not had fever/chills.    Past Medical History  Diagnosis Date  . Spinal stenosis   . Basal cell carcinoma of nose 2005  . Squamous cell carcinoma of left hip 2005  . Colon cancer ([place]) dx'd 02/16/15  . Glaucoma   . Macular degeneration   . Stroke [name]     Past Surgical History  Procedure Laterality Date  . Breast biopsy  1950's ; 1952 ; 1954    x 3 - Benign  . Tonsillectomy    . Flexible  sigmoidoscopy N/A 02/15/2015    Procedure: FLEXIBLE SIGMOIDOSCOPY;  Surgeon: Ronald Lobo, MD;  Location: [facility] END[facility];  Service: Endoscopy;  Laterality: N/A;    Family History  Problem Relation Age of Onset  . Breast cancer Maternal Aunt   . Ovarian cancer Sister   . Heart disease Father   . Ulcers Father   . COPD Mother     Social History:  reports that Katherine Cooke has never smoked. Katherine Cooke has never used smokeless tobacco. Katherine Cooke reports that Katherine Cooke drinks about 4.2 oz of alcohol per week. Katherine Cooke reports that Katherine Cooke does not use illicit drugs.  Allergies:  Allergies  Allergen Reactions  . Lyrica [Pregabalin] Shortness Of Breath    Medications:  I have reviewed the patient's current medications. Prior to Admission:  Prescriptions prior to admission  Medication Sig Dispense Refill Last Dose  . acetaminophen (TYLENOL) 500 MG tablet Take 1 tablet (500 mg total) by mouth 2 (two) times daily with breakfast and lunch. 30 tablet 0 04/30/2015 at Unknown time  . ASPIRIN LOW DOSE 81 MG EC tablet Take 81 mg by mouth daily.  11 04/29/2015 at Unknown time  . cycloSPORINE (RESTASIS) 0.05 % ophthalmic emulsion Place 1 drop into both eyes 2 (two) times daily.   04/29/2015 at Unknown time  . diphenhydrAMINE (BENADRYL) 12.5 MG/5ML elixir Take 5 mLs (12.5 mg total) by mouth every 8 (eight) hours as needed for itching. 120 mL 0 04/30/2015 at  Unknown time  . docusate sodium (COLACE) 100 MG capsule Take 2 capsules (200 mg total) by mouth at bedtime. 10 capsule 0 04/30/2015 at Unknown time  . enoxaparin (LOVENOX) 80 MG/0.8ML injection Inject 0.8 mLs (80 mg total) into the skin daily. 30 Syringe 2 04/30/2015 at 1000  . feeding supplement, ENSURE ENLIVE, (ENSURE ENLIVE) LIQD Take 237 mLs by mouth 2 (two) times daily between meals. 237 mL 12 04/30/2015 at Unknown time  . fluticasone (FLONASE) 50 MCG/ACT nasal spray Place 1 spray into both nostrils daily.  2 04/30/2015 at Unknown time  . HYDROcodone-acetaminophen (NORCO/VICODIN)  5-325 MG tablet Take 1 tablet by mouth every 6 (six) hours as needed for moderate pain.   Past Week at Unknown time  . hydrOXYzine (ATARAX/VISTARIL) 10 MG tablet Take 10 mg by mouth 3 (three) times daily as needed for itching.   2 04/30/2015 at Unknown time  . montelukast (SINGULAIR) 10 MG tablet Take 10 mg by mouth at bedtime.  3 04/29/2015 at Unknown time  . Multiple Vitamins-Minerals (EYE VITAMINS PO) Take 1 tablet by mouth daily.    04/30/2015 at Unknown time  . ondansetron (ZOFRAN) 4 MG tablet Take 1 tablet (4 mg total) by mouth every 6 (six) hours as needed for nausea. 20 tablet 0 Past Week at Unknown time  . polyethylene glycol (MIRALAX / GLYCOLAX) packet Take 17 g by mouth daily. 14 each 0 04/30/2015 at Unknown time  . polyvinyl alcohol (LIQUIFILM TEARS) 1.4 % ophthalmic solution Place 1 drop into both eyes as needed for dry eyes. 15 mL 0 04/30/2015 at Unknown time  . saccharomyces boulardii (FLORASTOR) 250 MG capsule Take 1 capsule (250 mg total) by mouth 2 (two) times daily.   04/30/2015 at Unknown time  . senna (SENOKOT) 8.6 MG TABS tablet Take 1 tablet by mouth daily.    04/30/2015 at Unknown time  . triamcinolone cream (KENALOG) 0.1 % Apply 1 application topically 2 (two) times daily.  0 04/30/2015 at Unknown time  . Turmeric 500 MG CAPS Take 1 tablet by mouth daily.    04/30/2015 at Unknown time    Results for orders placed or performed during the hospital encounter of 04/30/15 (from the past 48 hour(s))  CBC with Differential/Platelet     Status: Abnormal   Collection Time: 04/30/15  3:20 PM  Result Value Ref Range   WBC 24.7 (H) 4.0 - 10.5 K/uL   RBC 3.20 (L) 3.87 - 5.11 MIL/uL   Hemoglobin 10.0 (L) 12.0 - 15.0 g/dL   HCT 30.8 (L) 36.0 - 46.0 %   MCV 96.3 78.0 - 100.0 fL   MCH 31.3 26.0 - 34.0 pg   MCHC 32.5 30.0 - 36.0 g/dL   RDW 14.4 11.5 - 15.5 %   Platelets 537 (H) 150 - 400 K/uL   Neutrophils Relative % 85 %   Neutro Abs 20.8 (H) 1.7 - 7.7 K/uL   Lymphocytes Relative  10 %   Lymphs Abs 2.4 0.7 - 4.0 K/uL   Monocytes Relative 5 %   Monocytes Absolute 1.3 (H) 0.1 - 1.0 K/uL   Eosinophils Relative 0 %   Eosinophils Absolute 0.1 0.0 - 0.7 K/uL   Basophils Relative 0 %   Basophils Absolute 0.1 0.0 - 0.1 K/uL  Comprehensive metabolic panel     Status: Abnormal   Collection Time: 04/30/15  3:20 PM  Result Value Ref Range   Sodium 134 (L) 135 - 145 mmol/L   Potassium 4.6 3.5 - 5.1 mmol/L  Chloride 101 101 - 111 mmol/L   CO2 26 22 - 32 mmol/L   Glucose, Bld 95 65 - 99 mg/dL   BUN 19 6 - 20 mg/dL   Creatinine, Ser 1.32 (H) 0.44 - 1.00 mg/dL   Calcium 9.1 8.9 - 10.3 mg/dL   Total Protein 6.8 6.5 - 8.1 g/dL   Albumin 2.8 (L) 3.5 - 5.0 g/dL   AST 20 15 - 41 U/L   ALT 11 (L) 14 - 54 U/L   Alkaline Phosphatase 85 38 - 126 U/L   Total Bilirubin 0.3 0.3 - 1.2 mg/dL   GFR calc non Af Amer 35 (L) >60 mL/min   GFR calc Af Amer 41 (L) >60 mL/min    Comment: (NOTE) The eGFR has been calculated using the CKD EPI equation. This calculation has not been validated in all clinical situations. eGFR's persistently <60 mL/min signify possible Chronic Kidney Disease.    Anion gap 7 5 - 15  Type and screen     Status: None   Collection Time: 04/30/15  3:20 PM  Result Value Ref Range   ABO/RH(D) O POS    Antibody Screen NEG    Sample Expiration 05/03/2015   ABO/Rh     Status: None   Collection Time: 04/30/15  3:20 PM  Result Value Ref Range   ABO/RH(D) O POS   POC occult blood, ED Provider will collect     Status: None   Collection Time: 04/30/15  3:26 PM  Result Value Ref Range   Fecal Occult Bld NEGATIVE NEGATIVE  I-Stat CG4 Lactic Acid, ED     Status: None   Collection Time: 04/30/15  3:37 PM  Result Value Ref Range   Lactic Acid, Venous 1.12 0.5 - 2.0 mmol/L  Urinalysis, Routine w reflex microscopic (not at [facility])     Status: Abnormal   Collection Time: 04/30/15  6:59 PM  Result Value Ref Range   Color, Urine AMBER (A) YELLOW    Comment: BIOCHEMICALS  MAY BE AFFECTED BY COLOR   APPearance CLOUDY (A) CLEAR   Specific Gravity, Urine 1.026 1.005 - 1.030   pH 5.5 5.0 - 8.0   Glucose, UA NEGATIVE NEGATIVE mg/dL   Hgb urine dipstick SMALL (A) NEGATIVE   Bilirubin Urine SMALL (A) NEGATIVE   Ketones, ur 15 (A) NEGATIVE mg/dL   Protein, ur 30 (A) NEGATIVE mg/dL   Nitrite NEGATIVE NEGATIVE   Leukocytes, UA TRACE (A) NEGATIVE  Urine microscopic-add on     Status: Abnormal   Collection Time: 04/30/15  6:59 PM  Result Value Ref Range   Squamous Epithelial / LPF 0-5 (A) NONE SEEN    Comment: Please note change in reference range.   WBC, UA 6-30 0 - 5 WBC/hpf    Comment: Please note change in reference range.   RBC / HPF 0-5 0 - 5 RBC/hpf    Comment: Please note change in reference range.   Bacteria, UA FEW (A) NONE SEEN    Comment: Please note change in reference range.   Casts HYALINE CASTS (A) NEGATIVE   Urine-Other AMORPHOUS URATES/PHOSPHATES     Comment: MUCOUS PRESENT    Dg Chest 2 View  04/30/2015  CLINICAL DATA:  Shortness of breath for 1 week.  Fatigue. EXAM: CHEST  2 VIEW COMPARISON:  02/22/2015 FINDINGS: The patient is rotated to the right on Katherine Cooke's radiograph, reducing diagnostic sensitivity and specificity. Mild enlargement of the cardiopericardial silhouette without edema. No pleural effusion. Calcified mitral valve. Atherosclerotic  abdominal aorta.  Thoracic spondylosis. IMPRESSION: 1. Stable mild enlargement of the cardiopericardial silhouette, without edema. Calcified mitral valve. 2. Thoracic spondylosis. 3. No acute findings. Electronically Signed   By: Van Clines M.D.   On: 04/30/2015 18:21   Ct Abdomen Pelvis W Contrast  04/30/2015  CLINICAL DATA:  Abdominal pain, leukocytosis, rectal and vaginal bleeding, history of colon cancer EXAM: CT ABDOMEN AND PELVIS WITH CONTRAST TECHNIQUE: Multidetector CT imaging of the abdomen and pelvis was performed using the standard protocol following bolus administration of  intravenous contrast. CONTRAST:  56mL OMNIPAQUE IOHEXOL 300 MG/ML SOLN, 15mL OMNIPAQUE IOHEXOL 300 MG/ML SOLN COMPARISON:  02/13/2015 FINDINGS: Sagittal images of the spine shows degenerative changes lumbar spine. Mild for valve calcifications are again noted. Extensive atherosclerotic calcifications of abdominal aorta, splenic artery and bilateral iliac arteries. No aortic aneurysm. There is progression in size of low-density lesion in left hepatic lobe measures 3 cm. This is highly suspicious for metastatic disease. A second lesion in left hepatic lobe laterally measures 1.6 cm. No calcified gallstones are noted within gallbladder. The pancreas, spleen and adrenal glands are unremarkable. There is mild left hydronephrosis and left hydroureter. Mild delay excretion of the left ureter suspicious for mild obstructive uropathy. No small bowel obstruction.  No free abdominal air. There is no pericecal inflammation. The terminal ileum is unremarkable. Again noted irregular mass in the sigmoid colon measures at least 7.5 cm in length. In the lateral aspect of the mass there is a collection with air-fluid level measures about 2.8 cm. This is highly suspicious for necrobiosis or abscess. The mass is in close proximity with the vagina. I cannot exclude a fistulous communication. Correlation with barium enema and GYN exam is recommended. The urinary bladder is unremarkable. Multiple sigmoid colon diverticula are noted. Again noted a cyst in upper pole of the left kidney medial aspect measures 2.4 cm. There is a third lesion in left hepatic lobe axial image 21 measures 1 cm. IMPRESSION: 1. Again noted irregular mass in the sigmoid colon measures at least 7.5 cm in length. In the lateral aspect of the mass there is a collection with air-fluid level measures about 2.8 cm. This is highly suspicious for necrobiosis or abscess. The mass is in close proximity with the vagina. I cannot exclude a fistulous communication. Correlation  with barium enema and GYN exam is recommended. 2. There is mild left hydronephrosis and left hydroureter. Mild delayed excretion on the left ureter. 3. Progression in size of metastatic lesion in left hepatic lobe measures 3 cm. There is probable a second lesion left hepatic lobe measures 1.6 cm. Additional lesion in left hepatic lobe measures 1 cm. 4. No small bowel obstruction. These results were called by telephone at the time of interpretation on 04/30/2015 at 10:44 pm to Dr. Blanchie Dessert , who verbally acknowledged these results. Electronically Signed   By: Lahoma Crocker M.D.   On: 04/30/2015 22:44    Review of Systems  Constitutional: Positive for weight loss and malaise/fatigue. Negative for fever and chills.  Eyes: Negative.   Respiratory: Negative.   Cardiovascular: Negative.   Gastrointestinal: Positive for abdominal pain (occasional need for vicodin.) and constipation (has been ok since Katherine Cooke has been on BID miralax.). Negative for diarrhea.  Genitourinary: Positive for dysuria.       History of dark vaginal Cooke.  Had bloody Cooke Katherine Cooke.    Skin: Positive for itching (complains of "fire" across abdomen).  Neurological: Positive for weakness.  Only improvement since stroke in September.    Psychiatric/Behavioral: Negative.    Blood pressure 101/51, pulse 92, temperature 98.7 F (37.1 C), temperature source Oral, resp. rate 23, SpO2 92 %. Physical Exam  Constitutional: Katherine Cooke appears well-developed and well-nourished.  Sleeping, just got ativan for itching.  HENT:  Head: Normocephalic and atraumatic.  Eyes: Conjunctivae are normal.  Neck: Neck supple.  Cardiovascular: Normal rate, regular rhythm and intact distal pulses.   Respiratory: Effort normal. No respiratory distress.  GI: Soft. Katherine Cooke exhibits no distension and no mass. There is no tenderness.  Neurological: Coordination normal.  Skin: Skin is warm and dry. No rash noted. Katherine Cooke is not diaphoretic. No erythema. No  pallor.  Psychiatric: Katherine Cooke has a normal mood and affect. Katherine Cooke behavior is normal. Judgment and thought content normal.    Assessment/Plan:  Stage IV sigmoid colon cancer with partial obstruction, colovaginal fistula, and now abscess vs necrosis adjacent to tumor. Would first treat with antibiotics to see if weakness and leukocytosis resolves/improves.   Will discuss case with IR, but this is not straightforward abscess.  Could represent necrotic tumor.    Had long discussion with family about quality of life and possible options for treatment. Pt would have very difficult time with resection of colon cancer en bloc with fistula.  Would have ostomy and possible urinary/vaginal leak/abscess. High risk for bleeding or recurrent CVA with surgery.   Could have +/- laparoscopic diverting ostomy. Neurology has recommended 6 months after stroke for least risk for recurrent CVA/exacerbation of symptoms. Pt is on full dose lovenox at this point for prevention of further stroke.       Colleena Kurtenbach 04/30/2015, 11:56 PM

## 2015-05-01 DIAGNOSIS — D62 Acute posthemorrhagic anemia: Secondary | ICD-10-CM

## 2015-05-01 DIAGNOSIS — N179 Acute kidney failure, unspecified: Secondary | ICD-10-CM | POA: Diagnosis present

## 2015-05-01 DIAGNOSIS — D72829 Elevated white blood cell count, unspecified: Secondary | ICD-10-CM

## 2015-05-01 DIAGNOSIS — N321 Vesicointestinal fistula: Secondary | ICD-10-CM | POA: Diagnosis present

## 2015-05-01 DIAGNOSIS — K651 Peritoneal abscess: Secondary | ICD-10-CM

## 2015-05-01 DIAGNOSIS — L299 Pruritus, unspecified: Secondary | ICD-10-CM

## 2015-05-01 DIAGNOSIS — C189 Malignant neoplasm of colon, unspecified: Secondary | ICD-10-CM

## 2015-05-01 LAB — BASIC METABOLIC PANEL
Anion gap: 8 (ref 5–15)
BUN: 17 mg/dL (ref 6–20)
CALCIUM: 8.3 mg/dL — AB (ref 8.9–10.3)
CO2: 23 mmol/L (ref 22–32)
CREATININE: 1.01 mg/dL — AB (ref 0.44–1.00)
Chloride: 103 mmol/L (ref 101–111)
GFR calc Af Amer: 56 mL/min — ABNORMAL LOW (ref >60)
GFR calc non Af Amer: 49 mL/min — ABNORMAL LOW (ref >60)
GLUCOSE: 94 mg/dL (ref 65–99)
Potassium: 4.1 mmol/L (ref 3.5–5.1)
Sodium: 134 mmol/L — ABNORMAL LOW (ref 135–145)

## 2015-05-01 LAB — CBC
HEMATOCRIT: 28 % — AB (ref 36.0–46.0)
Hemoglobin: 9 g/dL — ABNORMAL LOW (ref 12.0–15.0)
MCH: 30.9 pg (ref 26.0–34.0)
MCHC: 32.1 g/dL (ref 30.0–36.0)
MCV: 96.2 fL (ref 78.0–100.0)
Platelets: 461 10*3/uL — ABNORMAL HIGH (ref 150–400)
RBC: 2.91 MIL/uL — ABNORMAL LOW (ref 3.87–5.11)
RDW: 14.4 % (ref 11.5–15.5)
WBC: 15.6 10*3/uL — ABNORMAL HIGH (ref 4.0–10.5)

## 2015-05-01 MED ORDER — PIPERACILLIN-TAZOBACTAM 3.375 G IVPB
3.3750 g | Freq: Three times a day (TID) | INTRAVENOUS | Status: DC
  Administered 2015-05-01 – 2015-05-06 (×16): 3.375 g via INTRAVENOUS
  Filled 2015-05-01 (×18): qty 50

## 2015-05-01 MED ORDER — MORPHINE SULFATE (PF) 2 MG/ML IV SOLN: 1.0000 mg | INTRAVENOUS | Status: DC | PRN

## 2015-05-01 MED ORDER — ACETAMINOPHEN 500 MG PO TABS
500.0000 mg | ORAL_TABLET | Freq: Two times a day (BID) | ORAL | Status: DC
  Administered 2015-05-01 – 2015-05-06 (×12): 500 mg via ORAL
  Filled 2015-05-01 (×21): qty 1

## 2015-05-01 MED ORDER — TRIAMCINOLONE ACETONIDE 0.1 % EX CREA
1.0000 "application " | TOPICAL_CREAM | Freq: Two times a day (BID) | CUTANEOUS | Status: DC
  Administered 2015-05-01 – 2015-05-06 (×11): 1 via TOPICAL
  Filled 2015-05-01: qty 15

## 2015-05-01 MED ORDER — ONDANSETRON HCL 4 MG PO TABS: 4.0000 mg | ORAL_TABLET | Freq: Four times a day (QID) | ORAL | Status: DC | PRN

## 2015-05-01 MED ORDER — TURMERIC 500 MG PO CAPS: 1.0000 | ORAL_CAPSULE | Freq: Every day | ORAL | Status: DC

## 2015-05-01 MED ORDER — ENOXAPARIN SODIUM 100 MG/ML ~~LOC~~ SOLN: 80.0000 mg | SUBCUTANEOUS | Status: DC

## 2015-05-01 MED ORDER — ASPIRIN EC 81 MG PO TBEC
81.0000 mg | DELAYED_RELEASE_TABLET | Freq: Every day | ORAL | Status: DC
  Administered 2015-05-01 – 2015-05-06 (×6): 81 mg via ORAL
  Filled 2015-05-01 (×6): qty 1

## 2015-05-01 MED ORDER — POLYETHYLENE GLYCOL 3350 17 G PO PACK
17.0000 g | PACK | Freq: Every day | ORAL | Status: DC
  Administered 2015-05-01: 17 g via ORAL
  Filled 2015-05-01: qty 1

## 2015-05-01 MED ORDER — HYDROCODONE-ACETAMINOPHEN 5-325 MG PO TABS
1.0000 | ORAL_TABLET | Freq: Four times a day (QID) | ORAL | Status: DC | PRN
  Administered 2015-05-01 – 2015-05-04 (×4): 1 via ORAL
  Filled 2015-05-01 (×4): qty 1

## 2015-05-01 MED ORDER — SODIUM CHLORIDE 0.9 % IV SOLN
INTRAVENOUS | Status: DC
  Administered 2015-05-01 – 2015-05-05 (×5): via INTRAVENOUS

## 2015-05-01 MED ORDER — POLYETHYLENE GLYCOL 3350 17 G PO PACK
17.0000 g | PACK | Freq: Every day | ORAL | Status: DC
  Administered 2015-05-01 – 2015-05-06 (×6): 17 g via ORAL
  Filled 2015-05-01 (×6): qty 1

## 2015-05-01 MED ORDER — ENSURE ENLIVE PO LIQD
237.0000 mL | Freq: Two times a day (BID) | ORAL | Status: DC
  Administered 2015-05-01 – 2015-05-03 (×4): 237 mL via ORAL

## 2015-05-01 MED ORDER — ONDANSETRON HCL 4 MG/2ML IJ SOLN: 4.0000 mg | Freq: Four times a day (QID) | INTRAMUSCULAR | Status: DC | PRN

## 2015-05-01 MED ORDER — HYDROXYZINE HCL 10 MG PO TABS
10.0000 mg | ORAL_TABLET | Freq: Three times a day (TID) | ORAL | Status: DC | PRN
  Administered 2015-05-01 – 2015-05-06 (×11): 10 mg via ORAL
  Filled 2015-05-01 (×13): qty 1

## 2015-05-01 MED ORDER — DIPHENHYDRAMINE HCL 12.5 MG/5ML PO ELIX
12.5000 mg | ORAL_SOLUTION | Freq: Three times a day (TID) | ORAL | Status: DC | PRN
  Administered 2015-05-01 – 2015-05-05 (×4): 12.5 mg via ORAL
  Filled 2015-05-01 (×4): qty 5

## 2015-05-01 MED ORDER — FLUTICASONE PROPIONATE 50 MCG/ACT NA SUSP
1.0000 | Freq: Every day | NASAL | Status: DC
  Administered 2015-05-01 – 2015-05-06 (×6): 1 via NASAL
  Filled 2015-05-01: qty 16

## 2015-05-01 MED ORDER — DOCUSATE SODIUM 100 MG PO CAPS
200.0000 mg | ORAL_CAPSULE | Freq: Every day | ORAL | Status: DC
  Administered 2015-05-01 – 2015-05-05 (×5): 200 mg via ORAL
  Filled 2015-05-01 (×6): qty 2

## 2015-05-01 MED ORDER — SENNA 8.6 MG PO TABS
1.0000 | ORAL_TABLET | Freq: Every day | ORAL | Status: DC
  Administered 2015-05-01 – 2015-05-06 (×6): 8.6 mg via ORAL
  Filled 2015-05-01 (×6): qty 1

## 2015-05-01 MED ORDER — ENOXAPARIN SODIUM 60 MG/0.6ML ~~LOC~~ SOLN: 50.0000 mg | Freq: Two times a day (BID) | SUBCUTANEOUS | Status: DC

## 2015-05-01 MED ORDER — CYCLOSPORINE 0.05 % OP EMUL
1.0000 [drp] | Freq: Two times a day (BID) | OPHTHALMIC | Status: DC
  Administered 2015-05-01 – 2015-05-06 (×11): 1 [drp] via OPHTHALMIC
  Filled 2015-05-01 (×13): qty 1

## 2015-05-01 MED ORDER — POLYVINYL ALCOHOL 1.4 % OP SOLN
1.0000 [drp] | OPHTHALMIC | Status: DC | PRN
Start: 2015-05-01 — End: 2015-05-01

## 2015-05-01 MED ORDER — ENOXAPARIN SODIUM 60 MG/0.6ML ~~LOC~~ SOLN
50.0000 mg | SUBCUTANEOUS | Status: DC
  Administered 2015-05-01: 50 mg via SUBCUTANEOUS
  Filled 2015-05-01 (×2): qty 0.6

## 2015-05-01 MED ORDER — SACCHAROMYCES BOULARDII 250 MG PO CAPS
250.0000 mg | ORAL_CAPSULE | Freq: Two times a day (BID) | ORAL | Status: DC
  Administered 2015-05-01 – 2015-05-06 (×11): 250 mg via ORAL
  Filled 2015-05-01 (×12): qty 1

## 2015-05-01 MED ORDER — MONTELUKAST SODIUM 10 MG PO TABS
10.0000 mg | ORAL_TABLET | Freq: Every day | ORAL | Status: DC
  Administered 2015-05-01 – 2015-05-05 (×5): 10 mg via ORAL
  Filled 2015-05-01 (×7): qty 1

## 2015-05-01 NOTE — H&P (Addendum)
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 They report that the bloody vaginal discharge are new and have been going on for 2 weeks now. She has tried nothing to relieve the symptoms denies any nausea or vomiting. New symptoms also include abdominal wall itching. Patient complains of abdominal wall itching across the entire daily. Not relieved with any topical creams so far. Family note significant fatigue and lethargy.  Son is present at bedside and presents most of history as patient is very sleepy and tired after receiving hydroxyzine for her abdominal itching. He notes that they had discussed getting hospice involved; however were instructed that a physician had to write an order for this. Patient was evaluated by multiple surgeons and oncology since we first diagnosed and patient had elected not to pursue any surgical treatment.Marland Kitchen Upon arrival into the emergency department the patient was checked with a CT scan showed a new air-fluid level measures about 2.8 cm adjacent to the previous sigmoid mass which was suspected to be an abscess. Surgery was consulted and recommended antibiotics and possible IR intervention if patient is amendable to this.   Review of Systems  Constitutional: Positive for malaise/fatigue.  HENT: Negative for ear discharge.   Eyes: Negative for double vision and photophobia.  Respiratory: Negative for hemoptysis and shortness of breath.   Cardiovascular: Negative for palpitations and orthopnea.  Gastrointestinal: Positive for blood in stool. Negative for nausea and vomiting.  Genitourinary: Negative for flank pain.        Blood per vagina  Musculoskeletal: Negative for back pain, falls and neck pain.  Skin: Positive for itching and rash.  Neurological: Positive for weakness. Negative for tremors, sensory change and headaches.  Endo/Heme/Allergies: Negative for environmental allergies and polydipsia.  Psychiatric/Behavioral: Negative for suicidal ideas and substance abuse.      Past Medical History  Diagnosis Date  . Spinal stenosis   . Basal cell carcinoma of nose 2005  . Squamous cell carcinoma of left hip 2005  . Colon cancer ([place]) dx'd 02/16/15  . Glaucoma   . Macular degeneration   . Stroke [facility])      Past Surgical History  Procedure Laterality Date  . Breast biopsy  1950's ; 1952 ; 1954    x 3 - Benign  . Tonsillectomy    . Flexible sigmoidoscopy N/A 02/15/2015    Procedure: FLEXIBLE SIGMOIDOSCOPY;  Surgeon: Ronald Lobo, MD;  Location: [facility];  Service: Endoscopy;  Laterality: N/A;      Social History:  reports that she has never smoked. She has never used smokeless tobacco. She reports that she drinks about 4.2 oz of alcohol per week. She reports that she does not use illicit drugs. Where does patient live--home  Can patient participate in ADLs? Yes  Allergies  Allergen Reactions  . Lyrica [Pregabalin] Shortness Of Breath    Family History  Problem Relation Age of Onset  . Breast cancer Maternal Aunt   . Ovarian cancer Sister   . Heart disease Father   . Ulcers Father   . COPD Mother        Prior to Admission medications   Medication Sig Start Date End Date Taking? Authorizing Provider  acetaminophen (  TYLENOL) 500 MG tablet Take 1 tablet (500 mg total) by mouth 2 (two) times daily with breakfast and lunch. 03/01/15  Yes Ivan Anchors Love, PA-C  ASPIRIN LOW DOSE 81 MG EC tablet Take 81 mg by mouth daily. 04/23/15  Yes Historical Provider, MD  cycloSPORINE (RESTASIS) 0.05 % ophthalmic emulsion Place 1 drop into both eyes 2 (two) times daily.   Yes Historical Provider,  MD  diphenhydrAMINE (BENADRYL) 12.5 MG/5ML elixir Take 5 mLs (12.5 mg total) by mouth every 8 (eight) hours as needed for itching. 02/22/15  Yes Nita Sells, MD  docusate sodium (COLACE) 100 MG capsule Take 2 capsules (200 mg total) by mouth at bedtime. 03/01/15  Yes Ivan Anchors Love, PA-C  enoxaparin (LOVENOX) 80 MG/0.8ML injection Inject 0.8 mLs (80 mg total) into the skin daily. 04/23/15  Yes Rosalin Hawking, MD  feeding supplement, ENSURE ENLIVE, (ENSURE ENLIVE) LIQD Take 237 mLs by mouth 2 (two) times daily between meals. 02/22/15  Yes Nita Sells, MD  fluticasone (FLONASE) 50 MCG/ACT nasal spray Place 1 spray into both nostrils daily. 03/01/15  Yes Ivan Anchors Love, PA-C  HYDROcodone-acetaminophen (NORCO/VICODIN) 5-325 MG tablet Take 1 tablet by mouth every 6 (six) hours as needed for moderate pain.   Yes Historical Provider, MD  hydrOXYzine (ATARAX/VISTARIL) 10 MG tablet Take 10 mg by mouth 3 (three) times daily as needed for itching.  04/26/15  Yes Historical Provider, MD  montelukast (SINGULAIR) 10 MG tablet Take 10 mg by mouth at bedtime. 01/25/15  Yes Historical Provider, MD  Multiple Vitamins-Minerals (EYE VITAMINS PO) Take 1 tablet by mouth daily.    Yes Historical Provider, MD  ondansetron (ZOFRAN) 4 MG tablet Take 1 tablet (4 mg total) by mouth every 6 (six) hours as needed for nausea. 03/01/15  Yes Ivan Anchors Love, PA-C  polyethylene glycol (MIRALAX / GLYCOLAX) packet Take 17 g by mouth daily. 03/01/15  Yes Ivan Anchors Love, PA-C  polyvinyl alcohol (LIQUIFILM TEARS) 1.4 % ophthalmic solution Place 1 drop into both eyes as needed for dry eyes. 02/22/15  Yes Nita Sells, MD  saccharomyces boulardii (FLORASTOR) 250 MG capsule Take 1 capsule (250 mg total) by mouth 2 (two) times daily. 03/01/15  Yes Ivan Anchors Love, PA-C  senna (SENOKOT) 8.6 MG TABS tablet Take 1 tablet by mouth daily.    Yes Historical Provider, MD  triamcinolone cream (KENALOG) 0.1 % Apply 1 application topically 2 (two)  times daily. 04/26/15  Yes Historical Provider, MD  Turmeric 500 MG CAPS Take 1 tablet by mouth daily.    Yes Historical Provider, MD     Physical Exam: Filed Vitals:   04/30/15 2100 04/30/15 2130 05/01/15 0032 05/01/15 0535  BP: 100/51 101/51 95/51 114/53  Pulse: 96 92 86 83  Temp:   97.5 F (36.4 C) 97.3 F (36.3 C)  TempSrc:   Oral Oral  Resp: '25 23 22 22  '$ Height:   '5\' 1"'$  (1.549 m)   Weight:   47.9 kg (105 lb 9.6 oz)   SpO2: 95% 92% 94% 97%     Constitutional: Vital signs reviewed. Patient is elderly and cachectic frail woman who is chronically ill appearing. She sleeps through exam. Head: Normocephalic and atraumatic  Ear: TM normal bilaterally  Mouth: no erythema or exudates, MMM  Eyes: PERRL, EOMI, conjunctivae normal, No scleral icterus.  Neck: Supple, Trachea midline normal ROM, No JVD, mass, thyromegaly, or carotid bruit present.  Cardiovascular: RRR, S1 normal, S2 normal, no MRG, pulses symmetric and intact bilaterally  Pulmonary/Chest: CTAB, no wheezes, rales, or rhonchi  Abdominal: Soft. Generalized abdominal tenderness, non-distended, bowel sounds are normal, no masses, organomegaly, or guarding present.  GU: no CVA tenderness deferred vaginal or rectal exam Musculoskeletal: No joint deformities, erythema, or stiffness, ROM full and no nontender Ext: no edema and no cyanosis, pulses palpable bilaterally (DP and PT)  Hematology: no cervical, inginal, or axillary adenopathy.  Neurological: A&O x3, Strenght is normal and symmetric bilaterally, cranial nerve II-XII are grossly intact, no focal motor deficit, sensory intact to light touch bilaterally.  Skin: Warm, dry and intact. Nonspecific rash with red papules over her entire abdomen with excoriation, cyanosis, or clubbing.  Psychiatric: Normal mood and affect. speech and behavior is normal. Judgment and thought content normal. Cognition and memory are normal.      Data Review   Micro Results No results found for  this or any previous visit (from the past 240 hour(s)).  Radiology Reports Dg Chest 2 View  04/30/2015  CLINICAL DATA:  Shortness of breath for 1 week.  Fatigue. EXAM: CHEST  2 VIEW COMPARISON:  02/22/2015 FINDINGS: The patient is rotated to the right on today's radiograph, reducing diagnostic sensitivity and specificity. Mild enlargement of the cardiopericardial silhouette without edema. No pleural effusion. Calcified mitral valve. Atherosclerotic abdominal aorta.  Thoracic spondylosis. IMPRESSION: 1. Stable mild enlargement of the cardiopericardial silhouette, without edema. Calcified mitral valve. 2. Thoracic spondylosis. 3. No acute findings. Electronically Signed   By: Van Clines M.D.   On: 04/30/2015 18:21   Ct Abdomen Pelvis W Contrast  04/30/2015  CLINICAL DATA:  Abdominal pain, leukocytosis, rectal and vaginal bleeding, history of colon cancer EXAM: CT ABDOMEN AND PELVIS WITH CONTRAST TECHNIQUE: Multidetector CT imaging of the abdomen and pelvis was performed using the standard protocol following bolus administration of intravenous contrast. CONTRAST:  83m OMNIPAQUE IOHEXOL 300 MG/ML SOLN, 836mOMNIPAQUE IOHEXOL 300 MG/ML SOLN COMPARISON:  02/13/2015 FINDINGS: Sagittal images of the spine shows degenerative changes lumbar spine. Mild for valve calcifications are again noted. Extensive atherosclerotic calcifications of abdominal aorta, splenic artery and bilateral iliac arteries. No aortic aneurysm. There is progression in size of low-density lesion in left hepatic lobe measures 3 cm. This is highly suspicious for metastatic disease. A second lesion in left hepatic lobe laterally measures 1.6 cm. No calcified gallstones are noted within gallbladder. The pancreas, spleen and adrenal glands are unremarkable. There is mild left hydronephrosis and left hydroureter. Mild delay excretion of the left ureter suspicious for mild obstructive uropathy. No small bowel obstruction.  No free abdominal  air. There is no pericecal inflammation. The terminal ileum is unremarkable. Again noted irregular mass in the sigmoid colon measures at least 7.5 cm in length. In the lateral aspect of the mass there is a collection with air-fluid level measures about 2.8 cm. This is highly suspicious for necrobiosis or abscess. The mass is in close proximity with the vagina. I cannot exclude a fistulous communication. Correlation with barium enema and GYN exam is recommended. The urinary bladder is unremarkable. Multiple sigmoid colon diverticula are noted. Again noted a cyst in upper pole of the left kidney medial aspect measures 2.4 cm. There is a third lesion in left hepatic lobe axial image 21 measures 1 cm. IMPRESSION: 1. Again noted irregular mass in the sigmoid colon measures at least 7.5 cm in length. In the lateral aspect of the mass there is a collection with air-fluid level measures about 2.8 cm. This is highly suspicious for necrobiosis or abscess. The  mass is in close proximity with the vagina. I cannot exclude a fistulous communication. Correlation with barium enema and GYN exam is recommended. 2. There is mild left hydronephrosis and left hydroureter. Mild delayed excretion on the left ureter. 3. Progression in size of metastatic lesion in left hepatic lobe measures 3 cm. There is probable a second lesion left hepatic lobe measures 1.6 cm. Additional lesion in left hepatic lobe measures 1 cm. 4. No small bowel obstruction. These results were called by telephone at the time of interpretation on 04/30/2015 at 10:44 pm to Dr. Blanchie Dessert , who verbally acknowledged these results. Electronically Signed   By: Lahoma Crocker M.D.   On: 04/30/2015 22:44     CBC  Recent Labs Lab 04/30/15 1520 05/01/15 0516  WBC 24.7* 15.6*  HGB 10.0* 9.0*  HCT 30.8* 28.0*  PLT 537* 461*  MCV 96.3 96.2  MCH 31.3 30.9  MCHC 32.5 32.1  RDW 14.4 14.4  LYMPHSABS 2.4  --   MONOABS 1.3*  --   EOSABS 0.1  --   BASOSABS 0.1  --      Chemistries   Recent Labs Lab 04/30/15 1520 05/01/15 0516  NA 134* 134*  K 4.6 4.1  CL 101 103  CO2 26 23  GLUCOSE 95 94  BUN 19 17  CREATININE 1.32* 1.01*  CALCIUM 9.1 8.3*  AST 20  --   ALT 11*  --   ALKPHOS 85  --   BILITOT 0.3  --    ------------------------------------------------------------------------------------------------------------------ estimated creatinine clearance is 29.6 mL/min (by C-G formula based on Cr of 1.01). ------------------------------------------------------------------------------------------------------------------ No results for input(s): HGBA1C in the last 72 hours. ------------------------------------------------------------------------------------------------------------------ No results for input(s): CHOL, HDL, LDLCALC, TRIG, CHOLHDL, LDLDIRECT in the last 72 hours. ------------------------------------------------------------------------------------------------------------------ No results for input(s): TSH, T4TOTAL, T3FREE, THYROIDAB in the last 72 hours.  Invalid input(s): FREET3 ------------------------------------------------------------------------------------------------------------------ No results for input(s): VITAMINB12, FOLATE, FERRITIN, TIBC, IRON, RETICCTPCT in the last 72 hours.  Coagulation profile No results for input(s): INR, PROTIME in the last 168 hours.  No results for input(s): DDIMER in the last 72 hours.  Cardiac Enzymes No results for input(s): CKMB, TROPONINI, MYOGLOBIN in the last 168 hours.  Invalid input(s): CK ------------------------------------------------------------------------------------------------------------------ Invalid input(s): POCBNP   CBG: No results for input(s): GLUCAP in the last 168 hours.     EKG: Independently reviewed. Sinus rhythm   Assessment/Plan Principal Problem:   Suspected Intra-abdominal abscess ([place]): Acute. Initial finding on CT scan with air-fluid level  measures about 2.8 cm of the abdomen. Differential includes necrobiosis, abscess, or fistula .  Patient with elevated white blood cell count to 24.7. Surgery consulted in ED. Will like trial of antibiotics and then consult IR for recommendations regarding possible drainage. -Consult IR for recommendations -Antibiotics of [place]  Adenocarcinoma of colon with metastatic spread: Worsening. Patient noted to have spread to multiple lesions per CT. Family would like to discuss hospice as an option with the patient some time tomorrow. Overall poor prognosis. Discuss with son regarding quantity quality of life and goals of care. -Palliative  care consulted to talk to the family regarding hospice   Abdominal itching and rash: Acute. Likely a manifestation of metastatic spread of the adenocarcinoma. -prn Benadryl IV, hydroxyzine  Leukocytosis: Acutely worsened. WBC 26 4.7 -Continue to monitor for response to antibiotic   Acute kidney injury- creatinine bump to 1.32 and a BUN 19. 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 Patient is hemodynamically stable acute blood losses likely secondary to above along with patient being on anticoagulation therapy -Continue to monitor cbc in a.m.  On anticoagulation therapy previous history of stroke -continue Lovenox to be dosed by pharmacy  -We'll need to discuss risk/ benefits of continuation of this therapy  Code Status:   full Family Communication: bedside Disposition Plan: admit   Total time spent 55 minutes.Greater than 50% of this time was spent in counseling, explanation of diagnosis, planning of further management, and coordination of care  [facility] Pager [phone]  If 7PM-7AM, please contact night-coverage [email or web address] Password [facility] 05/01/2015, 7:38 AM

## 2015-05-01 NOTE — Progress Notes (Signed)
ANTICOAGULATION CONSULT NOTE - Initial Consult  Pharmacy Consult for enoxaparin Indication: VTE treatment  Allergies  Allergen Reactions  . Lyrica [Pregabalin] Shortness Of Breath    Patient Measurements: Height: '5\' 1"'$  (154.9 cm) Weight: 105 lb 9.6 oz (47.9 kg) IBW/kg (Calculated) : 47.8 Heparin Dosing Weight:   Vital Signs: Temp: 97.3 F (36.3 C) (11/26 0535) Temp Source: Oral (11/26 0535) BP: 114/53 mmHg (11/26 0535) Pulse Rate: 83 (11/26 0535)  Labs:  Recent Labs  04/30/15 1520 05/01/15 0516  HGB 10.0* 9.0*  HCT 30.8* 28.0*  PLT 537* 461*  CREATININE 1.32* 1.01*    Estimated Creatinine Clearance: 29.6 mL/min (by C-G formula based on Cr of 1.01).   Medical History: Past Medical History  Diagnosis Date  . Spinal stenosis   . Basal cell carcinoma of nose 2005  . Squamous cell carcinoma of left hip 2005  . Colon cancer ([place]) dx'd 02/16/15  . Glaucoma   . Macular degeneration   . Stroke [facility])     Medications:  Scheduled:  . sodium chloride   Intravenous STAT  . acetaminophen  500 mg Oral BID WC  . aspirin EC  81 mg Oral Daily  . cycloSPORINE  1 drop Both Eyes BID  . docusate sodium  200 mg Oral QHS  . [START ON 05/02/2015] enoxaparin (LOVENOX) injection  50 mg Subcutaneous Q24H  . feeding supplement (ENSURE ENLIVE)  237 mL Oral BID BM  . fluticasone  1 spray Each Nare Daily  . montelukast  10 mg Oral QHS  . piperacillin-tazobactam (ZOSYN)  IV  3.375 g Intravenous 3 times per day  . polyethylene glycol  17 g Oral Daily  . saccharomyces boulardii  250 mg Oral BID  . senna  1 tablet Oral Daily  . triamcinolone cream  1 application Topical BID    Assessment: Patient with 1.'5mg'$ /kg sq q24hr enoxaparin dosing in ED with current renal function < 49m/min.  Spoke with MD to change to '1mg'$ /kg sq q24hr for reduced renal function.  MD agreed to let pharmacy manage enoxaparin dosing.  Goal of Therapy:  Anti-Xa level 0.6-1 units/ml 4hrs after LMWH dose  given Monitor platelets by anticoagulation protocol: Yes   Plan:  Change enoxaparin to '50mg'$  sq q24hr Monitor CrCl and adjust dose as needed.  GTyler Deis JShea StakesCrowford 05/01/2015,6:41 AM

## 2015-05-01 NOTE — Progress Notes (Signed)
Palliative Medicine Team consult was received.   Meeting scheduled for tomorrow morning at 9:00am with Katherine Cooke and her son.   If there are urgent needs or questions please call [phone]. Thank you for consulting out team to assist with this patients care.  Katherine Rough, MD [place] Team [phone]

## 2015-05-01 NOTE — Progress Notes (Signed)
General Surgery Note  LOS: 1 day  POD -     Assessment/Plan: 1.  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  She has met with Dr. Hassell Done, who as of just a week or two ago decided on non surgical treatment plan.  Son, [name], in the room.  They plan to meet with Gene Domingo Cocking, palliative care, tomorrow AM.  On Zosyn -  11/25 >>>  2.  Metastatic disease 3.  Obstructed left ureter 4.  History of stroke 5.  Anemia   Hgb - 9.0 - 05/01/2015 6.  Malnourished  Albumin - 2.8 - 04/30/2015  Note her prealbumin on 02/16/2015 ws 5.9! 7  DVT prophylaxis - Lovenox   Principal Problem:   Intra-abdominal abscess (HCC) Active Problems:   Leukocytosis   Adenocarcinoma of colon ([place])   Colovesical fistula   AKI (acute kidney injury) ([place])   Acute blood loss anemia   Subjective:  Somewhat confused by what is going on.  But comfortable and would like to be out of the hospital.  Her son, [name], is in the room.  Patient has seen Dr. Georgiann Cocker [facility] oncology) in [place] in addition to Dr. Earlie Server at the cancer center. Dr. Hassell Done is the surgeon here that was seeing her and she saw Dr. Lindon Romp of colorectal surgery at [facility]. I have not seen notes from the [facility] system.  She lives independently at [place].     Objective:   Filed Vitals:   05/01/15 0032 05/01/15 0535  BP: 95/51 114/53  Pulse: 86 83  Temp: 97.5 F (36.4 C) 97.3 F (36.3 C)  Resp: 22 22     Intake/Output from previous day:  11/25 0701 - 11/26 0700 In: 407.9 [I.V.:407.9] Out: 400 [Urine:400]  Intake/Output this shift:      Physical Exam:   General: Thin older WF who is alert and oriented.    HEENT: Normal. Pupils equal. .   Lungs: Clear.   Abdomen: Sore   Lab Results:    Recent Labs  04/30/15 1520 05/01/15 0516  WBC 24.7* 15.6*  HGB 10.0* 9.0*  HCT 30.8* 28.0*  PLT 537* 461*    BMET   Recent Labs   04/30/15 1520 05/01/15 0516  NA 134* 134*  K 4.6 4.1  CL 101 103  CO2 26 23  GLUCOSE 95 94  BUN 19 17  CREATININE 1.32* 1.01*  CALCIUM 9.1 8.3*    PT/INR  No results for input(s): LABPROT, INR in the last 72 hours.  ABG  No results for input(s): PHART, HCO3 in the last 72 hours.  Invalid input(s): PCO2, PO2   Studies/Results:  Dg Chest 2 View  04/30/2015  CLINICAL DATA:  Shortness of breath for 1 week.  Fatigue. EXAM: CHEST  2 VIEW COMPARISON:  02/22/2015 FINDINGS: The patient is rotated to the right on today's radiograph, reducing diagnostic sensitivity and specificity. Mild enlargement of the cardiopericardial silhouette without edema. No pleural effusion. Calcified mitral valve. Atherosclerotic abdominal aorta.  Thoracic spondylosis. IMPRESSION: 1. Stable mild enlargement of the cardiopericardial silhouette, without edema. Calcified mitral valve. 2. Thoracic spondylosis. 3. No acute findings. Electronically Signed   By: Van Clines M.D.   On: 04/30/2015 18:21   Ct Abdomen Pelvis W Contrast  04/30/2015  CLINICAL DATA:  Abdominal pain, leukocytosis, rectal and vaginal bleeding, history of colon cancer EXAM: CT ABDOMEN AND PELVIS WITH CONTRAST TECHNIQUE: Multidetector CT imaging of the abdomen and pelvis  was performed using the standard protocol following bolus administration of intravenous contrast. CONTRAST:  31mL OMNIPAQUE IOHEXOL 300 MG/ML SOLN, 1mL OMNIPAQUE IOHEXOL 300 MG/ML SOLN COMPARISON:  02/13/2015 FINDINGS: Sagittal images of the spine shows degenerative changes lumbar spine. Mild for valve calcifications are again noted. Extensive atherosclerotic calcifications of abdominal aorta, splenic artery and bilateral iliac arteries. No aortic aneurysm. There is progression in size of low-density lesion in left hepatic lobe measures 3 cm. This is highly suspicious for metastatic disease. A second lesion in left hepatic lobe laterally measures 1.6 cm. No calcified gallstones are  noted within gallbladder. The pancreas, spleen and adrenal glands are unremarkable. There is mild left hydronephrosis and left hydroureter. Mild delay excretion of the left ureter suspicious for mild obstructive uropathy. No small bowel obstruction.  No free abdominal air. There is no pericecal inflammation. The terminal ileum is unremarkable. Again noted irregular mass in the sigmoid colon measures at least 7.5 cm in length. In the lateral aspect of the mass there is a collection with air-fluid level measures about 2.8 cm. This is highly suspicious for necrobiosis or abscess. The mass is in close proximity with the vagina. I cannot exclude a fistulous communication. Correlation with barium enema and GYN exam is recommended. The urinary bladder is unremarkable. Multiple sigmoid colon diverticula are noted. Again noted a cyst in upper pole of the left kidney medial aspect measures 2.4 cm. There is a third lesion in left hepatic lobe axial image 21 measures 1 cm. IMPRESSION: 1. Again noted irregular mass in the sigmoid colon measures at least 7.5 cm in length. In the lateral aspect of the mass there is a collection with air-fluid level measures about 2.8 cm. This is highly suspicious for necrobiosis or abscess. The mass is in close proximity with the vagina. I cannot exclude a fistulous communication. Correlation with barium enema and GYN exam is recommended. 2. There is mild left hydronephrosis and left hydroureter. Mild delayed excretion on the left ureter. 3. Progression in size of metastatic lesion in left hepatic lobe measures 3 cm. There is probable a second lesion left hepatic lobe measures 1.6 cm. Additional lesion in left hepatic lobe measures 1 cm. 4. No small bowel obstruction. These results were called by telephone at the time of interpretation on 04/30/2015 at 10:44 pm to Dr. Blanchie Dessert , who verbally acknowledged these results. Electronically Signed   By: Lahoma Crocker M.D.   On: 04/30/2015 22:44      Anti-infectives:   Anti-infectives    Start     Dose/Rate Route Frequency Ordered Stop   05/01/15 0600  piperacillin-tazobactam (ZOSYN) IVPB 3.375 g     3.375 g 12.5 mL/hr over 240 Minutes Intravenous 3 times per day 05/01/15 0044     04/30/15 2315  piperacillin-tazobactam (ZOSYN) IVPB 3.375 g     3.375 g 12.5 mL/hr over 240 Minutes Intravenous  Once 04/30/15 2309 05/01/15 0415      Alphonsa Overall, MD, FACS Pager: [facility] Office: [phone] 05/01/2015

## 2015-05-01 NOTE — Progress Notes (Signed)
Patient ID: Katherine Cooke, female   DOB: Jun 20, 1925, 81 y.o.   MRN: [id number] [facility] PROGRESS NOTE  Katherine Cooke HEN:[id number] DOB: 14-Sep-1925 DOA: 04/30/2015 PCP: Idamae Schuller, MD  Brief narrative:    Pt admitted after midnight. Please refer to admission note done 05/01/2015.  81 year old female with a past medical history significant for sigmoid colon cancer found 02/2015, colovaginal fistula, stroke who presented to [facility] ED with generalized weakness for 2 week and new problem of bloody vaginal discharge. She also had abdominal itching and topical anti inching creams did not provide much symptomatic relief. On admission, CT scan showed a new air-fluid level that measures about 2.8 cm adjacent to the previous sigmoid mass which was suspected to be an abscess. Surgery was consulted and recommended antibiotics and possible IR intervention however pt declines any surgical procedures at this point.  Assessment/Plan:    Principal Problem:   Intra-abdominal abscess (HCC) / Leukocytosis - Continue zosyn - We will repeat CT scan on Monday to see if abscess better with abx - If no improvement will get IR to do drain if pt agreeable to this  DVT Prophylaxis  - Lovenox subQ ordered   Code Status: Full.  Family Communication:  plan of care discussed with the patient and her son at the bedside  Disposition Plan: Home likely by 05/04/2015..   IV access:  Peripheral IV  Procedures and diagnostic studies:    Dg Chest 2 View 04/30/2015  1. Stable mild enlargement of the cardiopericardial silhouette, without edema. Calcified mitral valve. 2. Thoracic spondylosis. 3. No acute findings. Electronically Signed   By: Van Clines M.D.   On: 04/30/2015 18:21   Ct Abdomen Pelvis W Contrast 04/30/2015  1. Again noted irregular mass in the sigmoid colon measures at least 7.5 cm in length. In the lateral aspect of the mass there is a collection with air-fluid level measures about 2.8  cm. This is highly suspicious for necrobiosis or abscess. The mass is in close proximity with the vagina. I cannot exclude a fistulous communication. Correlation with barium enema and GYN exam is recommended. 2. There is mild left hydronephrosis and left hydroureter. Mild delayed excretion on the left ureter. 3. Progression in size of metastatic lesion in left hepatic lobe measures 3 cm. There is probable a second lesion left hepatic lobe measures 1.6 cm. Additional lesion in left hepatic lobe measures 1 cm. 4. No small bowel obstruction. These results were called by telephone at the time of interpretation on 04/30/2015 at 10:44 pm to Dr. Blanchie Dessert , who verbally acknowledged these results. Electronically Signed   By: Lahoma Crocker M.D.   On: 04/30/2015 22:44    Medical Consultants:  Surgery Palliative care  Other Consultants:  Nutrition PT  IAnti-Infectives:   Zosyn 05/01/2015 -->    Leisa Lenz, MD  [facility] Pager [phone]  Time spent in minutes: 25 minutes  If 7PM-7AM, please contact night-coverage [email or web address] Password [facility] 05/01/2015, 12:39 PM   LOS: 1 day    HPI/Subjective: No acute overnight events. Patient reports she feels better.   Objective: Filed Vitals:   04/30/15 2100 04/30/15 2130 05/01/15 0032 05/01/15 0535  BP: 100/51 101/51 95/51 114/53  Pulse: 96 92 86 83  Temp:   97.5 F (36.4 C) 97.3 F (36.3 C)  TempSrc:   Oral Oral  Resp: '25 23 22 22  '$ Height:   '5\' 1"'$  (1.549 m)   Weight:   47.9 kg (105 lb 9.6 oz)  SpO2: 95% 92% 94% 97%    Intake/Output Summary (Last 24 hours) at 05/01/15 1239 Last data filed at 05/01/15 0600  Gross per 24 hour  Intake 407.92 ml  Output    400 ml  Net   7.92 ml    Exam:   General:  Pt is alert, follows commands appropriately, not in acute distress  Cardiovascular: Regular rate and rhythm, S1/S2 (+)  Respiratory: Clear to auscultation bilaterally, no wheezing, no crackles, no rhonchi  Abdomen: Soft,  non tender, non distended, bowel sounds present  Extremities: No edema, pulses DP and PT palpable bilaterally  Neuro: Grossly nonfocal  Data Reviewed: Basic Metabolic Panel:  Recent Labs Lab 04/30/15 1520 05/01/15 0516  NA 134* 134*  K 4.6 4.1  CL 101 103  CO2 26 23  GLUCOSE 95 94  BUN 19 17  CREATININE 1.32* 1.01*  CALCIUM 9.1 8.3*   Liver Function Tests:  Recent Labs Lab 04/30/15 1520  AST 20  ALT 11*  ALKPHOS 85  BILITOT 0.3  PROT 6.8  ALBUMIN 2.8*   No results for input(s): LIPASE, AMYLASE in the last 168 hours. No results for input(s): AMMONIA in the last 168 hours. CBC:  Recent Labs Lab 04/30/15 1520 05/01/15 0516  WBC 24.7* 15.6*  NEUTROABS 20.8*  --   HGB 10.0* 9.0*  HCT 30.8* 28.0*  MCV 96.3 96.2  PLT 537* 461*   Cardiac Enzymes: No results for input(s): CKTOTAL, CKMB, CKMBINDEX, TROPONINI in the last 168 hours. BNP: Invalid input(s): POCBNP CBG: No results for input(s): GLUCAP in the last 168 hours.  No results found for this or any previous visit (from the past 240 hour(s)).   Scheduled Meds: . acetaminophen  500 mg Oral BID WC  . aspirin EC  81 mg Oral Daily  . cycloSPORINE  1 drop Both Eyes BID  . docusate sodium  200 mg Oral QHS  . enoxaparin (LOVENOX) injection  50 mg Subcutaneous Q24H  . feeding supplement (ENSURE ENLIVE)  237 mL Oral BID BM  . fluticasone  1 spray Each Nare Daily  . montelukast  10 mg Oral QHS  . piperacillin-tazobactam (ZOSYN)  IV  3.375 g Intravenous 3 times per day  . polyethylene glycol  17 g Oral Daily  . saccharomyces boulardii  250 mg Oral BID  . senna  1 tablet Oral Daily  . triamcinolone cream  1 application Topical BID   Continuous Infusions: . sodium chloride 75 mL/hr at 05/01/15 0102

## 2015-05-01 NOTE — Progress Notes (Signed)
ANTIBIOTIC CONSULT NOTE - INITIAL  Pharmacy Consult for Zosyn Indication: Intra-abdominal infection  Allergies  Allergen Reactions  . Lyrica [Pregabalin] Shortness Of Breath    Patient Measurements: Height: '5\' 1"'$  (154.9 cm) Weight: 105 lb 9.6 oz (47.9 kg) IBW/kg (Calculated) : 47.8 Adjusted Body Weight:   Vital Signs: Temp: 97.3 F (36.3 C) (11/26 0535) Temp Source: Oral (11/26 0535) BP: 114/53 mmHg (11/26 0535) Pulse Rate: 83 (11/26 0535) Intake/Output from previous day: 11/25 0701 - 11/26 0700 In: 407.9 [I.V.:407.9] Out: -  Intake/Output from this shift: Total I/O In: 407.9 [I.V.:407.9] Out: -   Labs:  Recent Labs  04/30/15 1520 05/01/15 0516  WBC 24.7* 15.6*  HGB 10.0* 9.0*  PLT 537* 461*  CREATININE 1.32* 1.01*   Estimated Creatinine Clearance: 29.6 mL/min (by C-G formula based on Cr of 1.01). No results for input(s): VANCOTROUGH, VANCOPEAK, VANCORANDOM, GENTTROUGH, GENTPEAK, GENTRANDOM, TOBRATROUGH, TOBRAPEAK, TOBRARND, AMIKACINPEAK, AMIKACINTROU, AMIKACIN in the last 72 hours.   Microbiology: No results found for this or any previous visit (from the past 720 hour(s)).  Medical History: Past Medical History  Diagnosis Date  . Spinal stenosis   . Basal cell carcinoma of nose 2005  . Squamous cell carcinoma of left hip 2005  . Colon cancer ([place]) dx'd 02/16/15  . Glaucoma   . Macular degeneration   . Stroke [facility])     Medications:  Anti-infectives    Start     Dose/Rate Route Frequency Ordered Stop   05/01/15 0600  piperacillin-tazobactam (ZOSYN) IVPB 3.375 g     3.375 g 12.5 mL/hr over 240 Minutes Intravenous 3 times per day 05/01/15 0044     04/30/15 2315  piperacillin-tazobactam (ZOSYN) IVPB 3.375 g     3.375 g 12.5 mL/hr over 240 Minutes Intravenous  Once 04/30/15 2309 05/01/15 0415     Assessment: Pt with partially obstructing sigmoid colon cancer and colovaginal fistula. The patient was set up to have a resection and had a CVA the night  before. Surgery was canceled.  Patient back in ED with weakness/lethargy.  Still no planned surgery at this time per surgery note.  Goal of Therapy:  Zosyn based on renal function Appropriate antibiotic dosing for renal function; eradication of infection   Plan:  Follow up culture results  Zosyn 3.375g IV Q8H infused over 4hrs.   Tyler Deis, Shea Stakes [place] 05/01/2015,6:43 AM

## 2015-05-02 DIAGNOSIS — I639 Cerebral infarction, unspecified: Secondary | ICD-10-CM

## 2015-05-02 DIAGNOSIS — E43 Unspecified severe protein-calorie malnutrition: Secondary | ICD-10-CM

## 2015-05-02 DIAGNOSIS — Z515 Encounter for palliative care: Secondary | ICD-10-CM

## 2015-05-02 DIAGNOSIS — D638 Anemia in other chronic diseases classified elsewhere: Secondary | ICD-10-CM

## 2015-05-02 DIAGNOSIS — Z7189 Other specified counseling: Secondary | ICD-10-CM

## 2015-05-02 LAB — BASIC METABOLIC PANEL
Anion gap: 7 (ref 5–15)
BUN: 10 mg/dL (ref 6–20)
CO2: 23 mmol/L (ref 22–32)
CREATININE: 0.82 mg/dL (ref 0.44–1.00)
Calcium: 8.5 mg/dL — ABNORMAL LOW (ref 8.9–10.3)
Chloride: 107 mmol/L (ref 101–111)
Glucose, Bld: 88 mg/dL (ref 65–99)
POTASSIUM: 3.9 mmol/L (ref 3.5–5.1)
SODIUM: 137 mmol/L (ref 135–145)

## 2015-05-02 LAB — CBC
HCT: 29.2 % — ABNORMAL LOW (ref 36.0–46.0)
Hemoglobin: 9.4 g/dL — ABNORMAL LOW (ref 12.0–15.0)
MCH: 30.8 pg (ref 26.0–34.0)
MCHC: 32.2 g/dL (ref 30.0–36.0)
MCV: 95.7 fL (ref 78.0–100.0)
PLATELETS: 481 10*3/uL — AB (ref 150–400)
RBC: 3.05 MIL/uL — AB (ref 3.87–5.11)
RDW: 14.2 % (ref 11.5–15.5)
WBC: 13.1 10*3/uL — ABNORMAL HIGH (ref 4.0–10.5)

## 2015-05-02 MED ORDER — ENOXAPARIN SODIUM 80 MG/0.8ML ~~LOC~~ SOLN
1.5000 mg/kg | SUBCUTANEOUS | Status: DC
  Administered 2015-05-02 – 2015-05-05 (×4): 70 mg via SUBCUTANEOUS
  Filled 2015-05-02 (×5): qty 0.8

## 2015-05-02 NOTE — Progress Notes (Signed)
PHARMACY NOTE -  [facility] has been assisting with dosing of Zosyn for suspected intra-abdominal infection. Dosage remains stable at Zosyn 3.375g IV Q8H infused over 4hrs and need for further dosage adjustment appears unlikely at present.    Will sign off at this time.  Please reconsult if a change in clinical status warrants re-evaluation of dosage.  Romeo Rabon, PharmD, pager [phone]. 05/02/2015,11:33 AM.

## 2015-05-02 NOTE — Progress Notes (Signed)
General Surgery Note  LOS: 2 days  POD -     Assessment/Plan: 1.  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  She has met with Dr. Hassell Done, who as of just a week or two ago decided on non surgical treatment plan.  Son, [name], in the room.  To meet with Dr. Micheline Rough, palliative care, this AM.  On Zosyn -  11/25 >>>  2.  Metastatic disease 3.  Obstructed left ureter 4.  History of stroke 5.  Anemia   Hgb - 9.4 - 05/02/2015 6.  Malnourished  Albumin - 2.8 - 04/30/2015  Note her prealbumin on 02/16/2015 ws 5.9! 7  DVT prophylaxis - Lovenox   Principal Problem:   Intra-abdominal abscess (HCC) Active Problems:   Leukocytosis   Adenocarcinoma of colon ([place])   Colovesical fistula   AKI (acute kidney injury) ([place])   Acute blood loss anemia   Subjective:  Nothing new  Her son, [name], is in the room.  Note, there are 2 other children, a son in [place], and a daughter who lives here in [place].  It seems like only [name] is here for the Palliative care discussion.  Patient has seen Dr. Georgiann Cocker [facility] oncology) in [place] in addition to Dr. Earlie Server at the cancer center. Dr. Hassell Done is the surgeon here that was seeing her and she saw Dr. Lindon Romp of colorectal surgery at [facility]. I have not seen notes from the [facility] system.  She lives independently at [place].     Objective:   Filed Vitals:   05/01/15 2133 05/02/15 0516  BP: 147/73 181/84  Pulse: 89 85  Temp: 97.6 F (36.4 C) 98 F (36.7 C)  Resp: 18 18     Intake/Output from previous day:  11/26 0701 - 11/27 0700 In: 1672 [P.O.:600; I.V.:1072] Out: -   Intake/Output this shift:      Physical Exam:   General: Thin older WF who is alert and oriented.    HEENT: Normal. Pupils equal. .   Lungs: Clear.   Abdomen: Sore   Lab Results:     Recent Labs  05/01/15 0516 05/02/15 0510  WBC 15.6* 13.1*  HGB 9.0* 9.4*   HCT 28.0* 29.2*  PLT 461* 481*    BMET    Recent Labs  05/01/15 0516 05/02/15 0510  NA 134* 137  K 4.1 3.9  CL 103 107  CO2 23 23  GLUCOSE 94 88  BUN 17 10  CREATININE 1.01* 0.82  CALCIUM 8.3* 8.5*    PT/INR  No results for input(s): LABPROT, INR in the last 72 hours.  ABG  No results for input(s): PHART, HCO3 in the last 72 hours.  Invalid input(s): PCO2, PO2   Studies/Results:  Dg Chest 2 View  04/30/2015  CLINICAL DATA:  Shortness of breath for 1 week.  Fatigue. EXAM: CHEST  2 VIEW COMPARISON:  02/22/2015 FINDINGS: The patient is rotated to the right on today's radiograph, reducing diagnostic sensitivity and specificity. Mild enlargement of the cardiopericardial silhouette without edema. No pleural effusion. Calcified mitral valve. Atherosclerotic abdominal aorta.  Thoracic spondylosis. IMPRESSION: 1. Stable mild enlargement of the cardiopericardial silhouette, without edema. Calcified mitral valve. 2. Thoracic spondylosis. 3. No acute findings. Electronically Signed   By: Van Clines M.D.   On: 04/30/2015 18:21   Ct Abdomen Pelvis W Contrast  04/30/2015  CLINICAL DATA:  Abdominal pain, leukocytosis, rectal and vaginal bleeding, history of colon  cancer EXAM: CT ABDOMEN AND PELVIS WITH CONTRAST TECHNIQUE: Multidetector CT imaging of the abdomen and pelvis was performed using the standard protocol following bolus administration of intravenous contrast. CONTRAST:  64mL OMNIPAQUE IOHEXOL 300 MG/ML SOLN, 45mL OMNIPAQUE IOHEXOL 300 MG/ML SOLN COMPARISON:  02/13/2015 FINDINGS: Sagittal images of the spine shows degenerative changes lumbar spine. Mild for valve calcifications are again noted. Extensive atherosclerotic calcifications of abdominal aorta, splenic artery and bilateral iliac arteries. No aortic aneurysm. There is progression in size of low-density lesion in left hepatic lobe measures 3 cm. This is highly suspicious for metastatic disease. A second lesion in left  hepatic lobe laterally measures 1.6 cm. No calcified gallstones are noted within gallbladder. The pancreas, spleen and adrenal glands are unremarkable. There is mild left hydronephrosis and left hydroureter. Mild delay excretion of the left ureter suspicious for mild obstructive uropathy. No small bowel obstruction.  No free abdominal air. There is no pericecal inflammation. The terminal ileum is unremarkable. Again noted irregular mass in the sigmoid colon measures at least 7.5 cm in length. In the lateral aspect of the mass there is a collection with air-fluid level measures about 2.8 cm. This is highly suspicious for necrobiosis or abscess. The mass is in close proximity with the vagina. I cannot exclude a fistulous communication. Correlation with barium enema and GYN exam is recommended. The urinary bladder is unremarkable. Multiple sigmoid colon diverticula are noted. Again noted a cyst in upper pole of the left kidney medial aspect measures 2.4 cm. There is a third lesion in left hepatic lobe axial image 21 measures 1 cm. IMPRESSION: 1. Again noted irregular mass in the sigmoid colon measures at least 7.5 cm in length. In the lateral aspect of the mass there is a collection with air-fluid level measures about 2.8 cm. This is highly suspicious for necrobiosis or abscess. The mass is in close proximity with the vagina. I cannot exclude a fistulous communication. Correlation with barium enema and GYN exam is recommended. 2. There is mild left hydronephrosis and left hydroureter. Mild delayed excretion on the left ureter. 3. Progression in size of metastatic lesion in left hepatic lobe measures 3 cm. There is probable a second lesion left hepatic lobe measures 1.6 cm. Additional lesion in left hepatic lobe measures 1 cm. 4. No small bowel obstruction. These results were called by telephone at the time of interpretation on 04/30/2015 at 10:44 pm to Dr. Blanchie Dessert , who verbally acknowledged these results.  Electronically Signed   By: Lahoma Crocker M.D.   On: 04/30/2015 22:44     Anti-infectives:   Anti-infectives    Start     Dose/Rate Route Frequency Ordered Stop   05/01/15 0600  piperacillin-tazobactam (ZOSYN) IVPB 3.375 g     3.375 g 12.5 mL/hr over 240 Minutes Intravenous 3 times per day 05/01/15 0044     04/30/15 2315  piperacillin-tazobactam (ZOSYN) IVPB 3.375 g     3.375 g 12.5 mL/hr over 240 Minutes Intravenous  Once 04/30/15 2309 05/01/15 0415      Alphonsa Overall, MD, FACS Pager: [facility] Office: [phone] 05/02/2015

## 2015-05-02 NOTE — Progress Notes (Signed)
PT Cancellation Note  Patient Details Name: Katherine Cooke MRN: [id number] DOB: 1925-10-21   Cancelled Treatment:    Reason Eval/Treat Not Completed: Other (comment)note Palliative Care mtg today. Will check back after  [place] established for  PT intervention if indicated.   [name] 05/02/2015, 8:11 AM Tresa Endo PT [phone]

## 2015-05-02 NOTE — Progress Notes (Signed)
ANTICOAGULATION CONSULT NOTE - Follow up  Pharmacy Consult for enoxaparin Indication: VTE treatment  Allergies  Allergen Reactions  . Lyrica [Pregabalin] Shortness Of Breath    Patient Measurements: Height: '5\' 1"'$  (154.9 cm) Weight: 105 lb 9.6 oz (47.9 kg) IBW/kg (Calculated) : 47.8   Vital Signs: Temp: 98 F (36.7 C) (11/27 0516) Temp Source: Oral (11/27 0516) BP: 181/84 mmHg (11/27 0516) Pulse Rate: 85 (11/27 0516)  Labs:  Recent Labs  04/30/15 1520 05/01/15 0516 05/02/15 0510  HGB 10.0* 9.0* 9.4*  HCT 30.8* 28.0* 29.2*  PLT 537* 461* 481*  CREATININE 1.32* 1.01* 0.82    Estimated Creatinine Clearance: 36.5 mL/min (by C-G formula based on Cr of 0.82).   Medical History: Past Medical History  Diagnosis Date  . Spinal stenosis   . Basal cell carcinoma of nose 2005  . Squamous cell carcinoma of left hip 2005  . Colon cancer ([place]) dx'd 02/16/15  . Glaucoma   . Macular degeneration   . Stroke [facility])    Assessment: 81yo F on Lovenox 1.'5mg'$ /kg sq K38V PTA for hx embilic CVA. Resumed on admission at '1mg'$ /kg q24h d/t elevated SCr and CrCl <30.  - SCr has now improved and CrCl ~31m/min. - CBC is stable. - Also on ASA '81mg'$  as PTA.  - I spoke with Dr. DCharlies Silvers Bleeding has resolved and she is ok with increasing back to full-dose.  Goal of Therapy:  Anti-Xa level 0.6-1 units/ml 4hrs after LMWH dose given Monitor platelets by anticoagulation protocol: Yes   Plan:  Increase Lovenox to '70mg'$  SQ q24h (1.'5mg'$ /kg).  Pharmacy will continue to follow.  TRomeo Rabon PharmD, pager [phone] 05/02/2015,12:09 PM.

## 2015-05-02 NOTE — Consult Note (Signed)
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 On admission, CT scan showed a new air-fluid level that measures about 2.8 cm adjacent to the previous sigmoid mass which was suspected to be an abscess. Surgery was consulted and recommended antibiotics and possible IR intervention however pt declines any surgical procedures at this point. Palliative consulted for goals of care.  I met with Katherine Cooke her son, [name], today.   she reports the most important things to her being out of the hospital, spending time at home, and her family.   She states that her doctors been doing the job explaining things to her and she understands she has metastatic disease cannot be cured.  She also understands she has an acute infection which is currently beating treated with antibiotics She reports that her goals overall are to live as long as possible as well as possible while avoiding interventions that are not likely to add time her quality to her life. She is not interested in further disease modifying therapy for her cancer including chemotherapy or surgery.  We discussed that the hospital can be useful as long as she is getting well enough from care she receives at the hospital to enjoy time at home, butwe have reached a point where, if her goal is to be at home, she may be better served to plan on being at home and bringing care to her at home rather repeated trips to the  hospital. We discussed hospice as a tool that may be beneficial in this goal as we have reached a point where we are trying to fix problems that are not fixable.  We also discussed that as she is not interested in disease modifying therapy, does not mean that there is not care to give, but it means that her care needs to be focused on ensuring that she has  good management of her symptoms as possible as this will allow her to maintain her functional status and improve her overall quality of life. we discussed that this would be a very good fit for hospice if she chooses to enroll.   Contacts/Participants in Discussion: patient and her son, [name]  Tax adviser: Patient  HCPOA:  none on chart   SUMMARY OF RECOMMENDATIONS - Patient wants to continue with current antibiotics and plan for repeat CT to evaluate abscess early next week  - We discussed possibility of discharge with hospice support and she reports that she wants to consider this. She seems to be leaning toward this possibility and her son is supportive of this decision.  - We'll plan to meet up with her again tomorrow to continue conversation   Code Status/Advance Care Planning: DNR    Code Status Orders        Start     Ordered   05/02/15 0844  Do not attempt resuscitation (DNR)   Continuous    Question Answer Comment  In the event of cardiac or respiratory ARREST Do not call a "code blue"   In the event of cardiac or respiratory ARREST Do not  perform Intubation, CPR, defibrillation or ACLS   In the event of cardiac or respiratory ARREST Use medication by any route, position, wound care, and other measures to relive pain and suffering. May use oxygen, suction and manual treatment of airway obstruction as needed for comfort.      05/02/15 0844     Symptom Management:   Reports symptoms currently well controlled. Continue current regimen.  Palliative Prophylaxis:   Bowel Regimen and Palliative Wound  Care  Psycho-social/Spiritual:  Support System: Strong Desire for further Chaplaincy support: No Additional Recommendations: Education on Hospice  Prognosis: less than 6 months   Discharge Planning: To be determined. Most likely home with hospice support.   Chief Complaint/ Primary Diagnoses: Present on Admission:  . Intra-abdominal abscess ([place]) . Adenocarcinoma of colon ([place]) . Colovesical fistula . Leukocytosis . AKI (acute kidney injury) ([place])  I have reviewed the medical record, interviewed the patient and family, and examined the patient. The following aspects are pertinent.  Past Medical History  Diagnosis Date  . Spinal stenosis   . Basal cell carcinoma of nose 2005  . Squamous cell carcinoma of left hip 2005  . Colon cancer ([place]) dx'd 02/16/15  . Glaucoma   . Macular degeneration   . Stroke [facility])    Social History   Social History  . Marital Status: Widowed    Spouse Name: N/A  . Number of Children: N/A  . Years of Education: N/A   Social History Main Topics  . Smoking status: Never Smoker   . Smokeless tobacco: Never Used  . Alcohol Use: 4.2 oz/week    7 Glasses of wine per week     Comment: occasionally  . Drug Use: No  . Sexual Activity: Not Currently   Other Topics Concern  . None   Social History Narrative   Family History  Problem Relation Age of Onset  . Breast cancer Maternal Aunt   . Ovarian cancer Sister   . Heart disease Father   . Ulcers Father   . COPD Mother    Scheduled Meds: . acetaminophen  500 mg Oral BID WC  . aspirin EC  81 mg Oral Daily  . cycloSPORINE  1 drop Both Eyes BID  . docusate sodium  200 mg Oral QHS  . enoxaparin (LOVENOX) injection  1.5 mg/kg Subcutaneous Q24H  . feeding supplement (ENSURE ENLIVE)  237 mL Oral BID BM  . fluticasone  1 spray Each Nare Daily  . montelukast  10 mg Oral QHS  . piperacillin-tazobactam (ZOSYN)  IV  3.375 g Intravenous 3 times per day  . polyethylene glycol  17 g Oral Daily  .  saccharomyces boulardii  250 mg Oral BID  . senna  1 tablet Oral Daily  . triamcinolone cream  1 application Topical BID   Continuous Infusions: . sodium chloride 75 mL/hr at 05/02/15 0833   PRN Meds:.diphenhydrAMINE, HYDROcodone-acetaminophen, hydrOXYzine, morphine injection, ondansetron **OR** ondansetron (ZOFRAN) IV, polyvinyl alcohol Medications Prior to Admission:  Prior to Admission medications   Medication Sig Start Date End Date Taking? Authorizing Provider  acetaminophen (TYLENOL) 500 MG tablet Take 1 tablet (500 mg total) by mouth 2 (two) times daily with breakfast and lunch. 03/01/15  Yes Ivan Anchors Love, PA-C  ASPIRIN LOW DOSE 81 MG EC tablet Take 81 mg by mouth daily. 04/23/15  Yes Historical Provider, MD  cycloSPORINE (RESTASIS) 0.05 % ophthalmic emulsion Place 1 drop into both eyes 2 (two) times daily.   Yes Historical Provider, MD  diphenhydrAMINE (BENADRYL) 12.5 MG/5ML elixir Take 5 mLs (12.5 mg total) by mouth every 8 (eight) hours as needed for itching. 02/22/15  Yes Rhetta Mura, MD  docusate sodium (COLACE) 100 MG capsule Take 2 capsules (200 mg total) by mouth at bedtime. 03/01/15  Yes Evlyn Kanner Love, PA-C  enoxaparin (LOVENOX) 80 MG/0.8ML injection Inject 0.8 mLs (80 mg total) into the skin daily. 04/23/15  Yes Marvel Plan, MD  feeding supplement, ENSURE ENLIVE, (ENSURE ENLIVE) LIQD Take 237 mLs by mouth 2 (two) times daily between meals. 02/22/15  Yes Rhetta Mura, MD  fluticasone (FLONASE) 50 MCG/ACT nasal spray Place 1 spray into both nostrils daily. 03/01/15  Yes Evlyn Kanner Love, PA-C  HYDROcodone-acetaminophen (NORCO/VICODIN) 5-325 MG tablet Take 1 tablet by mouth every 6 (six) hours as needed for moderate pain.   Yes Historical Provider, MD  hydrOXYzine (ATARAX/VISTARIL) 10 MG tablet Take 10 mg by mouth 3 (three) times daily as needed for itching.  04/26/15  Yes Historical Provider, MD  montelukast (SINGULAIR) 10 MG tablet Take 10 mg by mouth at bedtime. 01/25/15   Yes Historical Provider, MD  Multiple Vitamins-Minerals (EYE VITAMINS PO) Take 1 tablet by mouth daily.    Yes Historical Provider, MD  ondansetron (ZOFRAN) 4 MG tablet Take 1 tablet (4 mg total) by mouth every 6 (six) hours as needed for nausea. 03/01/15  Yes Evlyn Kanner Love, PA-C  polyethylene glycol (MIRALAX / GLYCOLAX) packet Take 17 g by mouth daily. 03/01/15  Yes Evlyn Kanner Love, PA-C  polyvinyl alcohol (LIQUIFILM TEARS) 1.4 % ophthalmic solution Place 1 drop into both eyes as needed for dry eyes. 02/22/15  Yes Rhetta Mura, MD  saccharomyces boulardii (FLORASTOR) 250 MG capsule Take 1 capsule (250 mg total) by mouth 2 (two) times daily. 03/01/15  Yes Evlyn Kanner Love, PA-C  senna (SENOKOT) 8.6 MG TABS tablet Take 1 tablet by mouth daily.    Yes Historical Provider, MD  triamcinolone cream (KENALOG) 0.1 % Apply 1 application topically 2 (two) times daily. 04/26/15  Yes Historical Provider, MD  Turmeric 500 MG CAPS Take 1 tablet by mouth daily.    Yes Historical Provider, MD   Allergies  Allergen Reactions  . Lyrica [Pregabalin] Shortness Of Breath    Review of Systems  Constitutional: Positive for activity change, appetite change, fatigue and unexpected weight change.  Gastrointestinal: Positive for abdominal pain and abdominal distention.    Physical Exam  Constitutional: No distress.  Thin, elderly appearing female  HENT:  Head: Normocephalic and atraumatic.  Mouth/Throat: No oropharyngeal exudate.  Eyes: Conjunctivae and EOM are normal. No scleral icterus.  Neck: No JVD present.  Cardiovascular: Normal rate.   No murmur heard. Respiratory: Effort normal. No stridor. No respiratory distress. She has no wheezes.  GI: Soft. There is no tenderness.  Musculoskeletal: She exhibits no edema.  Neurological: She is alert. No cranial nerve deficit.  Skin: Skin is warm and dry.  Psychiatric: She has a normal mood and affect. Judgment and thought content normal.    Vital Signs: BP  153/76 mmHg  Pulse 77  Temp(Src) 97.6 F (36.4 C) (Oral)  Resp 20  Ht 5\' 1"  (1.549 m)  Wt 47.9 kg (105 lb 9.6 oz)  BMI 19.96 kg/m2  SpO2 96%  SpO2: SpO2: 96 % O2 Device:SpO2: 96 % O2 Flow Rate: .   IO: Intake/output summary:  Intake/Output Summary (Last 24 hours) at 05/02/15 2026 Last data filed at 05/02/15 1700  Gross per 24 hour  Intake  285 ml  Output      0 ml  Net    285 ml    LBM: Last BM Date: 05/01/15 Baseline Weight: Weight: 47.9 kg (105 lb 9.6 oz) Most recent weight: Weight: 47.9 kg (105 lb 9.6 oz)      Palliative Assessment/Data:  Flowsheet Rows        Most Recent Value   Intake Tab    Referral Department  Hospitalist   Unit at Time of Referral  Med/Surg Unit   Palliative Care Primary Diagnosis  Cancer   Date Notified  04/30/15   Palliative Care Type  New Palliative care   Date of Admission  04/30/15   Date first seen by Palliative Care  05/01/15   # of days Palliative referral response time  1 Day(s)   # of days IP prior to Palliative referral  0   Clinical Assessment    Palliative Performance Scale Score  60%   Pain Max last 24 hours  5   Pain Min Last 24 hours  0   Psychosocial & Spiritual Assessment    Palliative Care Outcomes    Patient/Family meeting held?  Yes   Palliative Care Outcomes  ACP counseling assistance, Counseled regarding hospice      Additional Data Reviewed:  CBC:    Component Value Date/Time   WBC 13.1* 05/02/2015 0510   HGB 9.4* 05/02/2015 0510   HCT 29.2* 05/02/2015 0510   PLT 481* 05/02/2015 0510   MCV 95.7 05/02/2015 0510   NEUTROABS 20.8* 04/30/2015 1520   LYMPHSABS 2.4 04/30/2015 1520   MONOABS 1.3* 04/30/2015 1520   EOSABS 0.1 04/30/2015 1520   BASOSABS 0.1 04/30/2015 1520   Comprehensive Metabolic Panel:    Component Value Date/Time   NA 137 05/02/2015 0510   K 3.9 05/02/2015 0510   CL 107 05/02/2015 0510   CO2 23 05/02/2015 0510   BUN 10 05/02/2015 0510   CREATININE 0.82 05/02/2015 0510   GLUCOSE  88 05/02/2015 0510   CALCIUM 8.5* 05/02/2015 0510   AST 20 04/30/2015 1520   ALT 11* 04/30/2015 1520   ALKPHOS 85 04/30/2015 1520   BILITOT 0.3 04/30/2015 1520   PROT 6.8 04/30/2015 1520   ALBUMIN 2.8* 04/30/2015 1520     Time In: 0905 Time Out: 1015 Time Total: 70 Greater than 50%  of this time was spent counseling and coordinating care related to the above assessment and plan.  Signed by: Micheline Rough, MD  Micheline Rough, MD  05/02/2015, 8:26 PM  Please contact Palliative Medicine Team phone at [phone] for questions and concerns.

## 2015-05-02 NOTE — Progress Notes (Addendum)
Patient ID: Katherine Cooke, female   DOB: 1925/09/23, 81 y.o.   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 She also had abdominal itching and topical anti inching creams did not provide much symptomatic relief.  On admission, CT scan showed a new air-fluid level that measures about 2.8 cm adjacent to the previous sigmoid mass which was suspected to be an abscess. Surgery was consulted and recommended antibiotics and possible IR intervention however pt declines any surgical procedures at this point.  Assessment/Plan:    Principal Problem: Intra-abdominal abscess (HCC) / Leukocytosis - Continue zosyn - We will repeat CT scan on Monday or Tuesday to see if abscess better with abx - If no improvement will get IR to do drain the abscess if pt still agreeable to this . As of now she says drainage is ok.  Active Problems: Acute renal failure - Likely due to acute infection - Improving with IV fluids   Anemia of chronic disease - Due to history of colon adenocarcinoma - Hemoglobin stable  Colon adenocarcinoma - Palliative consulted for goals of care; pt declines any surgery at this point - Appreciate PCT input   Multifocal acute infarct affecting the cerebellum, left paramedian pons, left thalamus and left posterior parietal and occipital regions  - Diagnosed in September 2016. - Continue daily aspirin   Severe protein calorie malnutrition -  Continue nutritional supplementation     DVT Prophylaxis  - Lovenox subQ   Code Status: DNR/DNI Family Communication:  plan of care discussed with the patient and her son at the bedside  Disposition  Plan: Home likely by 05/05/2015..   IV access:  Peripheral IV  Procedures and diagnostic studies:    Dg Chest 2 View 04/30/2015  1. Stable mild enlargement of the cardiopericardial silhouette, without edema. Calcified mitral valve. 2. Thoracic spondylosis. 3. No acute findings. Electronically Signed   By: Van Clines M.D.   On: 04/30/2015 18:21   Ct Abdomen Pelvis W Contrast 04/30/2015  1. Again noted irregular mass in the sigmoid colon measures at least 7.5 cm in length. In the lateral aspect of the mass there is a collection with air-fluid level measures about 2.8 cm. This is highly suspicious for necrobiosis or abscess. The mass is in close proximity with the vagina. I cannot exclude a fistulous communication. Correlation with barium enema and GYN exam is recommended. 2. There is mild left hydronephrosis and left hydroureter. Mild delayed excretion on the left ureter. 3. Progression in size of metastatic lesion in left hepatic lobe measures 3 cm. There is probable a second lesion left hepatic lobe measures 1.6 cm. Additional lesion in left hepatic lobe measures 1 cm. 4. No small bowel obstruction. These results were called by telephone at the time of interpretation on 04/30/2015 at 10:44 pm to Dr. Blanchie Dessert , who verbally acknowledged these results. Electronically Signed   By: Lahoma Crocker M.D.   On: 04/30/2015 22:44    Medical Consultants:  Surgery Palliative care  Other Consultants:  Nutrition PT  IAnti-Infectives:   Zosyn 05/01/2015 -->    Leisa Lenz, MD  [facility] Pager [phone]  Time spent in minutes: 25 minutes  If 7PM-7AM, please contact  night-coverage [email or web address] Password [email or web address] 05/02/2015, 11:34 AM   LOS: 2 days    HPI/Subjective: No acute overnight events. Patient reports pain is 2/10.  Objective: Filed Vitals:   05/01/15 0535 05/01/15 1500 05/01/15 2133 05/02/15 0516  BP: 114/53 139/59 147/73 181/84  Pulse: 83 82 89 85  Temp: 97.3 F  (36.3 C) 97.5 F (36.4 C) 97.6 F (36.4 C) 98 F (36.7 C)  TempSrc: Oral Oral Oral Oral  Resp: '22 18 18 18  '$ Height:      Weight:      SpO2: 97% 98% 99% 95%    Intake/Output Summary (Last 24 hours) at 05/02/15 1134 Last data filed at 05/01/15 1752  Gross per 24 hour  Intake   1432 ml  Output      0 ml  Net   1432 ml    Exam:   General:  Pt is alert, not in acute distress  Cardiovascular: RRR, appreciate S1, S2  Respiratory: No wheezing, no crackles, no rhonchi  Abdomen: non tender, bowel sounds present  Extremities: No leg swelling, pulses palpable  Neuro: Nonfocal  Data Reviewed: Basic Metabolic Panel:  Recent Labs Lab 04/30/15 1520 05/01/15 0516 05/02/15 0510  NA 134* 134* 137  K 4.6 4.1 3.9  CL 101 103 107  CO2 '26 23 23  '$ GLUCOSE 95 94 88  BUN '19 17 10  '$ CREATININE 1.32* 1.01* 0.82  CALCIUM 9.1 8.3* 8.5*   Liver Function Tests:  Recent Labs Lab 04/30/15 1520  AST 20  ALT 11*  ALKPHOS 85  BILITOT 0.3  PROT 6.8  ALBUMIN 2.8*   No results for input(s): LIPASE, AMYLASE in the last 168 hours. No results for input(s): AMMONIA in the last 168 hours. CBC:  Recent Labs Lab 04/30/15 1520 05/01/15 0516 05/02/15 0510  WBC 24.7* 15.6* 13.1*  NEUTROABS 20.8*  --   --   HGB 10.0* 9.0* 9.4*  HCT 30.8* 28.0* 29.2*  MCV 96.3 96.2 95.7  PLT 537* 461* 481*   Cardiac Enzymes: No results for input(s): CKTOTAL, CKMB, CKMBINDEX, TROPONINI in the last 168 hours. BNP: Invalid input(s): POCBNP CBG: No results for input(s): GLUCAP in the last 168 hours.  No results found for this or any previous visit (from the past 240 hour(s)).   Scheduled Meds: . acetaminophen  500 mg Oral BID WC  . aspirin EC  81 mg Oral Daily  . cycloSPORINE  1 drop Both Eyes BID  . docusate sodium  200 mg Oral QHS  . enoxaparin (LOVENOX) injection  50 mg Subcutaneous Q24H  . feeding supplement (ENSURE ENLIVE)  237 mL Oral BID BM  . fluticasone  1 spray Each Nare Daily  .  montelukast  10 mg Oral QHS  . piperacillin-tazobactam (ZOSYN)  IV  3.375 g Intravenous 3 times per day  . polyethylene glycol  17 g Oral Daily  . saccharomyces boulardii  250 mg Oral BID  . senna  1 tablet Oral Daily  . triamcinolone cream  1 application Topical BID   Continuous Infusions: . sodium chloride 75 mL/hr at 05/02/15 [phone]

## 2015-05-03 ENCOUNTER — Telehealth: Payer: Self-pay | Admitting: Neurology

## 2015-05-03 LAB — BASIC METABOLIC PANEL
Anion gap: 6 (ref 5–15)
BUN: 8 mg/dL (ref 6–20)
CHLORIDE: 105 mmol/L (ref 101–111)
CO2: 25 mmol/L (ref 22–32)
CREATININE: 0.72 mg/dL (ref 0.44–1.00)
Calcium: 8.4 mg/dL — ABNORMAL LOW (ref 8.9–10.3)
GFR calc non Af Amer: >60 mL/min (ref >60)
Glucose, Bld: 92 mg/dL (ref 65–99)
Potassium: 3.8 mmol/L (ref 3.5–5.1)
Sodium: 136 mmol/L (ref 135–145)

## 2015-05-03 LAB — CBC
HEMATOCRIT: 28.5 % — AB (ref 36.0–46.0)
HEMOGLOBIN: 9.2 g/dL — AB (ref 12.0–15.0)
MCH: 30.7 pg (ref 26.0–34.0)
MCHC: 32.3 g/dL (ref 30.0–36.0)
MCV: 95 fL (ref 78.0–100.0)
Platelets: 489 10*3/uL — ABNORMAL HIGH (ref 150–400)
RBC: 3 MIL/uL — ABNORMAL LOW (ref 3.87–5.11)
RDW: 14.1 % (ref 11.5–15.5)
WBC: 10.9 10*3/uL — ABNORMAL HIGH (ref 4.0–10.5)

## 2015-05-03 LAB — GLUCOSE, CAPILLARY: Glucose-Capillary: 82 mg/dL (ref 65–99)

## 2015-05-03 NOTE — Progress Notes (Signed)
I stopped by to follow up with Ms. Katherine Cooke.  She reports that she is very tired as she had lots of visitors throughout the day.    She reports that she just wants to rest and sleep. She is agreeable to me seeing her again tomorrow.  Micheline Rough, MD [place] Team [phone]

## 2015-05-03 NOTE — Care Management Important Message (Signed)
Important Message  Patient Details  Name: Katherine Cooke MRN: [id number] Date of Birth: May 14, 1926   Medicare Important Message Given:  Yes    Camillo Flaming 05/03/2015, 12:29 [place] Message  Patient Details  Name: Katherine Cooke MRN: [id number] Date of Birth: 1926/02/05   Medicare Important Message Given:  Yes    Camillo Flaming 05/03/2015, 12:29 PM

## 2015-05-03 NOTE — Telephone Encounter (Signed)
Discussed with son over the phone. Pt was admitted on 04/30/15 for abdominal abscess and dehydration. Her CrCl decreased at that time so lovenox does was decreased to '1mg'$ /kg daily. However, the second day, her CrCl improved, so the lovenox dose was up to 1.'5mg'$ /kg daily, the therapeutic dose. Son just had some concern about the dosing changes on Saturday and wanted to reach [facility] on call physician by telephone but our message system did not allow him to leave a message. He was very upset about that. He stated that he has left message to Marcie Bal to look into it.   He also complained that while in hospital, he wanted in patient on call neurologist opinion about the dosing change and he asked one floor staff to page the neurologist on call but did not get call back. I explained to him that the neurology consultation has to be called out by primary team i.e. hospitalist in her case. If hospitalist thinks it is necessary to have neuro consult, neurology will be called. Otherwise, no need for consultation. In her case, pt has no neuro changes, and levonex dosage has been taken care of and there is no need to call neuro consult. Pt expressed understanding and appreciation.   Rosalin Hawking, MD PhD Stroke Neurology 05/03/2015 9:45 AM

## 2015-05-03 NOTE — Progress Notes (Signed)
Patient ID: Katherine Cooke, female   DOB: 1925-06-10, 81 y.o.   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 She also had abdominal itching and topical anti inching creams did not provide much symptomatic relief.  On admission, CT scan showed a new air-fluid level that measures about 2.8 cm adjacent to the previous sigmoid mass which was suspected to be an abscess. Surgery was consulted and recommended antibiotics and possible IR intervention however pt declines any surgical procedures at this point.  Assessment/Plan:    Principal Problem: Intra-abdominal abscess (HCC) / Leukocytosis - Continue zosyn - WBC count almost WNL - No need to repeat CT scan at this point - Appreciate surgery input  - Diet as tolerated    Active Problems: Acute renal failure - Likely due to acute infection - Cr WNL with IV fluids   Anemia of chronic disease - Due to history of colon adenocarcinoma - Hemoglobins table  Colon adenocarcinoma - Palliative consulted for goals of care; pt declines any surgery at this point  Multifocal acute infarct affecting the cerebellum, left paramedian pons, left thalamus and left posterior parietal and occipital regions  - Diagnosed in September 2016. - Continue daily aspirin   Severe protein calorie malnutrition -  Continue nutritional supplementation as recommended by dietician   DVT Prophylaxis  - Lovenox subQ in hospital   Code Status: DNR/DNI Family Communication:  plan of care discussed with the patient and her son at the bedside  Disposition Plan: Home likely by 05/05/2015..   IV access:  Peripheral  IV  Procedures and diagnostic studies:    Dg Chest 2 View 04/30/2015  1. Stable mild enlargement of the cardiopericardial silhouette, without edema. Calcified mitral valve. 2. Thoracic spondylosis. 3. No acute findings. Electronically Signed   By: Van Clines M.D.   On: 04/30/2015 18:21   Ct Abdomen Pelvis W Contrast 04/30/2015  1. Again noted irregular mass in the sigmoid colon measures at least 7.5 cm in length. In the lateral aspect of the mass there is a collection with air-fluid level measures about 2.8 cm. This is highly suspicious for necrobiosis or abscess. The mass is in close proximity with the vagina. I cannot exclude a fistulous communication. Correlation with barium enema and GYN exam is recommended. 2. There is mild left hydronephrosis and left hydroureter. Mild delayed excretion on the left ureter. 3. Progression in size of metastatic lesion in left hepatic lobe measures 3 cm. There is probable a second lesion left hepatic lobe measures 1.6 cm. Additional lesion in left hepatic lobe measures 1 cm. 4. No small bowel obstruction. These results were called by telephone at the time of interpretation on 04/30/2015 at 10:44 pm to Dr. Blanchie Dessert , who verbally acknowledged these results. Electronically Signed   By: Lahoma Crocker M.D.   On: 04/30/2015 22:44    Medical Consultants:  Surgery Palliative care  Other Consultants:  Nutrition PT  IAnti-Infectives:   Zosyn 05/01/2015 -->    Leisa Lenz, MD  [facility] Pager [phone]  Time spent in minutes: 15 minutes  If 7PM-7AM, please contact night-coverage [email or web address] Password [email or web address] 05/03/2015, 11:30 AM   LOS: 3 days    HPI/Subjective: No acute  overnight events. Patient reports feeling better.   Objective: Filed Vitals:   05/02/15 0516 05/02/15 1500 05/02/15 2124 05/03/15 0555  BP: 181/84 153/76 157/72 138/63  Pulse: 85 77 107 82  Temp: 98 F (36.7 C) 97.6 F (36.4 C) 98.9 F (37.2 C) 97.9 F (36.6  C)  TempSrc: Oral Oral Oral Oral  Resp: '18 20 20 20  '$ Height:      Weight:      SpO2: 95% 96% 94% 94%    Intake/Output Summary (Last 24 hours) at 05/03/15 1130 Last data filed at 05/03/15 0700  Gross per 24 hour  Intake    305 ml  Output      0 ml  Net    305 ml    Exam:   General:  Pt is not in acute distress  Cardiovascular: RRR, (+) S1, S2  Respiratory: bilateral air entry, no wheezing   Abdomen: non tender, (+) BS, non distended   Extremities: No edema, palpable pulses   Neuro: No focal deficits   Data Reviewed: Basic Metabolic Panel:  Recent Labs Lab 04/30/15 1520 05/01/15 0516 05/02/15 0510 05/03/15 0510  NA 134* 134* 137 136  K 4.6 4.1 3.9 3.8  CL 101 103 107 105  CO2 '26 23 23 25  '$ GLUCOSE 95 94 88 92  BUN '19 17 10 8  '$ CREATININE 1.32* 1.01* 0.82 0.72  CALCIUM 9.1 8.3* 8.5* 8.4*   Liver Function Tests:  Recent Labs Lab 04/30/15 1520  AST 20  ALT 11*  ALKPHOS 85  BILITOT 0.3  PROT 6.8  ALBUMIN 2.8*   No results for input(s): LIPASE, AMYLASE in the last 168 hours. No results for input(s): AMMONIA in the last 168 hours. CBC:  Recent Labs Lab 04/30/15 1520 05/01/15 0516 05/02/15 0510 05/03/15 0510  WBC 24.7* 15.6* 13.1* 10.9*  NEUTROABS 20.8*  --   --   --   HGB 10.0* 9.0* 9.4* 9.2*  HCT 30.8* 28.0* 29.2* 28.5*  MCV 96.3 96.2 95.7 95.0  PLT 537* 461* 481* 489*   Cardiac Enzymes: No results for input(s): CKTOTAL, CKMB, CKMBINDEX, TROPONINI in the last 168 hours. BNP: Invalid input(s): POCBNP CBG:  Recent Labs Lab 05/03/15 0807  GLUCAP 82    No results found for this or any previous visit (from the past 240 hour(s)).   Scheduled Meds: . acetaminophen  500 mg Oral BID WC  . aspirin EC  81 mg Oral Daily  . cycloSPORINE  1 drop Both Eyes BID  . docusate sodium  200 mg Oral QHS  . enoxaparin (LOVENOX) injection  1.5 mg/kg Subcutaneous Q24H  . feeding supplement (ENSURE ENLIVE)  237 mL Oral BID BM  . fluticasone  1 spray  Each Nare Daily  . montelukast  10 mg Oral QHS  . piperacillin-tazobactam (ZOSYN)  IV  3.375 g Intravenous 3 times per day  . polyethylene glycol  17 g Oral Daily  . saccharomyces boulardii  250 mg Oral BID  . senna  1 tablet Oral Daily  . triamcinolone cream  1 application Topical BID   Continuous Infusions: . sodium chloride 75 mL/hr at 05/02/15 [phone]

## 2015-05-03 NOTE — Telephone Encounter (Signed)
[name] son called sts he tried to call over the weekend but the [place] did not leave option to leave a msg so he was unable to contact anyone. He is disturbed about this as his mother was admitted to hospital 04/30/15 and he had questions about Levenox doseage. He was told it was determined by hospital pharmacist. Her hospitalist is Dr Charlies Silvers. Please call him [phone]

## 2015-05-03 NOTE — Evaluation (Signed)
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 Pt currently with functional limitations due to the deficits listed below (see PT Problem List).  Pt will benefit from skilled PT to increase their independence and safety with mobility to allow discharge to the venue listed below.  Pt agreeable to work with PT in acute care to assist with mobilizing and discharging safely back to her apt at [place].     Follow Up Recommendations No PT follow up    Equipment Recommendations  None recommended by PT    Recommendations for Other Services       Precautions / Restrictions Precautions Precautions: Fall      Mobility  Bed Mobility Overal bed mobility: Modified Independent                Transfers Overall transfer level: Needs assistance Equipment used: None Transfers: Sit to/from Stand Sit to Stand: Min guard         General transfer comment: min/guard for safety  Ambulation/Gait Ambulation/Gait assistance: Min guard Ambulation Distance (Feet): 160 Feet Assistive device: None Gait Pattern/deviations: Step-through pattern;Decreased stride length     General Gait Details: slow but steady gait, occasionally using hand rail in hallway  Stairs            Wheelchair Mobility    Modified Rankin (Stroke Patients Only)       Balance                                             Pertinent Vitals/Pain Pain Assessment: 0-10 Pain Score: 2  Pain Location:  "sides" Pain Descriptors / Indicators: Sore Pain Intervention(s): Limited activity within patient's tolerance;Monitored during session    Home Living   Living Arrangements: Alone Available Help at Discharge: Family;Available PRN/intermittently Type of Home: Independent living facility Home Access: Level entry     Home Layout: One level Home Equipment: Clinical cytogeneticist - 2 wheels;Cane - single point Additional Comments: Lived ILF at [place]    Prior Function Level of Independence: Independent               Hand Dominance        Extremity/Trunk Assessment               Lower Extremity Assessment: Generalized weakness         Communication   Communication: No difficulties  Cognition Arousal/Alertness: Awake/alert Behavior During Therapy: WFL for tasks assessed/performed Overall Cognitive Status: Within Functional Limits for tasks assessed                      General Comments      Exercises        Assessment/Plan    PT Assessment Patient needs continued PT services  PT Diagnosis Difficulty walking   PT Problem List Decreased activity tolerance;Decreased mobility  PT Treatment Interventions DME instruction;Gait training;Functional mobility training;Patient/family education;Therapeutic activities;Therapeutic exercise   PT Goals (Current goals can  be found in the Care Plan section) Acute Rehab PT Goals Patient Stated Goal: maintain mobility and strength to get back home as soon as possible PT Goal Formulation: With patient Time For Goal Achievement: 05/10/15 Potential to Achieve Goals: Good    Frequency Min 2X/week   Barriers to discharge        Co-evaluation               End of Session   Activity Tolerance: Patient tolerated treatment well Patient left: in bed;with call bell/phone within reach;with family/visitor present;with bed alarm set Nurse Communication: Mobility status         Time: 1281-1886 PT Time  Calculation (min) (ACUTE ONLY): 18 min   Charges:   PT Evaluation $Initial PT Evaluation Tier I: 1 Procedure     PT G Codes:        Katherine Cooke,Katherine Cooke 05/03/2015, 3:35 PM Carmelia Bake, PT, DPT 05/03/2015 Pager: [phone]

## 2015-05-03 NOTE — Progress Notes (Signed)
Patient ID: Katherine Cooke, female   DOB: May 25, 1926, 81 y.o.   MRN: [id number]    Subjective: Pt doesn't feel great this morning.  Doesn't have much appetite.  Has some mild abdominal pain.  No nausea.  Still moving her bowels some  Objective: Vital signs in last 24 hours: Temp:  [97.6 F (36.4 C)-98.9 F (37.2 C)] 97.9 F (36.6 C) (11/28 0555) Pulse Rate:  [77-107] 82 (11/28 0555) Resp:  [20] 20 (11/28 0555) BP: (138-157)/(63-76) 138/63 mmHg (11/28 0555) SpO2:  [94 %-96 %] 94 % (11/28 0555) Last BM Date: 05/01/15  Intake/Output from previous day: 11/27 0701 - 11/28 0700 In: 405 [P.O.:405] Out: -  Intake/Output this shift:    PE: Abd: soft, minimally tender, +BS, minimal bloating Heart: regular Lungs: CTAB  Lab Results:   Recent Labs  05/02/15 0510 05/03/15 0510  WBC 13.1* 10.9*  HGB 9.4* 9.2*  HCT 29.2* 28.5*  PLT 481* 489*   BMET  Recent Labs  05/02/15 0510 05/03/15 0510  NA 137 136  K 3.9 3.8  CL 107 105  CO2 23 25  GLUCOSE 88 92  BUN 10 8  CREATININE 0.82 0.72  CALCIUM 8.5* 8.4*   PT/INR No results for input(s): LABPROT, INR in the last 72 hours. CMP     Component Value Date/Time   NA 136 05/03/2015 0510   K 3.8 05/03/2015 0510   CL 105 05/03/2015 0510   CO2 25 05/03/2015 0510   GLUCOSE 92 05/03/2015 0510   BUN 8 05/03/2015 0510   CREATININE 0.72 05/03/2015 0510   CALCIUM 8.4* 05/03/2015 0510   PROT 6.8 04/30/2015 1520   ALBUMIN 2.8* 04/30/2015 1520   AST 20 04/30/2015 1520   ALT 11* 04/30/2015 1520   ALKPHOS 85 04/30/2015 1520   BILITOT 0.3 04/30/2015 1520   GFRNONAA >60 05/03/2015 0510   GFRAA >60 05/03/2015 0510   Lipase     Component Value Date/Time   LIPASE 18* 02/13/2015 1217       Studies/Results: No results found.  Anti-infectives: Anti-infectives    Start     Dose/Rate Route Frequency Ordered Stop   05/01/15 0600  piperacillin-tazobactam (ZOSYN) IVPB 3.375 g     3.375 g 12.5 mL/hr over 240 Minutes Intravenous 3  times per day 05/01/15 0044     04/30/15 2315  piperacillin-tazobactam (ZOSYN) IVPB 3.375 g     3.375 g 12.5 mL/hr over 240 Minutes Intravenous  Once 04/30/15 2309 05/01/15 0415       Assessment/Plan  1. Partially obstructing colon cancer with mets to liver, lungs -patient not completely obstructed at this point. -does not want to pursue surgical intervention or chemo at this time.  Would ultimately like to pursue hospice is the basis of what she is telling me and is appears the same from her discussion with palliative care yesterday as well -WBC normalizing.  CT scan appears to be necrotic tumor vs possible abscess, but we would tend to favor necrotic tumor.  No need for repeat CT scan at this point as long as her WBC continues to improve and she overall improves.  She really doesn't have much left sided pain. -given recent CVA, patient is still high risk for surgery, which we discussed as well today as she was exploring all of her possibilities. -most recent PET scan results: IMPRESSION:  1. Large mass which involves the colon with surrounding hypermetabolic adenopathy. Cannot see a normal left ovary. Unsure whether this lesion is ovarian or colonic  2. Retroperitoneal hypermetabolic abdominal and pelvic adenopathy  3. Metastatic liver disease  4. Mild left hydronephrosis from the mass  5. Bony metastatic lesion proximal left femoral shaft  6. A couple of Small lung nodules -we will cont to follow this patient for now.  LOS: 3 days    Lamika Connolly E 05/03/2015, 8:47 AM Pager: [phone]

## 2015-05-04 ENCOUNTER — Ambulatory Visit (HOSPITAL_COMMUNITY): Payer: Medicare Other

## 2015-05-04 ENCOUNTER — Encounter (HOSPITAL_COMMUNITY): Payer: Self-pay | Admitting: Internal Medicine

## 2015-05-04 DIAGNOSIS — Z515 Encounter for palliative care: Secondary | ICD-10-CM | POA: Diagnosis present

## 2015-05-04 DIAGNOSIS — C787 Secondary malignant neoplasm of liver and intrahepatic bile duct: Secondary | ICD-10-CM | POA: Diagnosis present

## 2015-05-04 DIAGNOSIS — Z7189 Other specified counseling: Secondary | ICD-10-CM | POA: Diagnosis present

## 2015-05-04 DIAGNOSIS — E440 Moderate protein-calorie malnutrition: Secondary | ICD-10-CM | POA: Diagnosis present

## 2015-05-04 MED ORDER — LORAZEPAM 1 MG PO TABS
1.0000 mg | ORAL_TABLET | Freq: Once | ORAL | Status: AC
  Administered 2015-05-04: 1 mg via ORAL
  Filled 2015-05-04: qty 1

## 2015-05-04 MED ORDER — ENSURE ENLIVE PO LIQD
237.0000 mL | Freq: Three times a day (TID) | ORAL | Status: DC
  Administered 2015-05-04 – 2015-05-06 (×7): 237 mL via ORAL

## 2015-05-04 MED ORDER — IOHEXOL 300 MG/ML  SOLN
25.0000 mL | INTRAMUSCULAR | Status: AC
  Administered 2015-05-04 (×2): 25 mL via ORAL

## 2015-05-04 MED ORDER — IOHEXOL 300 MG/ML  SOLN
100.0000 mL | Freq: Once | INTRAMUSCULAR | Status: AC | PRN
  Administered 2015-05-04: 80 mL via INTRAVENOUS

## 2015-05-04 NOTE — Progress Notes (Addendum)
Patient ID: Katherine Cooke, female   DOB: 11-21-1925, 81 y.o.   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 She also had abdominal itching and topical anti inching creams did not provide much symptomatic relief.  On admission, CT scan showed a new air-fluid level that measures about 2.8 cm adjacent to the previous sigmoid mass which was suspected to be an abscess. Surgery was consulted and recommended antibiotics and possible IR intervention however pt declines any surgical procedures at this point.  Barrier to discharge: We will repeat CT scan today. Anticipated discharge by 05/06/2015.  Assessment/Plan:    Principal Problem: Intra-abdominal abscess ([place]) in the setting of colon adenocarcinoma with liver metastasis, colovesical fistula / Leukocytosis - CT scan on the admission demonstrated irregular mass in the sigmoid colon about 7.5 cm in the length with collection of air fluid level about 2.8 cm highly suspicious for necrobiosis or abscess. There is also a suggestion of fistulous communication as the mass is in the close proximity with the vagina. Also seen was progression in size of metastatic lesion in left hepatic lobe measures 3 cm and probable 2 other lesions in the left hepatic lobe. No small bowel obstruction. - Patient has declined surgical intervention. If needed to have drainage for the abscess she is agreeable to this. - We will continue Zosyn for now. - Per surgery, we will repeat CT scan today for further evaluation of abscess versus tumor necrosis. Appreciate surgery recommendations. - Palliative care has been consulted for goals of care,  appreciate their input as well.  Active Problems: Acute renal failure - Likely due to acute infection - Cr normalized with fluids.  Anemia of chronic disease - Due to history of colon adenocarcinoma - Hemoglobin is 9.2 - No reports of bleeding - Check CBC tomorrow morning.  Multifocal acute infarct affecting the cerebellum, left paramedian pons, left thalamus and left posterior parietal and occipital regions  - Diagnosed in September 2016. - Continue daily aspirin for secondary stroke prevention  Moderate protein calorie malnutrition - In the context of malignancy, chronic illness - Seen by nutritionist - Continue nutritional supplementation  DVT Prophylaxis  - Lovenox subQ ordered  Code Status: DNR/DNI Family Communication:  plan of care discussed with the patient and her son at the bedside daily Disposition Plan: Plan to repeat CT scan today. Anticipated discharge by 05/06/2015.  IV access:  Peripheral IV  Procedures and diagnostic studies:    Dg Chest 2 View 04/30/2015  1. Stable mild enlargement of the cardiopericardial silhouette, without edema. Calcified mitral valve. 2. Thoracic spondylosis. 3. No acute findings. Electronically Signed   By: Van Clines M.D.   On: 04/30/2015 18:21   Ct Abdomen Pelvis W Contrast 04/30/2015  1. Again noted irregular mass in the sigmoid colon measures at least 7.5 cm in length. In the lateral aspect of the mass there is a collection with air-fluid level measures about 2.8 cm. This is highly suspicious for necrobiosis or abscess. The mass is in close proximity with the vagina. I cannot exclude a fistulous communication. Correlation with barium enema and GYN exam is recommended. 2. There is mild left hydronephrosis and left hydroureter. Mild delayed excretion on the left  ureter. 3. Progression in size of metastatic lesion in left hepatic lobe measures 3 cm. There is probable a second lesion left hepatic lobe measures 1.6 cm. Additional  lesion in left hepatic lobe measures 1 cm. 4. No small bowel obstruction. These results were called by telephone at the time of interpretation on 04/30/2015 at 10:44 pm to Dr. Blanchie Dessert , who verbally acknowledged these results. Electronically Signed   By: Lahoma Crocker M.D.   On: 04/30/2015 22:44    Medical Consultants:  Surgery Palliative care  Other Consultants:  Nutrition PT  IAnti-Infectives:   Zosyn 05/01/2015 -->    Leisa Lenz, MD  [facility] Pager [phone]  Time spent in minutes: 25 minutes  If 7PM-7AM, please contact night-coverage [email or web address] Password [facility] 05/04/2015, 10:44 AM   LOS: 4 days    HPI/Subjective: No acute overnight events. Patient reports abdominal pain is 2 out of 10.  Objective: Filed Vitals:   05/03/15 0555 05/03/15 1500 05/03/15 2221 05/04/15 0624  BP: 138/63 125/54 154/6 147/59  Pulse: 82 90 100 93  Temp: 97.9 F (36.6 C) 98.5 F (36.9 C) 98.4 F (36.9 C) 99 F (37.2 C)  TempSrc: Oral Oral Oral Oral  Resp: '20 18 20 18  '$ Height:      Weight:      SpO2: 94% 95% 94% 96%    Intake/Output Summary (Last 24 hours) at 05/04/15 1044 Last data filed at 05/04/15 3300  Gross per 24 hour  Intake 2098.75 ml  Output      0 ml  Net 2098.75 ml    Exam:   General:  Pt is alert, awake, no acute distress  Cardiovascular: Rate controlled, appreciate S1-S2  Respiratory: Clear to auscultation bilaterally, no wheezing or rhonchi  Abdomen: Appreciate bowel sounds, nontender and nondistended abdomen  Extremities: No leg swelling, pulses palpable  Neuro: Nonfocal  Data Reviewed: Basic Metabolic Panel:  Recent Labs Lab 04/30/15 1520 05/01/15 0516 05/02/15 0510 05/03/15 0510  NA 134* 134* 137 136  K 4.6 4.1 3.9 3.8  CL 101 103 107 105  CO2 '26 23 23 25  '$ GLUCOSE 95 94 88 92  BUN '19 17 10 8  '$ CREATININE 1.32* 1.01* 0.82 0.72  CALCIUM 9.1 8.3* 8.5* 8.4*   Liver Function Tests:  Recent Labs Lab 04/30/15 1520  AST  20  ALT 11*  ALKPHOS 85  BILITOT 0.3  PROT 6.8  ALBUMIN 2.8*   No results for input(s): LIPASE, AMYLASE in the last 168 hours. No results for input(s): AMMONIA in the last 168 hours. CBC:  Recent Labs Lab 04/30/15 1520 05/01/15 0516 05/02/15 0510 05/03/15 0510  WBC 24.7* 15.6* 13.1* 10.9*  NEUTROABS 20.8*  --   --   --   HGB 10.0* 9.0* 9.4* 9.2*  HCT 30.8* 28.0* 29.2* 28.5*  MCV 96.3 96.2 95.7 95.0  PLT 537* 461* 481* 489*   Cardiac Enzymes: No results for input(s): CKTOTAL, CKMB, CKMBINDEX, TROPONINI in the last 168 hours. BNP: Invalid input(s): POCBNP CBG:  Recent Labs Lab 05/03/15 0807  GLUCAP 82    No results found for this or any previous visit (from the past 240 hour(s)).   Scheduled Meds: . acetaminophen  500 mg Oral BID WC  . aspirin EC  81 mg Oral Daily  . cycloSPORINE  1 drop Both Eyes BID  . docusate sodium  200 mg Oral QHS  . enoxaparin (LOVENOX) injection  1.5 mg/kg Subcutaneous Q24H  . feeding supplement (ENSURE ENLIVE)  237 mL Oral  TID BM  . fluticasone  1 spray Each Nare Daily  . iohexol  25 mL Oral Q1 Hr x 2  . montelukast  10 mg Oral QHS  . piperacillin-tazobactam (ZOSYN)  IV  3.375 g Intravenous 3 times per day  . polyethylene glycol  17 g Oral Daily  . saccharomyces boulardii  250 mg Oral BID  . senna  1 tablet Oral Daily  . triamcinolone cream  1 application Topical BID   Continuous Infusions: . sodium chloride 75 mL/hr at 05/02/15 [phone]

## 2015-05-04 NOTE — Progress Notes (Addendum)
Daily Progress Note   Patient Name: Katherine Cooke       Date: 05/04/2015 DOB: Nov 29, 1925  Age: 81 y.o. MRN#: [id number] Attending Physician: Robbie Lis, MD Primary Care Physician: Idamae Schuller, MD Admit Date: 04/30/2015  Reason for Consultation/Follow-up: Establishing goals of care  Subjective: I met with Katherine Cooke and her son, Katherine Cooke this morning. We talked about short-term goal of completing treatment for infection this hospitalization and getting back to her home. We also talked about long-term goal to live as long as possible as well as possible and remaining in her home and how hospice could benefit her in this regard.  She reports that she wants to enroll with hospice at the [facility] on discharge.  Length of Stay: 4 days  Current Medications: Scheduled Meds:  . acetaminophen  500 mg Oral BID WC  . aspirin EC  81 mg Oral Daily  . cycloSPORINE  1 drop Both Eyes BID  . docusate sodium  200 mg Oral QHS  . enoxaparin (LOVENOX) injection  1.5 mg/kg Subcutaneous Q24H  . feeding supplement (ENSURE ENLIVE)  237 mL Oral TID BM  . fluticasone  1 spray Each Nare Daily  . montelukast  10 mg Oral QHS  . piperacillin-tazobactam (ZOSYN)  IV  3.375 g Intravenous 3 times per day  . polyethylene glycol  17 g Oral Daily  . saccharomyces boulardii  250 mg Oral BID  . senna  1 tablet Oral Daily  . triamcinolone cream  1 application Topical BID    Continuous Infusions: . sodium chloride 75 mL/hr at 05/04/15 1835    PRN Meds: diphenhydrAMINE, HYDROcodone-acetaminophen, hydrOXYzine, morphine injection, ondansetron **OR** ondansetron (ZOFRAN) IV, polyvinyl alcohol  Physical Exam: Physical Exam  General: Alert, awake, in no acute distress.  HEENT: No bruits, no goiter, no  JVD Heart: Regular rate and rhythm. No murmur appreciated. Lungs: Good air movement, clear Abdomen: Soft, nontender, nondistended, positive bowel sounds.  Ext: No significant edema Skin: Warm and dry Neuro: Grossly intact, nonfocal.           Vital Signs: BP 137/59 mmHg  Pulse 98  Temp(Src) 98.1 F (36.7 C) (Oral)  Resp 18  Ht _0  (1.549 m)  Wt 47.9 kg (105 lb 9.6 oz)  BMI 19.96 kg/m2  SpO2 93% SpO2: SpO2: 93 % O2  Device: O2 Device: Not Delivered O2 Flow Rate:    Intake/output summary:  Intake/Output Summary (Last 24 hours) at 05/04/15 1842 Last data filed at 05/04/15 1835  Gross per 24 hour  Intake   2300 ml  Output      0 ml  Net   2300 ml   LBM: Last BM Date: 05/03/15 Baseline Weight: Weight: 47.9 kg (105 lb 9.6 oz) Most recent weight: Weight: 47.9 kg (105 lb 9.6 oz)       Palliative Assessment/Data: Flowsheet Rows        Most Recent Value   Intake Tab    Referral Department  Hospitalist   Unit at Time of Referral  Med/Surg Unit   Palliative Care Primary Diagnosis  Cancer   Date Notified  05/01/15   Palliative Care Type  New Palliative care   Reason for referral  Clarify Goals of Care, Counsel Regarding Hospice   Date of Admission  04/30/15   Date first seen by Palliative Care  05/01/15   # of days Palliative referral response time  0 Day(s)   # of days IP prior to Palliative referral  1   Clinical Assessment    Palliative Performance Scale Score  60%   Pain Max last 24 hours  5   Pain Min Last 24 hours  0   Psychosocial & Spiritual Assessment    Palliative Care Outcomes    Patient/Family meeting held?  Yes   Palliative Care Outcomes  ACP counseling assistance, Counseled regarding hospice      Additional Data Reviewed: CBC    Component Value Date/Time   WBC 10.9* 05/03/2015 0510   RBC 3.00* 05/03/2015 0510   HGB 9.2* 05/03/2015 0510   HCT 28.5* 05/03/2015 0510   PLT 489* 05/03/2015 0510   MCV 95.0 05/03/2015 0510   MCH 30.7 05/03/2015 0510    MCHC 32.3 05/03/2015 0510   RDW 14.1 05/03/2015 0510   LYMPHSABS 2.4 04/30/2015 1520   MONOABS 1.3* 04/30/2015 1520   EOSABS 0.1 04/30/2015 1520   BASOSABS 0.1 04/30/2015 1520    CMP     Component Value Date/Time   NA 136 05/03/2015 0510   K 3.8 05/03/2015 0510   CL 105 05/03/2015 0510   CO2 25 05/03/2015 0510   GLUCOSE 92 05/03/2015 0510   BUN 8 05/03/2015 0510   CREATININE 0.72 05/03/2015 0510   CALCIUM 8.4* 05/03/2015 0510   PROT 6.8 04/30/2015 1520   ALBUMIN 2.8* 04/30/2015 1520   AST 20 04/30/2015 1520   ALT 11* 04/30/2015 1520   ALKPHOS 85 04/30/2015 1520   BILITOT 0.3 04/30/2015 1520   GFRNONAA >60 05/03/2015 0510   GFRAA >60 05/03/2015 0510       Problem List:  Patient Active Problem List   Diagnosis Date Noted  . Liver metastases ([place]) 05/04/2015  . Malnutrition of moderate degree 05/04/2015  . Colovesical fistula 05/01/2015  . AKI (acute kidney injury) ([place]) 05/01/2015  . Intra-abdominal abscess ([place]) 04/30/2015  . Adenocarcinoma of colon ([place]) 03/01/2015  . Cerebrovascular accident (CVA) involving cerebellum ([place]) 02/17/2015  . Anemia of chronic disease 02/15/2015  . Leukocytosis      Palliative Care Assessment & Plan    1.Code Status:  DNR    Code Status Orders        Start     Ordered   05/02/15 0844  Do not attempt resuscitation (DNR)   Continuous    Question Answer Comment  In the event of cardiac or  respiratory ARREST Do not call a "code blue"   In the event of cardiac or respiratory ARREST Do not perform Intubation, CPR, defibrillation or ACLS   In the event of cardiac or respiratory ARREST Use medication by any route, position, wound care, and other measures to relive pain and suffering. May use oxygen, suction and manual treatment of airway obstruction as needed for comfort.      05/02/15 0844       2. Goals of Care/Additional Recommendations:  She was to continue treatment this hospitalization her current infection.    On discharge she would like to enroll with hospice. Her son reports that her family had good experience with hospice the [place]. Case management was notified of her desire to enroll with hospice of the [place] on discharge.  We will not follow daily from this point, but please contact us if we can be of further assistance in the care of Katherine Cooke.  3. Symptom Management:      1. continue same   4. Palliative Prophylaxis:   Bowel Regimen and Delirium Protocol  5. Prognosis: Less than 6 months   6. Discharge Planning:  Home with Hospice after completion of treatment for current infection   Care plan was discussed wpatient, her son, and case management Thank you for allowing the Palliative Medicine Team to assist in the care of this patient.   Time In: 0900 Time Out: 0930 Total Time 30 Prolonged Time Billed no        Micheline Rough, MD  05/04/2015, 6:42 PM  Please contact Palliative Medicine Team phone at [phone] for questions and concerns.

## 2015-05-04 NOTE — Progress Notes (Signed)
Patient ID: Katherine Cooke, female   DOB: 07-21-1925, 81 y.o.   MRN: [id number]    Subjective: Pt feels better today, but still not eating well and minimal appetite.  Objective: Vital signs in last 24 hours: Temp:  [98.4 F (36.9 C)-99 F (37.2 C)] 99 F (37.2 C) (11/29 0624) Pulse Rate:  [90-100] 93 (11/29 0624) Resp:  [18-20] 18 (11/29 0624) BP: (125-154)/(6-59) 147/59 mmHg (11/29 0624) SpO2:  [94 %-96 %] 96 % (11/29 0624) Last BM Date: 05/03/15  Intake/Output from previous day: 11/28 0701 - 11/29 0700 In: 2098.8 [P.O.:270; I.V.:1728.8; IV Piggyback:100] Out: -  Intake/Output this shift:    PE: Abd: soft, minimally tender, +BS, minimal bloating  Lab Results:   Recent Labs  05/02/15 0510 05/03/15 0510  WBC 13.1* 10.9*  HGB 9.4* 9.2*  HCT 29.2* 28.5*  PLT 481* 489*   BMET  Recent Labs  05/02/15 0510 05/03/15 0510  NA 137 136  K 3.9 3.8  CL 107 105  CO2 23 25  GLUCOSE 88 92  BUN 10 8  CREATININE 0.82 0.72  CALCIUM 8.5* 8.4*   PT/INR No results for input(s): LABPROT, INR in the last 72 hours. CMP     Component Value Date/Time   NA 136 05/03/2015 0510   K 3.8 05/03/2015 0510   CL 105 05/03/2015 0510   CO2 25 05/03/2015 0510   GLUCOSE 92 05/03/2015 0510   BUN 8 05/03/2015 0510   CREATININE 0.72 05/03/2015 0510   CALCIUM 8.4* 05/03/2015 0510   PROT 6.8 04/30/2015 1520   ALBUMIN 2.8* 04/30/2015 1520   AST 20 04/30/2015 1520   ALT 11* 04/30/2015 1520   ALKPHOS 85 04/30/2015 1520   BILITOT 0.3 04/30/2015 1520   GFRNONAA >60 05/03/2015 0510   GFRAA >60 05/03/2015 0510   Lipase     Component Value Date/Time   LIPASE 18* 02/13/2015 1217       Studies/Results: No results found.  Anti-infectives: Anti-infectives    Start     Dose/Rate Route Frequency Ordered Stop   05/01/15 0600  piperacillin-tazobactam (ZOSYN) IVPB 3.375 g     3.375 g 12.5 mL/hr over 240 Minutes Intravenous 3 times per day 05/01/15 0044     04/30/15 2315   piperacillin-tazobactam (ZOSYN) IVPB 3.375 g     3.375 g 12.5 mL/hr over 240 Minutes Intravenous  Once 04/30/15 2309 05/01/15 0415       Assessment/Plan   1. Partially obstructing colon cancer with mets to liver, lungs -patient not completely obstructed at this point. -does not want to pursue surgical intervention or chemo at this time. Would ultimately like to pursue hospice I think. -WBC normalizing. will repeat CT scan today to further evaluate this area that may be necrotic tumor vs abscess.  It's felt to more likely represent necrotic tumor than abscess, but the repeat CT scan will hopefully give [place] more info regarding this. -given recent CVA, patient is still high risk for surgery, which we discussed as well today as she was exploring all of her possibilities.   LOS: 4 days    Abner Ardis E 05/04/2015, 9:31 AM Pager: [phone]

## 2015-05-04 NOTE — Progress Notes (Signed)
Initial Nutrition Assessment  DOCUMENTATION CODES:   Non-severe (moderate) malnutrition in context of chronic illness  INTERVENTION:  - Continue Ensure Enlive TID, each supplement provides 350 kcal and 20 grams of protein - Encourage PO intakes of meals, use Ensure with medication administration - RD will continue to monitor for needs  NUTRITION DIAGNOSIS:   Inadequate oral intake related to poor appetite as evidenced by per patient/family report, meal completion < 50%.  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 They report that the bloody vaginal discharge are new and have been going on for 2 weeks now. She has tried nothing to relieve the symptoms denies any nausea or vomiting. New symptoms also include abdominal wall itching. Patient complains of abdominal wall itching across the entire daily. Not relieved with any topical creams so far. Family note significant fatigue and lethargy. Son is present at bedside and presents most of history as patient is very sleepy and tired after receiving hydroxyzine for her abdominal itching. He notes that they had discussed getting hospice involved; however were instructed that a physician had to write an order for this. Patient was evaluated by multiple surgeons and oncology since we first diagnosed and patient had elected not to pursue any surgical treatment..  Pt seen for MST and consult. BMI indicates normal weight status. Per chart review, pt ate 35% of breakfast and 45% of lunch 11/27 and 50% of breakfast yesterday (11/28). Pt and son together  provide information. For breakfast pt had a few bites each of scrambled eggs, muffin, sausage, and drank ~50% of orange juice.  Pt has had ongoing poor appetite for approximately 2 months. She feels extremely full after only a few bites at each meal. She does not feel nauseated or have overt pain, simply a feeling of fullness. At home PTA she was drinking Ensure without issue and has been drinking at least a few sips each day since admission. Son reports sometimes appetite will slightly increase and pt will eat slightly more than usual but this is sparatic.   Pt and son unsure of UBW but feel pt has lost weight recently. Per chart review, pt has lost 7 lbs (6% body weight) in the past 3 months which is not significant for time frame. Physical assessment indicates mild fat wasting and moderate and severe fat wasting.   Son asks about fluid needs per day and about ways to help improve pt's appetite; encouraged him to discuss with MD. Pt typically uses water with medications; encouraged her to use Ensure during these times.  Noted that Palliative to meet with pt today. Per review, pt with recent CVA; denies chewing or swallowing issues related to this.   Not meeting needs. Medications reviewed. Labs reviewed; Ca: 8.4 mg/dL.   Diet Order:  Diet regular Room service appropriate?: Yes; Fluid consistency:: Thin  Skin:  Reviewed, no issues  Last BM:  11/28  Height:   Ht Readings from Last 1 Encounters:  05/01/15 '5\' 1"'$  (1.549 m)    Weight:   Wt Readings from Last 1 Encounters:  05/01/15 105 lb 9.6 oz (47.9 kg)  Ideal Body Weight:  47.73 kg (kg)  BMI:  Body mass index is 19.96 kg/(m^2).  Estimated Nutritional Needs:   Kcal:  9597-4718  Protein:  55-65 grams  Fluid:  >/= 2 L/day  EDUCATION NEEDS:   No education needs identified at this time      Jarome Matin, RD, LDN Inpatient Clinical Dietitian Pager # [phone] After hours/weekend pager # [phone]

## 2015-05-05 ENCOUNTER — Ambulatory Visit: Payer: Medicare Other | Admitting: Neurology

## 2015-05-05 DIAGNOSIS — E440 Moderate protein-calorie malnutrition: Secondary | ICD-10-CM

## 2015-05-05 DIAGNOSIS — C787 Secondary malignant neoplasm of liver and intrahepatic bile duct: Secondary | ICD-10-CM

## 2015-05-05 DIAGNOSIS — N1330 Unspecified hydronephrosis: Secondary | ICD-10-CM | POA: Diagnosis present

## 2015-05-05 LAB — COMPREHENSIVE METABOLIC PANEL
ALBUMIN: 2.1 g/dL — AB (ref 3.5–5.0)
ALT: 13 U/L — AB (ref 14–54)
AST: 19 U/L (ref 15–41)
Alkaline Phosphatase: 73 U/L (ref 38–126)
Anion gap: 8 (ref 5–15)
BILIRUBIN TOTAL: 0.2 mg/dL — AB (ref 0.3–1.2)
BUN: 11 mg/dL (ref 6–20)
CHLORIDE: 101 mmol/L (ref 101–111)
CO2: 26 mmol/L (ref 22–32)
Calcium: 8.3 mg/dL — ABNORMAL LOW (ref 8.9–10.3)
Creatinine, Ser: 0.87 mg/dL (ref 0.44–1.00)
GFR calc Af Amer: >60 mL/min (ref >60)
GFR calc non Af Amer: 58 mL/min — ABNORMAL LOW (ref >60)
GLUCOSE: 104 mg/dL — AB (ref 65–99)
POTASSIUM: 3.5 mmol/L (ref 3.5–5.1)
Sodium: 135 mmol/L (ref 135–145)
Total Protein: 5.7 g/dL — ABNORMAL LOW (ref 6.5–8.1)

## 2015-05-05 MED ORDER — CAMPHOR-MENTHOL 0.5-0.5 % EX LOTN
TOPICAL_LOTION | CUTANEOUS | Status: DC | PRN
  Filled 2015-05-05: qty 222

## 2015-05-05 NOTE — Progress Notes (Addendum)
Patient ID: Katherine Cooke, female   DOB: January 14, 1926, 81 y.o.   MRN: [id number]    Subjective: Patient states she feels better today. Sitting up in bed and eating breakfast.  Objective: Vital signs in last 24 hours: Temp:  [98.1 F (36.7 C)-98.7 F (37.1 C)] 98.7 F (37.1 C) (11/30 0610) Pulse Rate:  [96-100] 100 (11/30 0610) Resp:  [18] 18 (11/30 0610) BP: (137-158)/(59-72) 139/65 mmHg (11/30 0610) SpO2:  [93 %-98 %] 93 % (11/30 0610) Last BM Date: 05/03/15  Intake/Output from previous day: 11/29 0701 - 11/30 0700 In: 2023.8 [P.O.:300; I.V.:1623.8; IV Piggyback:100] Out: -  Intake/Output this shift:    General appearance: alert, cooperative and no distress GI: normal findings: soft, non-tender  Lab Results:   Recent Labs  05/03/15 0510  WBC 10.9*  HGB 9.2*  HCT 28.5*  PLT 489*   BMET  Recent Labs  05/03/15 0510  NA 136  K 3.8  CL 105  CO2 25  GLUCOSE 92  BUN 8  CREATININE 0.72  CALCIUM 8.4*     Studies/Results: Ct Abdomen Pelvis W Contrast  05/04/2015  CLINICAL DATA:  Weakness, ovaginal bloody discharge. Evaluate for possible abscess versus tumor. Patient has known colon tumor and refused surgery. EXAM: CT ABDOMEN AND PELVIS WITH CONTRAST TECHNIQUE: Multidetector CT imaging of the abdomen and pelvis was performed using the standard protocol following bolus administration of intravenous contrast. CONTRAST:  58m OMNIPAQUE IOHEXOL 300 MG/ML  SOLN COMPARISON:  April 30, 2015 FINDINGS: The previously noted left hepatic masses are again identified, largest measuring 3 cm suspicious for metastasis unchanged. The spleen, pancreas, gallbladder and adrenal glands are normal. Left renal cyst is unchanged. There is mild to moderate left hydroureteronephrosis unchanged. There is atherosclerosis of the abdominal aorta without aneurysmal dilatation. There is no small bowel obstruction. There is lymphadenopathy in the retroperitoneum largest measures 2 cm at the level of mid  pole kidneys. There is irregular mass in the sigmoid colon measuring at least 9.2 cm in length. Persistent lateral aspect of the sigmoid mass is a collection of air-fluid level measuring 2.8 cm. The mass is inseparable from the uterus. There are 2 foci of air within the uterine cavity and cervical canal. If the patient has not had recent instrumentation in the cervix and uterus, the findings are suspicious for fistula from adjacent colon mass. There are minimal bilateral pleural effusions. Minimal atelectasis of the lung bases are noted. There is a 4 mm nodule in the posterior basal segment of the right lower lobe. Degenerative joint changes of the spine are identified. 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 The previously noted collection at the lateral aspect of the sigmoid mass is persistent, focal walled off perforation from tumor necrosis is not excluded. There are foci of air identified within the cervix and the uterine cavity; if the patient has not had recent instrumentation in the cervix and uterus, the findings are suspicious for fistula from adjacent colon mass. Electronically Signed   By: WAbelardo DieselM.D.   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 I reviewed the surgical options again today in detail with the patient. This would include attempt at resection of the main tumor mass with colostomy versus  diverting colostomy to improve drainage from her colovaginal fistula versus nonsurgical treatment with hospice care and symptom management. We discussed and she understands that at age 75 with a recent stroke she would be at high risk for major surgery. No therapy will be curative and she understands this. Currently her symptoms are actually quite minimal with manageable drainage and no obstruction. After discussion she would prefer hospice care with symptom management only and I believe in this situation this is most reasonable. I would consider follow-up with medical oncology to see if there may be any role for palliative oral therapy. For now no surgical treatment planned unless there is a additional specific problem we can address. We will sign off and remain available.     LOS: 5 days    Cris Gibby T 05/05/2015   Of note is her repeat CT scan shows no change in air and fluid collection adjacent to sigmoid colon. Her signs of infection have improved. This likely represents necrotic tumor which may have had some infectious component that is pretty improved with antibiotics. Would consider completing a course of antibiotics as oral outpatient therapy for another week or so.  Edward Jolly MD, FACS  05/05/2015, 9:04 AM

## 2015-05-05 NOTE — Progress Notes (Signed)
ANTICOAGULATION CONSULT NOTE - Follow up  Pharmacy Consult for enoxaparin Indication: VTE treatment  Allergies  Allergen Reactions  . Lyrica [Pregabalin] Shortness Of Breath    Patient Measurements: Height: '5\' 1"'$  (154.9 cm) Weight: 105 lb 9.6 oz (47.9 kg) IBW/kg (Calculated) : 47.8   Vital Signs: Temp: 98.7 F (37.1 C) (11/30 0610) Temp Source: Oral (11/30 0610) BP: 139/65 mmHg (11/30 0610) Pulse Rate: 100 (11/30 0610)  Labs:  Recent Labs  05/03/15 0510  HGB 9.2*  HCT 28.5*  PLT 489*  CREATININE 0.72    Estimated Creatinine Clearance: 37.4 mL/min (by C-G formula based on Cr of 0.72).   Medical History: History reviewed. No pertinent past medical history. Assessment: 81yo F on Lovenox 1.'5mg'$ /kg sq C62Y PTA for hx embolic CVA. Resumed on admission at '1mg'$ /kg q24h d/t elevated SCr and CrCl < 30 ml/min. SCr improved with CrCl > 30 ml/min on 11/27 and dose increased to 1.5 mg/kg q24h. Also on ASA '81mg'$ /day as PTA. No further bleeding reported.  -Last SCr 0.72 on 11/28 with CrCl ~ 37 ml/min CG -Last CBC 11/28: Hgb low, but stable, Pltc elevated -No bleeding reported/documented  Goal of Therapy:  Anti-Xa level 0.6-1 units/ml 4hrs after LMWH dose given Monitor platelets by anticoagulation protocol: Yes   Plan:   Continue Lovenox '70mg'$  (1.5 mg/kg) SQ q24h.   BMET, CBC in AM.  Monitor closely for s/s of bleeding.   Lindell Spar, PharmD, BCPS Pager: [phone] 05/05/2015 7:57 AM

## 2015-05-05 NOTE — Progress Notes (Signed)
PT Cancellation Note  Patient Details Name: KAROLYNN INFANTINO MRN: [id number] DOB: [birth year]   Cancelled Treatment:    Reason Eval/Treat Not Completed: Other (comment) (pt reporting itching all over which is quite bothersome, she's had medication for it but doesn't feel up to walking right now and declined PT. Will follow. )   Philomena Doheny 05/05/2015, 11:43 AM [phone]

## 2015-05-05 NOTE — Progress Notes (Signed)
Date: May 05, 2015 Chart reviewed for concurrent status and case management needs. Will continue to follow patient for changes and needs: Velva Harman, RN, BSN, [place]   [phone]

## 2015-05-05 NOTE — Progress Notes (Signed)
Progress Note   JENAVEE LAGUARDIA IWL:[id number] DOB: 1925-07-09 DOA: 04/30/2015 PCP: Idamae Schuller, MD   Brief Narrative:   Katherine Cooke is an 81 y.o. 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 She also had abdominal itching and topical anti inching creams did not provide much symptomatic relief.  On admission, CT scan showed a new air-fluid level that measures about 2.8 cm adjacent to the previous sigmoid mass which was suspected to be an abscess. Surgery was consulted and recommended antibiotics and possible IR intervention however pt declined any surgical intervention. Repeat CT scan done 05/04/15.  Assessment/Plan:   Principal Problem: Intra-abdominal abscess ([place]) in the setting of colon adenocarcinoma with liver metastasis, colovesical fistula / Leukocytosis - CT scan on the admission demonstrated irregular mass in the sigmoid colon about 7.5 cm in the length with collection of air fluid level about 2.8 cm highly suspicious for necrobiosis or abscess. There is also a suggestion of fistulous communication as the mass is in the close proximity with the vagina. Also seen was progression in size of metastatic lesion in left hepatic lobe measures 3 cm and probable 2 other lesions in the left hepatic lobe. No small bowel obstruction. - Patient has declined surgical intervention. If needed to have drainage for the abscess she is agreeable to this. - We will continue Zosyn for now. - Repeat CT scan done 05/04/15: No significant changes. Surgery following. - Palliative care has been consulted for goals of care, appreciate their input as well. -  Suspect patient's condition will deteriorate rapidly with discontinuation of antibiotics.  - As such, would recommend residential hospice placement for symptom  control.  Active Problems: Acute renal failure / left hydronephrosis and hydroureter - Acute renal failure likely multifactorial with acute infection, dehydration and obstructive uropathy contributory. - Cr normalized with fluids.  Anemia of chronic disease - Due to history of colon adenocarcinoma - No reports of bleeding - Hemoglobin stable in the 9-10 range, no current indication for transfusion.  Multifocal acute infarct affecting the cerebellum, left paramedian pons, left thalamus and left posterior parietal and occipital regions  - Diagnosed in September 2016. - Continue daily aspirin for secondary stroke prevention , but low threshold to discontinue for any recurrent bleeding issues.  Moderate protein calorie malnutrition - In the context of malignancy, chronic illness. - Seen by nutritionist. - Continue nutritional supplementation.    DVT Prophylaxis - Lovenox ordered.   Family Communication/Anticipated D/C date and plan/Code Status   Family Communication:  Son at the bedside. Disposition Plan:  [facility] with hospice vs. Residential hospice. Anticipated D/C date:    1-2 days depending on symptom control. Code Status: DO NOT RESUSCITATE   IV Access:    Peripheral IV   Procedures and diagnostic studies:   Dg Chest 2 View  04/30/2015  CLINICAL DATA:  Shortness of breath for 1 week.  Fatigue. EXAM: CHEST  2 VIEW COMPARISON:  02/22/2015 FINDINGS: The patient is rotated to the right on today's radiograph, reducing diagnostic sensitivity and specificity. Mild enlargement of the cardiopericardial silhouette without edema. No pleural effusion. Calcified mitral valve. Atherosclerotic abdominal aorta.  Thoracic spondylosis. IMPRESSION: 1. Stable mild enlargement of the cardiopericardial silhouette, without edema. Calcified mitral valve. 2. Thoracic spondylosis. 3. No acute findings. Electronically Signed   By: Van Clines M.D.   On:  04/30/2015 18:21   Ct Abdomen  Pelvis W Contrast  05/04/2015  CLINICAL DATA:  Weakness, ovaginal bloody discharge. Evaluate for possible abscess versus tumor. Patient has known colon tumor and refused surgery. EXAM: CT ABDOMEN AND PELVIS WITH CONTRAST TECHNIQUE: Multidetector CT imaging of the abdomen and pelvis was performed using the standard protocol following bolus administration of intravenous contrast. CONTRAST:  36m OMNIPAQUE IOHEXOL 300 MG/ML  SOLN COMPARISON:  April 30, 2015 FINDINGS: The previously noted left hepatic masses are again identified, largest measuring 3 cm suspicious for metastasis unchanged. The spleen, pancreas, gallbladder and adrenal glands are normal. Left renal cyst is unchanged. There is mild to moderate left hydroureteronephrosis unchanged. There is atherosclerosis of the abdominal aorta without aneurysmal dilatation. There is no small bowel obstruction. There is lymphadenopathy in the retroperitoneum largest measures 2 cm at the level of mid pole kidneys. There is irregular mass in the sigmoid colon measuring at least 9.2 cm in length. Persistent lateral aspect of the sigmoid mass is a collection of air-fluid level measuring 2.8 cm. The mass is inseparable from the uterus. There are 2 foci of air within the uterine cavity and cervical canal. If the patient has not had recent instrumentation in the cervix and uterus, the findings are suspicious for fistula from adjacent colon mass. There are minimal bilateral pleural effusions. Minimal atelectasis of the lung bases are noted. There is a 4 mm nodule in the posterior basal segment of the right lower lobe. Degenerative joint changes of the spine are identified. 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 The previously noted collection at the lateral aspect of the sigmoid mass is persistent, focal walled off perforation from tumor necrosis is not excluded. There are foci of  air identified within the cervix and the uterine cavity; if the patient has not had recent instrumentation in the cervix and uterus, the findings are suspicious for fistula from adjacent colon mass. Electronically Signed   By: WAbelardo DieselM.D.   On: 05/04/2015 15:27   Ct Abdomen Pelvis W Contrast  04/30/2015  CLINICAL DATA:  Abdominal pain, leukocytosis, rectal and vaginal bleeding, history of colon cancer EXAM: CT ABDOMEN AND PELVIS WITH CONTRAST TECHNIQUE: Multidetector CT imaging of the abdomen and pelvis was performed using the standard protocol following bolus administration of intravenous contrast. CONTRAST:  240mOMNIPAQUE IOHEXOL 300 MG/ML SOLN, 8018mMNIPAQUE IOHEXOL 300 MG/ML SOLN COMPARISON:  02/13/2015 FINDINGS: Sagittal images of the spine shows degenerative changes lumbar spine. Mild for valve calcifications are again noted. Extensive atherosclerotic calcifications of abdominal aorta, splenic artery and bilateral iliac arteries. No aortic aneurysm. There is progression in size of low-density lesion in left hepatic lobe measures 3 cm. This is highly suspicious for metastatic disease. A second lesion in left hepatic lobe laterally measures 1.6 cm. No calcified gallstones are noted within gallbladder. The pancreas, spleen and adrenal glands are unremarkable. There is mild left hydronephrosis and left hydroureter. Mild delay excretion of the left ureter suspicious for mild obstructive uropathy. No small bowel obstruction.  No free abdominal air. There is no pericecal inflammation. The terminal ileum is unremarkable. Again noted irregular mass in the sigmoid colon measures at least 7.5 cm in length. In the lateral aspect of the mass there is a collection with air-fluid level measures about 2.8 cm. This is highly suspicious for necrobiosis or abscess. The mass is in close proximity with the vagina. I cannot exclude a fistulous communication. Correlation  with barium enema and GYN exam is recommended.  The urinary bladder is unremarkable. Multiple sigmoid colon diverticula are noted. Again noted a cyst in upper pole of the left kidney medial aspect measures 2.4 cm. There is a third lesion in left hepatic lobe axial image 21 measures 1 cm. IMPRESSION: 1. Again noted irregular mass in the sigmoid colon measures at least 7.5 cm in length. In the lateral aspect of the mass there is a collection with air-fluid level measures about 2.8 cm. This is highly suspicious for necrobiosis or abscess. The mass is in close proximity with the vagina. I cannot exclude a fistulous communication. Correlation with barium enema and GYN exam is recommended. 2. There is mild left hydronephrosis and left hydroureter. Mild delayed excretion on the left ureter. 3. Progression in size of metastatic lesion in left hepatic lobe measures 3 cm. There is probable a second lesion left hepatic lobe measures 1.6 cm. Additional lesion in left hepatic lobe measures 1 cm. 4. No small bowel obstruction. These results were called by telephone at the time of interpretation on 04/30/2015 at 10:44 pm to Dr. Blanchie Dessert , who verbally acknowledged these results. Electronically Signed   By: Lahoma Crocker M.D.   On: 04/30/2015 22:44    Medical Consultants:     Palliative Care: Micheline Rough, MD   General Surgery: Stark Klein, MD  Anti-Infectives:    Zosyn 04/30/15--->   Subjective:    Juanetta Beets is lethargic. Her son tells me that she has been complaining of pruritic skin. She has no appetite and has not been eating in the last several days.  Objective:    Filed Vitals:   05/04/15 0624 05/04/15 1500 05/04/15 2205 05/05/15 0610  BP: 147/59 137/59 158/72 139/65  Pulse: 93 98 96 100  Temp: 99 F (37.2 C) 98.1 F (36.7 C) 98.4 F (36.9 C) 98.7 F (37.1 C)  TempSrc: Oral Oral Oral Oral  Resp: '18 18 18 18  '$ Height:      Weight:      SpO2: 96% 93% 98% 93%    Intake/Output Summary (Last 24 hours) at 05/05/15 0754 Last data  filed at 05/05/15 0439  Gross per 24 hour  Intake 2023.75 ml  Output      0 ml  Net 2023.75 ml   Filed Weights   05/01/15 0032  Weight: 47.9 kg (105 lb 9.6 oz)    Exam: Gen:  Lethargic Cardiovascular:   Mildly tachycardic, No M/R/G Respiratory:  Lungs diminished Gastrointestinal:  Abdomen softly distended,  Mildly tender, positive bowel sounds Extremities:  No C/E/C   Data Reviewed:    Labs: Basic Metabolic Panel:  Recent Labs Lab 04/30/15 1520 05/01/15 0516 05/02/15 0510 05/03/15 0510  NA 134* 134* 137 136  K 4.6 4.1 3.9 3.8  CL 101 103 107 105  CO2 '26 23 23 25  '$ GLUCOSE 95 94 88 92  BUN '19 17 10 8  '$ CREATININE 1.32* 1.01* 0.82 0.72  CALCIUM 9.1 8.3* 8.5* 8.4*   GFR Estimated Creatinine Clearance: 37.4 mL/min (by C-G formula based on Cr of 0.72). Liver Function Tests:  Recent Labs Lab 04/30/15 1520  AST 20  ALT 11*  ALKPHOS 85  BILITOT 0.3  PROT 6.8  ALBUMIN 2.8*   CBC:  Recent Labs Lab 04/30/15 1520 05/01/15 0516 05/02/15 0510 05/03/15 0510  WBC 24.7* 15.6* 13.1* 10.9*  NEUTROABS 20.8*  --   --   --   HGB 10.0* 9.0* 9.4* 9.2*  HCT  30.8* 28.0* 29.2* 28.5*  MCV 96.3 96.2 95.7 95.0  PLT 537* 461* 481* 489*   CBG:  Recent Labs Lab 05/03/15 0807  GLUCAP 82   Sepsis Labs:  Recent Labs Lab 04/30/15 1520 04/30/15 1537 05/01/15 0516 05/02/15 0510 05/03/15 0510  WBC 24.7*  --  15.6* 13.1* 10.9*  LATICACIDVEN  --  1.12  --   --   --    Microbiology No results found for this or any previous visit (from the past 240 hour(s)).   Medications:   . acetaminophen  500 mg Oral BID WC  . aspirin EC  81 mg Oral Daily  . cycloSPORINE  1 drop Both Eyes BID  . docusate sodium  200 mg Oral QHS  . enoxaparin (LOVENOX) injection  1.5 mg/kg Subcutaneous Q24H  . feeding supplement (ENSURE ENLIVE)  237 mL Oral TID BM  . fluticasone  1 spray Each Nare Daily  . montelukast  10 mg Oral QHS  . piperacillin-tazobactam (ZOSYN)  IV  3.375 g Intravenous  3 times per day  . polyethylene glycol  17 g Oral Daily  . saccharomyces boulardii  250 mg Oral BID  . senna  1 tablet Oral Daily  . triamcinolone cream  1 application Topical BID   Continuous Infusions: . sodium chloride 75 mL/hr at 05/04/15 1835    Time spent: 35 minutes with > 50% of time discussing current diagnostic test results, clinical impression and plan of care with the patient's son who is having a difficult time with her deterioration and the probability of her impending death.   LOS: 5 days   [facility] Pager [phone]. If unable to reach me by pager, please call my cell phone at [phone].  *Please refer to [email or web address], password [email or web address] to get updated schedule on who will round on this patient, as hospitalists switch teams weekly. If 7PM-7AM, please contact night-coverage at [email or web address], password [email or web address] for any overnight needs.  05/05/2015, 7:54 AM

## 2015-05-06 DIAGNOSIS — R1900 Intra-abdominal and pelvic swelling, mass and lump, unspecified site: Secondary | ICD-10-CM

## 2015-05-06 LAB — CBC
HCT: 30.2 % — ABNORMAL LOW (ref 36.0–46.0)
Hemoglobin: 9.9 g/dL — ABNORMAL LOW (ref 12.0–15.0)
MCH: 31.5 pg (ref 26.0–34.0)
MCHC: 32.8 g/dL (ref 30.0–36.0)
MCV: 96.2 fL (ref 78.0–100.0)
PLATELETS: 595 10*3/uL — AB (ref 150–400)
RBC: 3.14 MIL/uL — AB (ref 3.87–5.11)
RDW: 14.8 % (ref 11.5–15.5)
WBC: 16.7 10*3/uL — AB (ref 4.0–10.5)

## 2015-05-06 LAB — BASIC METABOLIC PANEL
Anion gap: 8 (ref 5–15)
BUN: 9 mg/dL (ref 6–20)
CALCIUM: 8.6 mg/dL — AB (ref 8.9–10.3)
CHLORIDE: 102 mmol/L (ref 101–111)
CO2: 26 mmol/L (ref 22–32)
CREATININE: 0.8 mg/dL (ref 0.44–1.00)
GFR calc Af Amer: >60 mL/min (ref >60)
Glucose, Bld: 89 mg/dL (ref 65–99)
Potassium: 3.3 mmol/L — ABNORMAL LOW (ref 3.5–5.1)
Sodium: 136 mmol/L (ref 135–145)

## 2015-05-06 MED ORDER — ENOXAPARIN SODIUM 80 MG/0.8ML ~~LOC~~ SOLN: 70.0000 mg | SUBCUTANEOUS | Status: AC

## 2015-05-06 MED ORDER — POTASSIUM CHLORIDE CRYS ER 20 MEQ PO TBCR
20.0000 meq | EXTENDED_RELEASE_TABLET | Freq: Two times a day (BID) | ORAL | Status: DC
  Administered 2015-05-06: 20 meq via ORAL
  Filled 2015-05-06: qty 1

## 2015-05-06 MED ORDER — LORAZEPAM 0.5 MG PO TABS: 0.5000 mg | ORAL_TABLET | ORAL | Status: AC | PRN

## 2015-05-06 MED ORDER — LORAZEPAM 2 MG/ML IJ SOLN
INTRAMUSCULAR | Status: AC
  Administered 2015-05-06: 1 mg
  Filled 2015-05-06: qty 1

## 2015-05-06 MED ORDER — LORAZEPAM 2 MG/ML IJ SOLN
0.5000 mg | INTRAMUSCULAR | Status: DC | PRN
  Administered 2015-05-06: 0.5 mg via INTRAVENOUS
  Filled 2015-05-06 (×2): qty 1

## 2015-05-06 MED ORDER — POTASSIUM CHLORIDE 10 MEQ/100ML IV SOLN
10.0000 meq | INTRAVENOUS | Status: DC
  Administered 2015-05-06: 10 meq via INTRAVENOUS
  Filled 2015-05-06 (×4): qty 100

## 2015-05-06 MED ORDER — LORAZEPAM 2 MG/ML IJ SOLN: 1.0000 mg | INTRAMUSCULAR | Status: DC | PRN

## 2015-05-06 MED ORDER — LORAZEPAM 2 MG/ML IJ SOLN
1.0000 mg | Freq: Once | INTRAMUSCULAR | Status: AC
  Administered 2015-05-06: 1 mg via INTRAVENOUS

## 2015-05-06 NOTE — Progress Notes (Signed)
Katherine Cooke to be D/C'd Skilled nursing facility per MD order.  Discussed prescriptions and follow up appointments with the patient. Prescriptions given to patient, medication list explained in detail. Pt verbalized understanding.    Medication List    STOP taking these medications        ASPIRIN LOW DOSE 81 MG EC tablet  Generic drug:  aspirin      TAKE these medications        acetaminophen 500 MG tablet  Commonly known as:  TYLENOL  Take 1 tablet (500 mg total) by mouth 2 (two) times daily with breakfast and lunch.     cycloSPORINE 0.05 % ophthalmic emulsion  Commonly known as:  RESTASIS  Place 1 drop into both eyes 2 (two) times daily.     diphenhydrAMINE 12.5 MG/5ML elixir  Commonly known as:  BENADRYL  Take 5 mLs (12.5 mg total) by mouth every 8 (eight) hours as needed for itching.     docusate sodium 100 MG capsule  Commonly known as:  COLACE  Take 2 capsules (200 mg total) by mouth at bedtime.     enoxaparin 80 MG/0.8ML injection  Commonly known as:  LOVENOX  Inject 0.7 mLs (70 mg total) into the skin daily.     EYE VITAMINS PO  Take 1 tablet by mouth daily.     feeding supplement (ENSURE ENLIVE) Liqd  Take 237 mLs by mouth 2 (two) times daily between meals.     fluticasone 50 MCG/ACT nasal spray  Commonly known as:  FLONASE  Place 1 spray into both nostrils daily.     HYDROcodone-acetaminophen 5-325 MG tablet  Commonly known as:  NORCO/VICODIN  Take 1 tablet by mouth every 6 (six) hours as needed for moderate pain.     hydrOXYzine 10 MG tablet  Commonly known as:  ATARAX/VISTARIL  Take 10 mg by mouth 3 (three) times daily as needed for itching.     LORazepam 0.5 MG tablet  Commonly known as:  ATIVAN  Take 1 tablet (0.5 mg total) by mouth every 4 (four) hours as needed for anxiety (itching).     montelukast 10 MG tablet  Commonly known as:  SINGULAIR  Take 10 mg by mouth at bedtime.     ondansetron 4 MG tablet  Commonly known as:  ZOFRAN  Take 1  tablet (4 mg total) by mouth every 6 (six) hours as needed for nausea.     polyethylene glycol packet  Commonly known as:  MIRALAX / GLYCOLAX  Take 17 g by mouth daily.     polyvinyl alcohol 1.4 % ophthalmic solution  Commonly known as:  LIQUIFILM TEARS  Place 1 drop into both eyes as needed for dry eyes.     saccharomyces boulardii 250 MG capsule  Commonly known as:  FLORASTOR  Take 1 capsule (250 mg total) by mouth 2 (two) times daily.     senna 8.6 MG Tabs tablet  Commonly known as:  SENOKOT  Take 1 tablet by mouth daily.     triamcinolone cream 0.1 %  Commonly known as:  KENALOG  Apply 1 application topically 2 (two) times daily.     Turmeric 500 MG Caps  Take 1 tablet by mouth daily.        Filed Vitals:   05/05/15 2123 05/06/15 0547  BP: 139/56 149/66  Pulse: 97 91  Temp: 99.7 F (37.6 C) 98.4 F (36.9 C)  Resp: 19 19    Skin clean, dry and intact  without evidence of skin break down, no evidence of skin tears noted. IV catheter discontinued intact. Site without signs and symptoms of complications. Dressing and pressure applied. Pt denies pain at this time. No complaints noted.  An After Visit Summary was printed and given to the patient. Patient escorted via [place], and D/C home via private auto.  [name] 05/06/2015 4:29 PM

## 2015-05-06 NOTE — Progress Notes (Signed)
Date: May 06, 2015 Chart reviewed for concurrent status and case management needs. Will continue to follow patient for changes and needs:  [facility] notified of the patients discharge to home. Velva Harman, RN, BSN, [place]   [phone]

## 2015-05-06 NOTE — Progress Notes (Signed)
PT Cancellation Note  Patient Details Name: Katherine Cooke MRN: [id number] DOB: 07/26/1925   Cancelled Treatment:     pt plans to D/C to [facility] today   Nathanial Rancher 05/06/2015, 3:00 PM

## 2015-05-06 NOTE — Progress Notes (Signed)
Pt to be d/c today to [facility].   Pt and family agreeable. Confirmed plans with facility.  Plan transfer via EMS.    Pete Pelt [facility]  [phone]

## 2015-05-06 NOTE — Progress Notes (Signed)
Daily Progress Note   Patient Name: Katherine Cooke       Date: 05/06/2015 DOB: Oct 15, 1925  Age: 81 y.o. MRN#: [id number] Attending Physician: Venetia Maxon Rama, MD Primary Care Physician: Idamae Schuller, MD Admit Date: 04/30/2015  Reason for Consultation/Follow-up: Establishing goals of care  Subjective:  -discussed patient with Dr Rockne Menghini and she believes this patient will rapidly decline without current medical intervention support (hydration, antibiotics and electrolyte correction).  She spoke to family regarding hospice facility.  -met at bedside with son and his husband regarding diagnosis, prognosis, GOC and EOL wishes  -family and patient understand the limited prognosis and open to full comfort approach on discharge      Length of Stay: 6 days  Current Medications: Scheduled Meds:  . acetaminophen  500 mg Oral BID WC  . aspirin EC  81 mg Oral Daily  . cycloSPORINE  1 drop Both Eyes BID  . docusate sodium  200 mg Oral QHS  . enoxaparin (LOVENOX) injection  1.5 mg/kg Subcutaneous Q24H  . feeding supplement (ENSURE ENLIVE)  237 mL Oral TID BM  . fluticasone  1 spray Each Nare Daily  . montelukast  10 mg Oral QHS  . piperacillin-tazobactam (ZOSYN)  IV  3.375 g Intravenous 3 times per day  . polyethylene glycol  17 g Oral Daily  . potassium chloride  10 mEq Intravenous Q1 Hr x 4  . saccharomyces boulardii  250 mg Oral BID  . senna  1 tablet Oral Daily  . triamcinolone cream  1 application Topical BID    Continuous Infusions: . sodium chloride 75 mL/hr at 05/05/15 1908    PRN Meds: camphor-menthol, diphenhydrAMINE, HYDROcodone-acetaminophen, hydrOXYzine, LORazepam, morphine injection, ondansetron **OR** ondansetron (ZOFRAN) IV, polyvinyl alcohol  Physical  Exam: Physical Exam  General: Alert, awake, in no acute distress.  HEENT: No bruits, no goiter, no JVD Heart: Regular rate and rhythm. No murmur appreciated. Lungs: Good air movement, clear Abdomen: Soft, nontender, nondistended, positive bowel sounds.  Ext: No significant edema Skin: Warm and dry Neuro: Grossly intact, nonfocal.           Vital Signs: BP 149/66 mmHg  Pulse 91  Temp(Src) 98.4 F (36.9 C) (Oral)  Resp 19  Ht $R'5\' 1"'bl$  (1.549 m)  Wt 47.9 kg (105 lb 9.6 oz)  BMI 19.96 kg/m2  SpO2 98% SpO2: SpO2: 98 % O2 Device: O2 Device: Not Delivered O2 Flow Rate:    Intake/output summary:   Intake/Output Summary (Last 24 hours) at 05/06/15 1123 Last data filed at 05/05/15 1815  Gross per 24 hour  Intake 1213.75 ml  Output      0 ml  Net 1213.75 ml   LBM: Last BM Date: 05/05/15 Baseline Weight: Weight: 47.9 kg (105 lb 9.6 oz) Most recent weight: Weight: 47.9 kg (105 lb 9.6 oz)       Palliative Assessment/Data: Flowsheet Rows        Most Recent Value   Intake Tab    Referral Department  Hospitalist   Unit at Time of Referral  Med/Surg Unit   Palliative Care Primary Diagnosis  Cancer   Date Notified  05/01/15   Palliative Care Type  New Palliative care   Reason for referral  Clarify Goals of Care, Counsel Regarding Hospice   Date of Admission  04/30/15   Date first seen by Palliative Care  05/01/15   # of days Palliative referral response time  0 Day(s)   # of days IP prior to Palliative referral  1   Clinical Assessment    Palliative Performance Scale Score  60%   Pain Max last 24 hours  5   Pain Min Last 24 hours  0   Psychosocial & Spiritual Assessment    Palliative Care Outcomes    Patient/Family meeting held?  Yes   Palliative Care Outcomes  ACP counseling assistance, Counseled regarding hospice      Additional Data Reviewed: CBC    Component Value Date/Time   WBC 16.7* 05/06/2015 0550   RBC 3.14* 05/06/2015 0550   HGB 9.9* 05/06/2015 0550   HCT  30.2* 05/06/2015 0550   PLT 595* 05/06/2015 0550   MCV 96.2 05/06/2015 0550   MCH 31.5 05/06/2015 0550   MCHC 32.8 05/06/2015 0550   RDW 14.8 05/06/2015 0550   LYMPHSABS 2.4 04/30/2015 1520   MONOABS 1.3* 04/30/2015 1520   EOSABS 0.1 04/30/2015 1520   BASOSABS 0.1 04/30/2015 1520    CMP     Component Value Date/Time   NA 136 05/06/2015 0550   K 3.3* 05/06/2015 0550   CL 102 05/06/2015 0550   CO2 26 05/06/2015 0550   GLUCOSE 89 05/06/2015 0550   BUN 9 05/06/2015 0550   CREATININE 0.80 05/06/2015 0550   CALCIUM 8.6* 05/06/2015 0550   PROT 5.7* 05/05/2015 1445   ALBUMIN 2.1* 05/05/2015 1445   AST 19 05/05/2015 1445   ALT 13* 05/05/2015 1445   ALKPHOS 73 05/05/2015 1445   BILITOT 0.2* 05/05/2015 1445   GFRNONAA >60 05/06/2015 0550   GFRAA >60 05/06/2015 0550       Problem List:  Patient Active Problem List   Diagnosis Date Noted  . Hydronephrosis and hydroureter of left kidney 05/05/2015  . Liver metastases ([place]) 05/04/2015  . Malnutrition of moderate degree 05/04/2015  . Counseling regarding goals of care   . Palliative care encounter   . Colovesical fistula 05/01/2015  . AKI (acute kidney injury) ([place]) 05/01/2015  . Intra-abdominal abscess ([place]) 04/30/2015  . Adenocarcinoma of colon ([place]) 03/01/2015  . Cerebrovascular accident (CVA) involving cerebellum ([place]) 02/17/2015  . Anemia of chronic disease 02/15/2015  . Leukocytosis      Palliative Care Assessment & Plan    1.Code Status:  DNR    Code Status Orders        Start  Ordered   05/02/15 0844  Do not attempt resuscitation (DNR)   Continuous    Question Answer Comment  In the event of cardiac or respiratory ARREST Do not call a "code blue"   In the event of cardiac or respiratory ARREST Do not perform Intubation, CPR, defibrillation or ACLS   In the event of cardiac or respiratory ARREST Use medication by any route, position, wound care, and other measures to relive pain and suffering. May use  oxygen, suction and manual treatment of airway obstruction as needed for comfort.      05/02/15 0844       2. Goals of Care/Additional Recommendations:   Comfort, quality and dignity are focus of care  Hopeful for hospice facility, mention [facility]  Once discharged, no further diagnostics, lab draws, artificial feeding or hydration    4. Palliative Prophylaxis:   Bowel Regimen and Delirium Protocol  5. Prognosis: Less than 2 weeks once shifted to comfort (WBC climbing, poor po intake, metastatic disease to liver)  6. Discharge Planning:  Hopeful for hospice facility   Care plan was discussed wpatient, her son, and case management Thank you for allowing the Palliative Medicine Team to assist in the care of this patient.   Time In: 1030 Time Out: 1130 Total Time  60 min Prolonged Time Billed no        Knox Royalty, NP  05/06/2015, 11:23 AM  Please contact Palliative Medicine Team phone at [phone] for questions and concerns.

## 2015-05-06 NOTE — Consult Note (Signed)
[facility] room available for Ms. Libman today. Met with son [name] to complete registration paper work. Dr. Orpah Melter to assume care per [facility] preference. Discharge summary has been faxed.  RN please call report to [phone].  Thank you. Erling Conte, [place]

## 2015-05-06 NOTE — Discharge Summary (Signed)
Physician Discharge Summary  Katherine Cooke:[id number] DOB: [birth year] DOA: 04/30/2015  PCP: Idamae Schuller, MD  Admit date: 04/30/2015 Discharge date: 05/06/2015   Recommendations for Outpatient Follow-Up:   1. The patient is being discharged to a residential hospice facility for end of life care.   Discharge Diagnosis:   Principal Problem:    Intra-abdominal abscess ([place]) versus necrotic colonic tumor Active Problems:    Leukocytosis    Anemia of chronic disease    Cerebrovascular accident (CVA) involving cerebellum ([place])    Adenocarcinoma of colon ([place])    Colovesical fistula    AKI (acute kidney injury) ([place])    Liver metastases ([place])    Malnutrition of moderate degree    Counseling regarding goals of care    Palliative care encounter    Hydronephrosis and hydroureter of left kidney    Mass in the abdomen   Discharge disposition:  [facility].  Discharge Condition: Terminal.  Diet recommendation: Comfort feeds as tolerated.   History of Present Illness:   Katherine Cooke is an 81 y.o. 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 She also had abdominal itching and topical anti inching creams did not provide much symptomatic relief.  On admission, CT scan showed a new air-fluid level that measures about 2.8 cm adjacent to the previous sigmoid mass which was suspected to be an abscess. Surgery was consulted and recommended antibiotics and possible IR intervention however pt declined any surgical intervention. Repeat CT scan done 05/04/15 without change.   Hospital Course by Problem:   Principal Problem: Intra-abdominal abscess ([place]) in the setting of colon adenocarcinoma with liver metastasis, colovesical fistula / Leukocytosis - CT scan on the  admission demonstrated irregular mass in the sigmoid colon about 7.5 cm in the length with collection of air fluid level about 2.8 cm highly suspicious for necrobiosis or abscess. There is also a suggestion of fistulous communication as the mass is in the close proximity with the vagina. Also seen was progression in size of metastatic lesion in left hepatic lobe measures 3 cm and probable 2 other lesions in the left hepatic lobe. No small bowel obstruction. - Patient declined surgical intervention. No discrete abscess for drainage based on 2 CT scans. - Treated with Zosyn while in the hospital.  - Repeat CT scan done 05/04/15: No significant changes. Surgery following. - Palliative care following with goals of care established. - Suspect patient's condition will deteriorate rapidly with discontinuation of antibiotics.  - As such, would recommend residential hospice placement for symptom control.  Active Problems: Acute renal failure / left hydronephrosis and hydroureter - Acute renal failure likely multifactorial with acute infection, dehydration and obstructive uropathy contributory. - Cr normalized with fluids.  Anemia of chronic disease - Due to history of colon adenocarcinoma - No reports of bleeding - Hemoglobin stable in the 9-10 range, no current indication for transfusion.  Multifocal acute infarct affecting the cerebellum, left paramedian pons, left thalamus and left posterior parietal and occipital regions  - Diagnosed in September 2016.  Thought to be embolic, possibly related to hypercoagulable state from malignancy. - Neurologist recommended therapeutic dose Lovenox, reasonable to continue until condition warrants discontinuation. - Can D/C aspirin while on Lovenox.  Moderate protein calorie malnutrition - In the context of malignancy, chronic illness. - Seen by nutritionist. - Continue nutritional supplementation.  Medical Consultants:    None.   Discharge Exam:     Filed Vitals:   05/05/15 2123 05/06/15 0547  BP: 139/56 149/66  Pulse: 97 91  Temp: 99.7 F (37.6 C) 98.4 F (36.9 C)  Resp: 19 19   Filed Vitals:   05/05/15 0610 05/05/15 1436 05/05/15 2123 05/06/15 0547  BP: 139/65 109/55 139/56 149/66  Pulse: 100 92 97 91  Temp: 98.7 F (37.1 C) 98.9 F (37.2 C) 99.7 F (37.6 C) 98.4 F (36.9 C)  TempSrc: Oral Oral Oral Oral  Resp: '18 18 19 19  '$ Height:      Weight:      SpO2: 93% 96% 96% 98%    Gen:  NAD Cardiovascular:  RRR, No M/R/G Respiratory: Lungs CTAB Gastrointestinal: Abdomen soft, NT/ND with normal active bowel sounds. Extremities: No C/E/C   The results of significant diagnostics from this hospitalization (including imaging, microbiology, ancillary and laboratory) are listed below for reference.     Procedures and Diagnostic Studies:   Dg Chest 2 View  04/30/2015  CLINICAL DATA:  Shortness of breath for 1 week.  Fatigue. EXAM: CHEST  2 VIEW COMPARISON:  02/22/2015 FINDINGS: The patient is rotated to the right on today's radiograph, reducing diagnostic sensitivity and specificity. Mild enlargement of the cardiopericardial silhouette without edema. No pleural effusion. Calcified mitral valve. Atherosclerotic abdominal aorta.  Thoracic spondylosis. IMPRESSION: 1. Stable mild enlargement of the cardiopericardial silhouette, without edema. Calcified mitral valve. 2. Thoracic spondylosis. 3. No acute findings. Electronically Signed   By: Van Clines M.D.   On: 04/30/2015 18:21   Ct Abdomen Pelvis W Contrast  04/30/2015  CLINICAL DATA:  Abdominal pain, leukocytosis, rectal and vaginal bleeding, history of colon cancer EXAM: CT ABDOMEN AND PELVIS WITH CONTRAST TECHNIQUE: Multidetector CT imaging of the abdomen and pelvis was performed using the standard protocol following bolus administration of intravenous contrast. CONTRAST:  27m OMNIPAQUE IOHEXOL 300 MG/ML SOLN, 887mOMNIPAQUE IOHEXOL 300 MG/ML SOLN COMPARISON:   02/13/2015 FINDINGS: Sagittal images of the spine shows degenerative changes lumbar spine. Mild for valve calcifications are again noted. Extensive atherosclerotic calcifications of abdominal aorta, splenic artery and bilateral iliac arteries. No aortic aneurysm. There is progression in size of low-density lesion in left hepatic lobe measures 3 cm. This is highly suspicious for metastatic disease. A second lesion in left hepatic lobe laterally measures 1.6 cm. No calcified gallstones are noted within gallbladder. The pancreas, spleen and adrenal glands are unremarkable. There is mild left hydronephrosis and left hydroureter. Mild delay excretion of the left ureter suspicious for mild obstructive uropathy. No small bowel obstruction.  No free abdominal air. There is no pericecal inflammation. The terminal ileum is unremarkable. Again noted irregular mass in the sigmoid colon measures at least 7.5 cm in length. In the lateral aspect of the mass there is a collection with air-fluid level measures about 2.8 cm. This is highly suspicious for necrobiosis or abscess. The mass is in close proximity with the vagina. I cannot exclude a fistulous communication. Correlation with barium enema and GYN exam is recommended. The urinary bladder is unremarkable. Multiple sigmoid colon diverticula are noted. Again noted a cyst in upper pole of the left kidney medial aspect measures 2.4 cm. There is a third lesion in left hepatic lobe axial image 21 measures 1 cm. IMPRESSION: 1. Again noted irregular mass in the sigmoid colon measures at least 7.5 cm in length. In the lateral aspect of the mass there is a collection with air-fluid level  measures about 2.8 cm. This is highly suspicious for necrobiosis or abscess. The mass is in close proximity with the vagina. I cannot exclude a fistulous communication. Correlation with barium enema and GYN exam is recommended. 2. There is mild left hydronephrosis and left hydroureter. Mild delayed  excretion on the left ureter. 3. Progression in size of metastatic lesion in left hepatic lobe measures 3 cm. There is probable a second lesion left hepatic lobe measures 1.6 cm. Additional lesion in left hepatic lobe measures 1 cm. 4. No small bowel obstruction. These results were called by telephone at the time of interpretation on 04/30/2015 at 10:44 pm to Dr. Blanchie Dessert , who verbally acknowledged these results. Electronically Signed   By: Lahoma Crocker M.D.   On: 04/30/2015 22:44     Labs:   Basic Metabolic Panel:  Recent Labs Lab 05/01/15 0516 05/02/15 0510 05/03/15 0510 05/05/15 1445 05/06/15 0550  NA 134* 137 136 135 136  K 4.1 3.9 3.8 3.5 3.3*  CL 103 107 105 101 102  CO2 '23 23 25 26 26  '$ GLUCOSE 94 88 92 104* 89  BUN '17 10 8 11 9  '$ CREATININE 1.01* 0.82 0.72 0.87 0.80  CALCIUM 8.3* 8.5* 8.4* 8.3* 8.6*   GFR Estimated Creatinine Clearance: 37.4 mL/min (by C-G formula based on Cr of 0.8). Liver Function Tests:  Recent Labs Lab 04/30/15 1520 05/05/15 1445  AST 20 19  ALT 11* 13*  ALKPHOS 85 73  BILITOT 0.3 0.2*  PROT 6.8 5.7*  ALBUMIN 2.8* 2.1*   No results for input(s): LIPASE, AMYLASE in the last 168 hours. No results for input(s): AMMONIA in the last 168 hours. Coagulation profile No results for input(s): INR, PROTIME in the last 168 hours.  CBC:  Recent Labs Lab 04/30/15 1520 05/01/15 0516 05/02/15 0510 05/03/15 0510 05/06/15 0550  WBC 24.7* 15.6* 13.1* 10.9* 16.7*  NEUTROABS 20.8*  --   --   --   --   HGB 10.0* 9.0* 9.4* 9.2* 9.9*  HCT 30.8* 28.0* 29.2* 28.5* 30.2*  MCV 96.3 96.2 95.7 95.0 96.2  PLT 537* 461* 481* 489* 595*   CBG:  Recent Labs Lab 05/03/15 0807  GLUCAP 82     Discharge Instructions:   Discharge Instructions    Call MD for:  persistant nausea and vomiting    Complete by:  As directed      Call MD for:  severe uncontrolled pain    Complete by:  As directed      Diet general    Complete by:  As directed       Increase activity slowly    Complete by:  As directed             Medication List    TAKE these medications        acetaminophen 500 MG tablet  Commonly known as:  TYLENOL  Take 1 tablet (500 mg total) by mouth 2 (two) times daily with breakfast and lunch.     ASPIRIN LOW DOSE 81 MG EC tablet  Generic drug:  aspirin  Take 81 mg by mouth daily.     cycloSPORINE 0.05 % ophthalmic emulsion  Commonly known as:  RESTASIS  Place 1 drop into both eyes 2 (two) times daily.     diphenhydrAMINE 12.5 MG/5ML elixir  Commonly known as:  BENADRYL  Take 5 mLs (12.5 mg total) by mouth every 8 (eight) hours as needed for itching.     docusate sodium 100  MG capsule  Commonly known as:  COLACE  Take 2 capsules (200 mg total) by mouth at bedtime.     enoxaparin 80 MG/0.8ML injection  Commonly known as:  LOVENOX  Inject 0.7 mLs (70 mg total) into the skin daily.     EYE VITAMINS PO  Take 1 tablet by mouth daily.     feeding supplement (ENSURE ENLIVE) Liqd  Take 237 mLs by mouth 2 (two) times daily between meals.     fluticasone 50 MCG/ACT nasal spray  Commonly known as:  FLONASE  Place 1 spray into both nostrils daily.     HYDROcodone-acetaminophen 5-325 MG tablet  Commonly known as:  NORCO/VICODIN  Take 1 tablet by mouth every 6 (six) hours as needed for moderate pain.     hydrOXYzine 10 MG tablet  Commonly known as:  ATARAX/VISTARIL  Take 10 mg by mouth 3 (three) times daily as needed for itching.     LORazepam 0.5 MG tablet  Commonly known as:  ATIVAN  Take 1 tablet (0.5 mg total) by mouth every 4 (four) hours as needed for anxiety (itching).     montelukast 10 MG tablet  Commonly known as:  SINGULAIR  Take 10 mg by mouth at bedtime.     ondansetron 4 MG tablet  Commonly known as:  ZOFRAN  Take 1 tablet (4 mg total) by mouth every 6 (six) hours as needed for nausea.     polyethylene glycol packet  Commonly known as:  MIRALAX / GLYCOLAX  Take 17 g by mouth daily.      polyvinyl alcohol 1.4 % ophthalmic solution  Commonly known as:  LIQUIFILM TEARS  Place 1 drop into both eyes as needed for dry eyes.     saccharomyces boulardii 250 MG capsule  Commonly known as:  FLORASTOR  Take 1 capsule (250 mg total) by mouth 2 (two) times daily.     senna 8.6 MG Tabs tablet  Commonly known as:  SENOKOT  Take 1 tablet by mouth daily.     triamcinolone cream 0.1 %  Commonly known as:  KENALOG  Apply 1 application topically 2 (two) times daily.     Turmeric 500 MG Caps  Take 1 tablet by mouth daily.           Follow-up Information    Follow up with [facility], [place].   Specialty:  Internal Medicine   Why:  As needed   Contact information:   [address] [place] [address] [phone]        Time coordinating discharge: 35 minutes.  Signed:  Hadessah Grennan  Pager [phone] [facility] 05/06/2015, 2:04 PM

## 2015-05-12 NOTE — Telephone Encounter (Signed)
error 

## 2015-06-06 DEATH — deceased

## 2015-06-09 ENCOUNTER — Encounter: Payer: Self-pay | Admitting: Internal Medicine

## 2015-06-09 NOTE — Progress Notes (Signed)
Patient's so Katherine Cooke called inquiring about a copay paid on 03/22/15 by [employer] and his mom did not have the receipt for nor did [employer] pay. I looked in the closing statements folder for that day and did not see a payment for her on that day for $40 copay. Called son back to let him know and he thanked me for looking and thought they must not have asked for it that day because she always pays.

## 2015-07-13 ENCOUNTER — Ambulatory Visit: Payer: Medicare Other | Admitting: Neurology

## 2015-07-19 ENCOUNTER — Telehealth: Payer: Self-pay | Admitting: Obstetrics and Gynecology

## 2015-07-19 NOTE — Telephone Encounter (Signed)
Phone call from son, Albertina Parr, to notifiy [place] of Mrs. Schewe passing. Mrs. Daniele passed on 06-05-15.  Upcoming AEX cancelled.  Pt status has not been changed in EPIC.

## 2015-07-22 NOTE — Telephone Encounter (Signed)
Okay to change status in EPIC or is other follow up needed?

## 2016-01-24 ENCOUNTER — Ambulatory Visit: Payer: Medicare Other | Admitting: Obstetrics and Gynecology

## 2017-07-04 IMAGING — CT CT ANGIO NECK
1 of 12 series · 1 of 33 positions shown · IV contrast (Iohexol (Omnipaque 350))
Comparison: MRI of the brain 02/17/2015.

CLINICAL DATA: Stroke with cerebral ischemia. Difficulty speaking.
Recent diagnosis of colon cancer.

EXAM:
CT ANGIOGRAPHY HEAD AND NECK
TECHNIQUE: Multidetector CT imaging of the head and neck was performed using
the standard protocol during bolus administration of intravenous
contrast. Multiplanar CT image reconstructions and MIPs were
obtained to evaluate the vascular anatomy. Carotid stenosis
measurements (when applicable) are obtained utilizing NASCET
criteria, using the distal internal carotid diameter as the
denominator.
CONTRAST:  50mL OMNIPAQUE IOHEXOL 350 MG/ML SOLN

[Series 300: locator · axial · 0.49mm/px · 1 of 1 slices shown]
[im 1/1  soft-tissue]
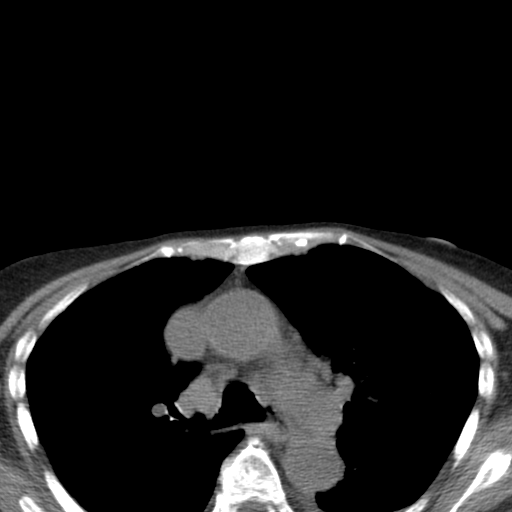

[1 of 33 positions shown; findings below may reference images not displayed]

FINDINGS: CT HEAD

Brain: The acute left thalamic infarct is less well seen by CT. Mild
periventricular white matter changes are again noted bilaterally. No
acute cortical infarct or hemorrhage is present. The ventricles are
stable. No significant extra-axial fluid collection is present.
Acute and remote lacunar infarcts are again noted in the cerebellum.

Calvarium and skull base: Within normal limits.

Paranasal sinuses: Cleared

Orbits: Bilateral lens replacements are noted. The globes and orbits
are otherwise intact.

CTA NECK

Aortic arch: Atherosclerotic calcifications are present at the
origins of the left common carotid artery and the left subclavian
artery without significant stenosis. Minimal calcification is
present along the undersurface of the aortic arch.

Right carotid system: Moderate tortuosity is present in the right
common carotid artery without significant stenosis. Dense
calcifications are present at the right carotid bifurcation. There
is mild narrowing, less than 50% relative to the more distal vessel.
The cervical right ICA is also tortuous without significant
stenosis.

Left carotid system: The left common carotid artery is tortuous
without significant stenosis. Dense atherosclerotic calcifications
are present at the carotid bifurcation without significant stenosis.
There is significant tortuosity of the cervical left ICA without
significant stenosis.

Vertebral arteries:A 50% stenosis is present in the right subclavian
artery just beyond the bifurcation of the carotid artery. Dense
calcifications are present at the origins of the vertebral arteries
bilaterally. Mild to moderate stenoses are worse on the right. There
is significant tortuosity of the proximal vertebral arteries
bilaterally without additional significant stenoses in the cervical
vertebral arteries. The left vertebral artery is slightly dominant
to the right.

Skeleton: Bone windows demonstrate multilevel endplate degenerative
change. There is chronic loss of height at C5-6 and C6-7. Grade 1
anterolisthesis is degenerative at C3-4 and C4-5. No focal lytic or
blastic lesions are present.

Other neck: Mild dependent atelectasis is present at the lung
apices. No significant cervical adenopathy is present. Salivary
glands are within normal limits. The thyroid is unremarkable.

CTA HEAD

Anterior circulation: There is mild ectasia of the internal carotid
arteries bilaterally from the high cervical segments through the ICA
termini. The A1 and M1 segments are within normal limits. The
anterior communicating artery is patent. MCA bifurcations are intact
bilaterally. There is some attenuation of distal MCA branch vessels
bilaterally without significant proximal stenosis or occlusion.

Posterior circulation: The left vertebral artery is the dominant
vessel. PICA origins are visualized and within normal limits. The
basilar artery is within normal limits. Both posterior cerebral
arteries are of fetal type with small contribution from the P1
segments.

Venous sinuses: The dural sinuses are patent. The straight sinus and
deep cerebral veins are patent.

Anatomic variants: Bilateral fetal type posterior cerebral arteries
with small P1 segments.

Delayed phase: No pathologic enhancement is present.
IMPRESSION: 1. Acute/subacute infarcts of the left thalamus and right
cerebellum.
2. Moderate stenoses at the origins of the vertebral arteries
bilaterally, worse on the right.
3. Tortuosity of the vertebral arteries without other focal
stenoses.
4. The posterior cerebral arteries are of fetal type with a small P1
segments.
5. Marked tortuosity of the common and internal carotid arteries
bilaterally without significant stenoses.
6. Dense atherosclerotic calcifications at the carotid bifurcations
bilaterally without significant stenosis.
7. Multilevel spondylosis of the cervical spine.
